# Patient Record
Sex: Female | Born: 1980 | Race: Black or African American | Hispanic: No | Marital: Married | Smoking: Former smoker
Health system: Southern US, Community
[De-identification: ages and names within clinical notes are randomized; demographics above are authoritative.]

## PROBLEM LIST (undated history)

## (undated) ENCOUNTER — Inpatient Hospital Stay (HOSPITAL_COMMUNITY): Payer: Self-pay

## (undated) ENCOUNTER — Emergency Department (HOSPITAL_COMMUNITY): Discharge status: Absent without leave | Payer: No Typology Code available for payment source

## (undated) DIAGNOSIS — R87619 Unspecified abnormal cytological findings in specimens from cervix uteri: Secondary | ICD-10-CM

## (undated) DIAGNOSIS — R519 Headache, unspecified: Secondary | ICD-10-CM

## (undated) DIAGNOSIS — G8929 Other chronic pain: Secondary | ICD-10-CM

## (undated) DIAGNOSIS — D649 Anemia, unspecified: Secondary | ICD-10-CM

## (undated) DIAGNOSIS — G43909 Migraine, unspecified, not intractable, without status migrainosus: Secondary | ICD-10-CM

## (undated) DIAGNOSIS — E162 Hypoglycemia, unspecified: Secondary | ICD-10-CM

## (undated) DIAGNOSIS — N12 Tubulo-interstitial nephritis, not specified as acute or chronic: Secondary | ICD-10-CM

## (undated) DIAGNOSIS — D573 Sickle-cell trait: Secondary | ICD-10-CM

## (undated) DIAGNOSIS — R51 Headache: Secondary | ICD-10-CM

## (undated) DIAGNOSIS — L0591 Pilonidal cyst without abscess: Secondary | ICD-10-CM

## (undated) HISTORY — PX: OTHER SURGICAL HISTORY: SHX169

## (undated) HISTORY — DX: Pilonidal cyst without abscess: L05.91

## (undated) HISTORY — PX: CERVICAL CONE BIOPSY: SUR198

---
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
## 2000-03-15 ENCOUNTER — Inpatient Hospital Stay (HOSPITAL_COMMUNITY): Admission: AD | Admit: 2000-03-15 | Discharge: 2000-03-15 | Payer: Self-pay | Admitting: Obstetrics

## 2000-03-15 ENCOUNTER — Encounter: Payer: Self-pay | Admitting: Obstetrics

## 2000-04-27 ENCOUNTER — Inpatient Hospital Stay (HOSPITAL_COMMUNITY): Admission: AD | Admit: 2000-04-27 | Discharge: 2000-04-27 | Payer: Self-pay | Admitting: Obstetrics & Gynecology

## 2000-06-26 ENCOUNTER — Inpatient Hospital Stay (HOSPITAL_COMMUNITY): Admission: AD | Admit: 2000-06-26 | Discharge: 2000-06-26 | Payer: Self-pay | Admitting: Obstetrics & Gynecology

## 2000-07-05 ENCOUNTER — Inpatient Hospital Stay (HOSPITAL_COMMUNITY): Admission: AD | Admit: 2000-07-05 | Discharge: 2000-07-05 | Payer: Self-pay | Admitting: Obstetrics

## 2000-11-11 ENCOUNTER — Inpatient Hospital Stay (HOSPITAL_COMMUNITY): Admission: AD | Admit: 2000-11-11 | Discharge: 2000-11-11 | Payer: Self-pay | Admitting: *Deleted

## 2001-12-19 ENCOUNTER — Emergency Department (HOSPITAL_COMMUNITY): Admission: EM | Admit: 2001-12-19 | Discharge: 2001-12-19 | Payer: Self-pay | Admitting: Emergency Medicine

## 2001-12-19 ENCOUNTER — Encounter: Payer: Self-pay | Admitting: Emergency Medicine

## 2002-02-13 ENCOUNTER — Emergency Department (HOSPITAL_COMMUNITY): Admission: EM | Admit: 2002-02-13 | Discharge: 2002-02-13 | Payer: Self-pay

## 2002-02-13 ENCOUNTER — Encounter: Payer: Self-pay | Admitting: Emergency Medicine

## 2003-10-29 ENCOUNTER — Emergency Department (HOSPITAL_COMMUNITY): Admission: EM | Admit: 2003-10-29 | Discharge: 2003-10-29 | Payer: Self-pay | Admitting: Family Medicine

## 2004-03-25 ENCOUNTER — Emergency Department (HOSPITAL_COMMUNITY): Admission: EM | Admit: 2004-03-25 | Discharge: 2004-03-25 | Payer: Self-pay | Admitting: Emergency Medicine

## 2005-03-20 ENCOUNTER — Emergency Department (HOSPITAL_COMMUNITY): Admission: EM | Admit: 2005-03-20 | Discharge: 2005-03-20 | Payer: Self-pay | Admitting: Emergency Medicine

## 2005-04-19 ENCOUNTER — Emergency Department (HOSPITAL_COMMUNITY): Admission: EM | Admit: 2005-04-19 | Discharge: 2005-04-19 | Payer: Self-pay | Admitting: Emergency Medicine

## 2006-05-23 HISTORY — PX: DILATION AND CURETTAGE OF UTERUS: SHX78

## 2006-08-11 ENCOUNTER — Emergency Department (HOSPITAL_COMMUNITY): Admission: EM | Admit: 2006-08-11 | Discharge: 2006-08-11 | Payer: Self-pay | Admitting: Emergency Medicine

## 2006-08-14 ENCOUNTER — Emergency Department (HOSPITAL_COMMUNITY): Admission: EM | Admit: 2006-08-14 | Discharge: 2006-08-14 | Payer: Self-pay | Admitting: *Deleted

## 2006-08-24 ENCOUNTER — Emergency Department (HOSPITAL_COMMUNITY): Admission: EM | Admit: 2006-08-24 | Discharge: 2006-08-24 | Payer: Self-pay | Admitting: Emergency Medicine

## 2006-08-27 ENCOUNTER — Emergency Department (HOSPITAL_COMMUNITY): Admission: EM | Admit: 2006-08-27 | Discharge: 2006-08-27 | Payer: Self-pay | Admitting: Emergency Medicine

## 2006-08-31 ENCOUNTER — Emergency Department (HOSPITAL_COMMUNITY): Admission: EM | Admit: 2006-08-31 | Discharge: 2006-08-31 | Payer: Self-pay | Admitting: Emergency Medicine

## 2006-09-03 ENCOUNTER — Emergency Department (HOSPITAL_COMMUNITY): Admission: EM | Admit: 2006-09-03 | Discharge: 2006-09-03 | Payer: Self-pay | Admitting: Emergency Medicine

## 2008-04-10 ENCOUNTER — Ambulatory Visit: Payer: Self-pay | Admitting: Obstetrics and Gynecology

## 2008-04-10 ENCOUNTER — Inpatient Hospital Stay (HOSPITAL_COMMUNITY): Admission: AD | Admit: 2008-04-10 | Discharge: 2008-04-12 | Payer: Self-pay | Admitting: Obstetrics and Gynecology

## 2009-05-23 HISTORY — PX: LEEP: SHX91

## 2010-10-22 HISTORY — PX: OTHER SURGICAL HISTORY: SHX169

## 2010-10-22 HISTORY — PX: PILONIDAL CYST EXCISION: SHX744

## 2011-02-22 LAB — CBC
HCT: 30 — ABNORMAL LOW
HCT: 34.2 — ABNORMAL LOW
Hemoglobin: 10.1 — ABNORMAL LOW
Hemoglobin: 11.1 — ABNORMAL LOW
MCHC: 32.4
MCHC: 33.7
MCV: 94.1
MCV: 94.5
Platelets: 194
Platelets: 207
RBC: 3.18 — ABNORMAL LOW
RBC: 3.63 — ABNORMAL LOW
RDW: 14.3
RDW: 14.5
WBC: 11 — ABNORMAL HIGH
WBC: 9.7

## 2011-02-22 LAB — RPR: RPR Ser Ql: NONREACTIVE

## 2011-04-13 ENCOUNTER — Emergency Department (HOSPITAL_COMMUNITY)

## 2011-04-13 ENCOUNTER — Inpatient Hospital Stay (HOSPITAL_COMMUNITY)
Admission: EM | Admit: 2011-04-13 | Discharge: 2011-04-14 | DRG: 779 | Disposition: A | Discharge status: Home / Self Care | Attending: Obstetrics & Gynecology | Admitting: Obstetrics & Gynecology

## 2011-04-13 ENCOUNTER — Other Ambulatory Visit: Payer: Self-pay | Admitting: Obstetrics and Gynecology

## 2011-04-13 ENCOUNTER — Encounter (HOSPITAL_COMMUNITY): Payer: Self-pay

## 2011-04-13 ENCOUNTER — Inpatient Hospital Stay (HOSPITAL_COMMUNITY)

## 2011-04-13 DIAGNOSIS — O42912 Preterm premature rupture of membranes, unspecified as to length of time between rupture and onset of labor, second trimester: Secondary | ICD-10-CM

## 2011-04-13 DIAGNOSIS — O2690 Pregnancy related conditions, unspecified, unspecified trimester: Secondary | ICD-10-CM

## 2011-04-13 DIAGNOSIS — O039 Complete or unspecified spontaneous abortion without complication: Principal | ICD-10-CM | POA: Diagnosis present

## 2011-04-13 DIAGNOSIS — O4290 Premature rupture of membranes, unspecified as to length of time between rupture and onset of labor, unspecified weeks of gestation: Secondary | ICD-10-CM

## 2011-04-13 HISTORY — DX: Anemia, unspecified: D64.9

## 2011-04-13 HISTORY — DX: Tubulo-interstitial nephritis, not specified as acute or chronic: N12

## 2011-04-13 HISTORY — DX: Hypoglycemia, unspecified: E16.2

## 2011-04-13 HISTORY — DX: Unspecified abnormal cytological findings in specimens from cervix uteri: R87.619

## 2011-04-13 HISTORY — DX: Other chronic pain: G89.29

## 2011-04-13 HISTORY — DX: Headache: R51

## 2011-04-13 HISTORY — DX: Headache, unspecified: R51.9

## 2011-04-13 LAB — BASIC METABOLIC PANEL
BUN: 5 mg/dL — ABNORMAL LOW (ref 6–23)
CO2: 24 mEq/L (ref 19–32)
Chloride: 98 mEq/L (ref 96–112)
Creatinine, Ser: 0.51 mg/dL (ref 0.50–1.10)
GFR calc Af Amer: >90 mL/min (ref >90)
Glucose, Bld: 67 mg/dL — ABNORMAL LOW (ref 70–99)
Potassium: 3.8 mEq/L (ref 3.5–5.1)

## 2011-04-13 LAB — TYPE AND SCREEN
ABO/RH(D): O POS
Antibody Screen: NEGATIVE

## 2011-04-13 LAB — WET PREP, GENITAL
Clue Cells Wet Prep HPF POC: NONE SEEN
Yeast Wet Prep HPF POC: NONE SEEN

## 2011-04-13 LAB — CBC
HCT: 35.4 % — ABNORMAL LOW (ref 36.0–46.0)
Hemoglobin: 12.3 g/dL (ref 12.0–15.0)
MCH: 31 pg (ref 26.0–34.0)
MCHC: 34.7 g/dL (ref 30.0–36.0)
Platelets: 260 10*3/uL (ref 150–400)
RBC: 3.97 MIL/uL (ref 3.87–5.11)
RDW: 13.3 % (ref 11.5–15.5)
WBC: 16.2 10*3/uL — ABNORMAL HIGH (ref 4.0–10.5)

## 2011-04-13 LAB — URINALYSIS, ROUTINE W REFLEX MICROSCOPIC
Glucose, UA: NEGATIVE mg/dL
Hgb urine dipstick: NEGATIVE
Ketones, ur: NEGATIVE mg/dL
Nitrite: NEGATIVE
Protein, ur: NEGATIVE mg/dL
Specific Gravity, Urine: 1.013 (ref 1.005–1.030)
Urobilinogen, UA: 0.2 mg/dL (ref 0.0–1.0)
pH: 7 (ref 5.0–8.0)

## 2011-04-13 LAB — URINE MICROSCOPIC-ADD ON

## 2011-04-13 LAB — DIFFERENTIAL
Basophils Absolute: 0 10*3/uL (ref 0.0–0.1)
Basophils Relative: 0 % (ref 0–1)
Eosinophils Absolute: 0 10*3/uL (ref 0.0–0.7)
Eosinophils Relative: 0 % (ref 0–5)
Lymphocytes Relative: 8 % — ABNORMAL LOW (ref 12–46)
Lymphs Abs: 1.4 10*3/uL (ref 0.7–4.0)
Monocytes Absolute: 0.6 10*3/uL (ref 0.1–1.0)
Neutro Abs: 14.2 10*3/uL — ABNORMAL HIGH (ref 1.7–7.7)
Neutrophils Relative %: 88 % — ABNORMAL HIGH (ref 43–77)

## 2011-04-13 LAB — RAPID URINE DRUG SCREEN, HOSP PERFORMED
Amphetamines: NOT DETECTED
Barbiturates: NOT DETECTED
Benzodiazepines: NOT DETECTED
Cocaine: NOT DETECTED
Opiates: NOT DETECTED
Tetrahydrocannabinol: NOT DETECTED

## 2011-04-13 MED ORDER — KETOROLAC TROMETHAMINE 30 MG/ML IJ SOLN
30.0000 mg | Freq: Once | INTRAMUSCULAR | Status: AC
  Administered 2011-04-13: 30 mg via INTRAVENOUS
  Filled 2011-04-13: qty 1

## 2011-04-13 MED ORDER — LACTATED RINGERS IV SOLN
500.0000 mL | Freq: Once | INTRAVENOUS | Status: AC
  Administered 2011-04-13: 500 mL via INTRAVENOUS

## 2011-04-13 MED ORDER — SODIUM CHLORIDE 0.9 % IV SOLN
1000.0000 mL | INTRAVENOUS | Status: AC
  Administered 2011-04-13: 1000 mL via INTRAVENOUS

## 2011-04-13 MED ORDER — AMPICILLIN SODIUM 2 G IJ SOLR
2.0000 g | Freq: Four times a day (QID) | INTRAMUSCULAR | Status: DC
  Administered 2011-04-13 (×2): 2 g via INTRAVENOUS
  Filled 2011-04-13 (×9): qty 2000

## 2011-04-13 MED ORDER — LIDOCAINE HCL (PF) 1 % IJ SOLN: 30.0000 mL | INTRAMUSCULAR | Status: DC | PRN

## 2011-04-13 MED ORDER — ONDANSETRON HCL 4 MG/2ML IJ SOLN: 4.0000 mg | Freq: Four times a day (QID) | INTRAMUSCULAR | Status: DC | PRN

## 2011-04-13 MED ORDER — LACTATED RINGERS IV SOLN: 500.0000 mL | INTRAVENOUS | Status: DC | PRN

## 2011-04-13 MED ORDER — LACTATED RINGERS IV SOLN
INTRAVENOUS | Status: DC
  Administered 2011-04-13: 20:00:00 via INTRAVENOUS

## 2011-04-13 MED ORDER — PHENYLEPHRINE 40 MCG/ML (10ML) SYRINGE FOR IV PUSH (FOR BLOOD PRESSURE SUPPORT): 80.0000 ug | PREFILLED_SYRINGE | INTRAVENOUS | Status: DC | PRN

## 2011-04-13 MED ORDER — MISOPROSTOL 200 MCG PO TABS
800.0000 ug | ORAL_TABLET | Freq: Once | ORAL | Status: AC
  Administered 2011-04-13: 800 ug via VAGINAL
  Filled 2011-04-13: qty 4

## 2011-04-13 MED ORDER — DIPHENHYDRAMINE HCL 50 MG/ML IJ SOLN: 12.5000 mg | INTRAMUSCULAR | Status: DC | PRN

## 2011-04-13 MED ORDER — FENTANYL 2.5 MCG/ML BUPIVACAINE 1/10 % EPIDURAL INFUSION (WH - ANES): 14.0000 mL/h | INTRAMUSCULAR | Status: DC

## 2011-04-13 MED ORDER — IBUPROFEN 600 MG PO TABS
600.0000 mg | ORAL_TABLET | Freq: Four times a day (QID) | ORAL | Status: DC | PRN
  Administered 2011-04-13: 600 mg via ORAL
  Filled 2011-04-13: qty 1

## 2011-04-13 MED ORDER — CITRIC ACID-SODIUM CITRATE 334-500 MG/5ML PO SOLN: 30.0000 mL | ORAL | Status: DC | PRN

## 2011-04-13 MED ORDER — OXYTOCIN 20 UNITS IN LACTATED RINGERS INFUSION - SIMPLE
125.0000 mL/h | Freq: Once | INTRAVENOUS | Status: AC
  Administered 2011-04-13: 125 mL/h via INTRAVENOUS

## 2011-04-13 MED ORDER — DEXTROSE 5 % IV SOLN
130.0000 mg | Freq: Three times a day (TID) | INTRAVENOUS | Status: DC
  Administered 2011-04-13 (×2): 130 mg via INTRAVENOUS
  Filled 2011-04-13 (×7): qty 3.25

## 2011-04-13 MED ORDER — SODIUM CHLORIDE 0.9 % IV BOLUS (SEPSIS)
1000.0000 mL | Freq: Once | INTRAVENOUS | Status: AC
  Administered 2011-04-13: 1000 mL via INTRAVENOUS

## 2011-04-13 MED ORDER — EPHEDRINE 5 MG/ML INJ: 10.0000 mg | INTRAVENOUS | Status: DC | PRN

## 2011-04-13 MED ORDER — PROMETHAZINE HCL 25 MG/ML IJ SOLN
12.5000 mg | INTRAMUSCULAR | Status: DC | PRN
  Administered 2011-04-13: 12.5 mg via INTRAVENOUS
  Filled 2011-04-13 (×2): qty 1

## 2011-04-13 MED ORDER — BUTORPHANOL TARTRATE 2 MG/ML IJ SOLN
1.0000 mg | Freq: Once | INTRAMUSCULAR | Status: DC
  Filled 2011-04-13: qty 1

## 2011-04-13 MED ORDER — OXYTOCIN BOLUS FROM INFUSION
500.0000 mL | Freq: Once | INTRAVENOUS | Status: DC
  Filled 2011-04-13: qty 1000
  Filled 2011-04-13: qty 500

## 2011-04-13 MED ORDER — MORPHINE SULFATE 4 MG/ML IJ SOLN
2.0000 mg | INTRAMUSCULAR | Status: DC | PRN
  Administered 2011-04-13 (×2): 2 mg via INTRAVENOUS
  Filled 2011-04-13 (×2): qty 1

## 2011-04-13 MED ORDER — ACETAMINOPHEN 325 MG PO TABS
650.0000 mg | ORAL_TABLET | ORAL | Status: DC | PRN
  Administered 2011-04-13: 650 mg via ORAL
  Filled 2011-04-13: qty 2

## 2011-04-13 NOTE — Progress Notes (Signed)
Received call from Diane RN at [facility] at [phone].  Arrived to [facility] at 1000.  Pt in room with Dr Fonnie Jarvis performing BSUS.  FHT 160's on [place].

## 2011-04-13 NOTE — Progress Notes (Signed)
Pt transferred to [facility]U via [facility] in stable condition.

## 2011-04-13 NOTE — ED Notes (Signed)
Patient will be transported to [facility]. [facility] had been called and OB Rapid RN is giving report to the [facility] RN.

## 2011-04-13 NOTE — ED Notes (Signed)
Patient has been transported to [facility] by [facility]

## 2011-04-13 NOTE — H&P (Signed)
Margaret Cameron is a 30 y.o. female presenting for Leaking fluid and severe abdominal pain. Maternal Medical History:  Reason for admission: Reason for admission: rupture of membranes and nausea.   This 30 year old female is gravida 2 para 1 with a prior term vaginal delivery who states her last menstrual period was the end of July with some vaginal spotting and tissue passage sometime in October and she thought she had a miscarriage after having a positive pregnancy test at the end of July, she now presents with several hours of constant clear discharge from her vaginal area with constant suprapubic and left lower quadrant abdominal pain without radiation or associated symptoms such as fever nausea chest pain cough or shortness of breath. She states in the last couple of months she has had a small amount of nonbloody vomiting and diarrhea every couple of days. She does have some dysuria today. She has no vaginal bleeding, syncope, or trauma. Her blood type is O+.   Past medical history of sickle cell trait   Patient was seen by Dr Fonnie Jarvis at [place] and subsequently transferred her per my recommendation, to confirm PPROM, which I suspected. [place] here reveals No fluid around baby and gestational age of 16.5 weeks.  Had an episode of bleeding in October and thought she had miscarried.    OB History    Grav Para Term Preterm Abortions TAB SAB Ect Mult Living   3 1 1  0 1 1 0 0 0 1     Past Medical History  Diagnosis Date  . Pyelonephritis     x 3 episodes  . Abnormal Pap smear of cervix     LEEP 2011  . Hypoglycemia   . Anemia     with pregnancy  . Chronic headaches    Past Surgical History  Procedure Date  . Dilation and curettage of uterus 2008  . Leep 2011  . Other surgical history     Pynlodial cyst removed from tail bone 2012   Family History: family history is not on file. Social History:  reports that she has been smoking Cigarettes.  She has been smoking about .3 packs per day. She  does not have any smokeless tobacco history on file. She reports that she does not drink alcohol or use illicit drugs. Just transferred from [employer] into [employer].  Review of Systems  Constitutional: Positive for fever, chills and malaise/fatigue.  Gastrointestinal: Positive for nausea and abdominal pain.  Neurological: Positive for weakness.      Blood pressure 108/55, pulse 102, temperature 98.4 F (36.9 C), temperature source Oral, resp. rate 20, height 5\' 6"  (1.676 m), last menstrual period 11/10/2010, SpO2 96.00%. Maternal Exam:  Uterine Assessment: Contraction strength is mild.  Abdomen: Patient reports generalized tenderness.  Fundal height is 17-18.   Fetal presentation: no presenting part  Introitus: Normal vulva. Vagina is positive for vaginal discharge.  Ferning test: not done.  Nitrazine test: not done. Amniotic fluid character: foul-smelling.  Pelvis: adequate for delivery.   Cervix: Cervix evaluated by digital exam.     Fetal Exam Fetal Monitor Review: Mode: ultrasound.       Physical Exam  Constitutional: She is oriented to person, place, and time. She appears well-developed and well-nourished. No distress.  HENT:  Head: Normocephalic.  Cardiovascular: Normal rate.   Respiratory: Effort normal.  GI: Soft. She exhibits no distension and no mass. There is generalized tenderness. There is guarding. There is no rebound.  Genitourinary: Uterus normal.  Vaginal discharge found.       No fluid in vagina, foul odor noted. Cervix closed/50%, cultures obtained.   Musculoskeletal: Normal range of motion.  Neurological: She is alert and oriented to person, place, and time.  Skin: Skin is warm and dry.  Psychiatric: She has a normal mood and affect.       Grieving appropriately   cultures obtained  Prenatal labs: ABO, Rh: --/--/O POS (11/21 1610) Antibody: NEG (11/21 0936) Rubella:   RPR:    HBsAg:    HIV:    GBS:     US shows Anhydramnios  with FHR 172.  Anatomy grossly normal  Assessment/Plan: A:  IUP at 16.5 weeks     PPROM P:  Consulted Dr Debroah Loop      Admit to [facility] for Cytotec Induction of labor      Amp and Gent IV antibiotics      Risks and benefits discussed with patient who agrees to proceed.  Wynelle Bourgeois 04/13/2011, 2:11 PM

## 2011-04-13 NOTE — Progress Notes (Signed)
Pt arrived by [facility] from [facility], pt C/O LLQ pain that she rates an 8, states it began last night & has become more intense today.  Pt also reports ? Gushes of water x 2, clear.

## 2011-04-13 NOTE — Progress Notes (Signed)
CNM Williams notified of lab results to include CBC and UA.  Orders received.

## 2011-04-13 NOTE — Progress Notes (Signed)
Educating pt on miscarriage and plan at delivery. Pt understands and has no questions.

## 2011-04-13 NOTE — ED Provider Notes (Signed)
History     CSN: [id number] Arrival date & time: 04/13/2011  8:55 AM   First MD Initiated Contact with Patient 04/13/11 0935      Chief Complaint  Patient presents with  . Abdominal Pain  . Possible Pregnancy  . Urinary Incontinence  . Rupture of Membranes    (Consider location/radiation/quality/duration/timing/severity/associated sxs/prior treatment) HPI This 30 year old female is gravida 2 para 1 with a prior term vaginal delivery who states her last menstrual period was the end of July with some vaginal spotting and tissue passage sometime in October and she thought she had a miscarriage after having a positive pregnancy test at the end of July, she now presents with several hours of constant clear discharge from her vaginal area with constant suprapubic and left lower quadrant abdominal pain without radiation or associated symptoms such as fever nausea chest pain cough or shortness of breath. She states in the last couple of months she has had a small amount of nonbloody vomiting and diarrhea every couple of days. She does have some dysuria today. She has no vaginal bleeding, syncope, or trauma. Her blood type is O+. Past Medical History  Diagnosis Date  . Pyelonephritis     x 3 episodes  . Abnormal Pap smear of cervix     LEEP 2011  . Hypoglycemia   . Anemia     with pregnancy  . Chronic headaches    Past medical history of sickle cell trait Past Surgical History  Procedure Date  . Dilation and curettage of uterus 2008  . Leep 2011  . Other surgical history     Pynlodial cyst removed from tail bone 2012    History reviewed. No pertinent family history.  History  Substance Use Topics  . Smoking status: Current Everyday Smoker -- 0.3 packs/day    Types: Cigarettes  . Smokeless tobacco: Not on file  . Alcohol Use: No    OB History    Grav Para Term Preterm Abortions TAB SAB Ect Mult Living   3 2 1  0 1 1 0 0 0 1      Review of Systems  Constitutional:  Negative for fever.       10 Systems reviewed and are negative for acute change except as noted in the HPI.  HENT: Negative for congestion.   Eyes: Negative for discharge and redness.  Respiratory: Negative for cough and shortness of breath.   Cardiovascular: Negative for chest pain.  Gastrointestinal: Positive for vomiting, abdominal pain and diarrhea.  Genitourinary: Positive for dysuria. Negative for vaginal bleeding.  Musculoskeletal: Negative for back pain.  Skin: Negative for rash.  Neurological: Negative for syncope, numbness and headaches.  Psychiatric/Behavioral:       No behavior change.    Allergies  Codeine and Septra  Home Medications   Current Outpatient Rx  Name Route Sig Dispense Refill  . IBUPROFEN 600 MG PO TABS Oral Take 1 tablet (600 mg total) by mouth every 6 (six) hours as needed for pain. Takes for pain 30 tablet 1  . PRENATAL VITAMINS 0.8 MG PO TABS Oral Take 1 tablet by mouth daily. 30 tablet 12    BP 91/52  Pulse 80  Temp(Src) 98.2 F (36.8 C) (Oral)  Resp 18  Ht 5\' 6"  (1.676 m)  Wt 158 lb 9.6 oz (71.94 kg)  BMI 25.60 kg/m2  SpO2 100%  LMP 11/10/2010  Physical Exam  Nursing note and vitals reviewed. Constitutional:       Awake, alert, nontoxic  appearance.  HENT:  Head: Atraumatic.  Eyes: Right eye exhibits no discharge. Left eye exhibits no discharge.  Neck: Neck supple.  Cardiovascular: Regular rhythm.   No murmur heard.      Tachycardic  Pulmonary/Chest: Effort normal and breath sounds normal. She exhibits no tenderness.  Abdominal: Soft. Bowel sounds are normal. There is tenderness. There is no rebound.       Mild to moderate suprapubic and left lower quadrant tenderness without rebound with limited bedside emergency department ultrasound revealing an intrauterine pregnancy with fetal heart rate 160, with transverse lie, with head position to the right side, with biparietal diameter estimated gestational age at approximately 18 weeks with  minimal amniotic fluid  Genitourinary:       Chaperone present for bimanual sterile glove examination with clear discharge with soft closed cervix with minimal tenderness without bleeding noted  Musculoskeletal: Normal range of motion. She exhibits no edema and no tenderness.       Baseline ROM, no obvious new focal weakness.  Neurological: She is alert.       Mental status and motor strength appears baseline for patient and situation.  Skin: No rash noted.  Psychiatric: She has a normal mood and affect.    ED Course  Procedures (including critical care time) The patient was evaluated by the OB rapid response nurse in the emergency department who communicated with Dr. Debroah Loop at [facility] who accepts the patient in transfer to the [facility] maternal admissions unit for further evaluation for suspected premature rupture of membranes with pregnancy complication with abdominal pain. The ultrasound was canceled for [facility] and will likely be performed at [facility]. Labs Reviewed  CBC - Abnormal; Notable for the following:    WBC 16.2 (*)    HCT 35.4 (*)    All other components within normal limits  DIFFERENTIAL - Abnormal; Notable for the following:    Neutrophils Relative 88 (*)    Neutro Abs 14.2 (*)    Lymphocytes Relative 8 (*)    All other components within normal limits  BASIC METABOLIC PANEL - Abnormal; Notable for the following:    Sodium 133 (*)    Glucose, Bld 67 (*)    BUN 5 (*)    All other components within normal limits  URINALYSIS, ROUTINE W REFLEX MICROSCOPIC - Abnormal; Notable for the following:    Leukocytes, UA SMALL (*)    All other components within normal limits  URINE MICROSCOPIC-ADD ON - Abnormal; Notable for the following:    Squamous Epithelial / LPF FEW (*)    Bacteria, UA FEW (*)    All other components within normal limits  WET PREP, GENITAL - Abnormal; Notable for the following:    Trich, Wet Prep FEW (*)    WBC, Wet  Prep HPF POC MODERATE (*) MODERATE BACTERIA SEEN   All other components within normal limits  TYPE AND SCREEN  ABO/RH  GC/CHLAMYDIA PROBE AMP, GENITAL  URINE RAPID DRUG SCREEN (HOSP PERFORMED)  LAB REPORT - SCANNED   US Ob Comp + 14 Wk  04/13/2011  OBSTETRICAL ULTRASOUND: This exam was performed within a [place] [facility] Department. The OB US report was generated in the [facility] system, and faxed to the ordering physician.   This report is also available in [employer] and in the [employer]. See [facility] Obstetric US report.     1. Preterm premature rupture of membranes in second trimester   2. Abdominal pain  3. Pregnancy complication       MDM  The patient appears reasonably stabilized for transfer considering the current resources, flow, and capabilities available in the ED at this time, and I doubt any other [facility] requiring further screening and/or treatment in the ED prior to transfer.        Hurman Horn, MD 04/14/11 [phone]

## 2011-04-13 NOTE — Progress Notes (Addendum)
G3P1.  No PNC.  Dating per pt's reported LMP of 11/10/2010, would be at 22.0 GA.  Fundal height is approx 17.5 cm upon measurement.  Dop tones are 160's.  Pt reports a prior  SVD at [redacted] wks GA with no complications and an TAB via D&C in 2008.  Pt reports no other OB med problems.  Pt has NPC this pregnancy.  Pt states she had a positive UPT in 11/2010.  In Pt states she had a large episode of painless bleeding where she passed multiple large golf ball size clots but no tissue in early October.  Pt reports she did not seek medical care for this episode of bleeding because she believed that she had had a complete miscarriage.  Pt states she had an episode where she had a large gush of clear vaginal fluid last night at around 2000.  She stated she did not leak thoughout the night, but  she did have several gushes this morning.  Pt states she has had constant, sharp LLQ pain that is stabbing in nature at 10/10 on PS.  She also states she has suprapubic "pressure like " pain that is constant in nature.  Upon palpation, pt's abdomen is soft, but there is diffuse tenderness throughout her abdomen.  Pt has positive CVA tenderness bilaterally upon percussion.  Pt feels warm to touch, but [facility] RN reports she was afebrile upon admission.  Tachycardia present in the 105-111 range.  Temp per RROB 100.8 oral, at this time.  Pt reports that she has had irregular bouts of dysuria for the past several days coupled with both frequency and hesitancy.  Pt currently demonstrating chills and has had an episode of emesis since OBRR arrived.  Pt states she has had nausea since October, but that this is the first time she has vomited today.  CCUA, CBC, and comp chem obtained and sent.  Upon visualization of perineum, no active bleeding or LOF noted.  Fluid present on pt's panties is Nitrazine positive.  Per dr Fonnie Jarvis, SVE closed and high.  SVE deferred by RN at this time.  Pt's med hx:  Frequent episodes of pyelo, h/o abnormal pap with LEEP,  h/o symptomatic hypoglycemia, h/o chronic HAs, h/o anemia with pregnancy.  See History scection for full details.

## 2011-04-13 NOTE — ED Notes (Signed)
Patient reports that she had positive pregnancy back in July and developed bleeding in august and thought she had miscarriage. Has now developed increased abdominal pain with bleeding and urinary incontinence. Took additional preg test that was positive

## 2011-04-13 NOTE — ED Notes (Signed)
Secondary Assessment: pt was got pregnant in August. Had 1 day of profusely bleeding with many clots in October but didn't seek medical attention due to lack of insurance coverage at that time. Pt suspected miscarriage. Recently pt took a pregnancy test and shown positive result. Yesterday pt started to have sharp left abd pain 10/10. No bleeding at this moment, also experiencing urinary incontinence since yesterday, also have nausea and vomiting.

## 2011-04-13 NOTE — Progress Notes (Addendum)
Spoke with CNM Mayford Knife regarding patient, as Dr Debroah Loop is unavailable at this time.  CNM Williams updated re pt presence, status, presenting complaint, OBRR assessment, VS, Dop tones and FHT on [place], SVE per Dr Fonnie Jarvis, previous episode of painless bldg in OCT, OB and med hx.  Order to transfer pt to [facility] for further evaluation received.  Dr Fonnie Jarvis, [facility], and [facility]U notified.

## 2011-04-13 NOTE — Brief Op Note (Signed)
*   No surgery found * Delivery Note At 8:44 PM a non-viable female was delivered via Vaginal, Spontaneous Delivery (Presentation: vertex;  ).  APGAR: 0, 0; weight . Baby was stillborn. Placenta was initially retained and after approximately 20 minutes expelled spontaneously and response to IV oxytocin  Placenta status: Intact, Spontaneous.  Cord:  with the following complications: .   Anesthesia iv analgesia :   Episiotomy: None Lacerations: None  Est. Blood Loss (mL): 250 cc  Mom to gyn floor. She will receive 2 more doses of antibiotic as already scheduled, gentamicin and 1 hour and sewing in 2 hours prior to discontinuation of this medication probable discharge to home  Baby to morgue, Family DEsires Autopsy. Baby viewed by patient and father of baby Tilda Burrow 04/13/2011, 10:31 PM

## 2011-04-13 NOTE — Progress Notes (Signed)
ANTIBIOTIC CONSULT NOTE - INITIAL  Pharmacy Consult for Gentamicin Indication: Chorioamnionitis, Maternal Fever, Endometritis  Allergies  Allergen Reactions  . Codeine Rash    Mild rash, took once with benadryl and NO rash, Took Morphine with NO side effect  . Septra (Bactrim) Rash    Patient Measurements: Height: 5\' 6"  (167.6 cm) IBW/kg (Calculated) : 59.3  Adjusted Body Weight: 62.6kg  Vital Signs: Temp: 98.4 F (36.9 C) (11/21 1153) Temp src: Oral (11/21 1153) BP: 108/55 mmHg (11/21 1208) Pulse Rate: 102  (11/21 1208)  Labs:  [facility] 04/13/11 0936  WBC 16.2*  HGB 12.3  PLT 260  LABCREA --  CREATININE 0.51  CRCLEARANCE --   No results found for this basename: GENTTROUGH:2,GENTPEAK:2,GENTRANDOM:2, in the last 72 hours   Microbiology: No results found for this or any previous visit (from the past 720 hour(s)).  Medications:  Ampicillin 2grams IV every 6 hours.  Assessment: Pt about [redacted] weeks pregnant with chorioamnionitis. Estimated Ke = 0.445 , Vd = 0.3L/Kg  Goal of Therapy:  Gentamicin peak 6-8 mg/L and Trough < 1 mg/L  Plan:  Gentamicin 130 mg IV every 8 hrs  Will check gentamicin levels if continued > 72hr or clinically indicated.  Berlin Hun D 04/13/2011,2:01 PM

## 2011-04-14 ENCOUNTER — Encounter (HOSPITAL_COMMUNITY): Payer: Self-pay | Admitting: *Deleted

## 2011-04-14 MED ORDER — PRENATAL VITAMINS 0.8 MG PO TABS: 1.0000 | ORAL_TABLET | Freq: Every day | ORAL | Status: AC

## 2011-04-14 MED ORDER — IBUPROFEN 600 MG PO TABS: 600.0000 mg | ORAL_TABLET | Freq: Four times a day (QID) | ORAL | Status: DC | PRN

## 2011-04-14 MED ORDER — SODIUM CHLORIDE 0.9 % IV SOLN
Freq: Every day | INTRAVENOUS | Status: DC | PRN
  Administered 2011-04-14: 01:00:00 via INTRAVENOUS

## 2011-04-14 MED ORDER — SODIUM CHLORIDE 0.9 % IV SOLN
2.0000 g | Freq: Four times a day (QID) | INTRAVENOUS | Status: AC
  Administered 2011-04-14: 2 g via INTRAVENOUS
  Filled 2011-04-14: qty 2000

## 2011-04-14 NOTE — Progress Notes (Signed)
Subjective: Patient reports tolerating PO, + flatus and no problems voiding.  Mild cramping pain  Objective: I have reviewed patient's vital signs and intake and output.  General: alert, cooperative and no distress Resp: clear to auscultation bilaterally Cardio: regular rate and rhythm GI: soft, non-tender; bowel sounds normal; no masses,  no organomegaly Extremities: no edema, redness or tenderness in the calves or thighs Lochia- min-moderate  Assessment/Plan: 30yo G3P1011 with PPROM at 16 wks s/p induced abortion - Patient hemodynamically stable for discharge - Discharge instructions provided  LOS: 1 day    Margaret Cameron 04/14/2011, 9:27 AM

## 2011-04-14 NOTE — Progress Notes (Signed)
Nursing: Pt denies chaplin services.

## 2011-04-14 NOTE — Discharge Summary (Signed)
Physician Discharge Summary  Patient ID: Celia Friedland MRN: [id number] DOB/AGE: 02-01-1981 30 y.o.  Admit date: 04/13/2011 Discharge date: 04/14/2011  Admission Diagnoses: 16wk PPROM  Discharge Diagnoses: same Active Problems:  Complete legally induced abortion   Discharged Condition: stable  Hospital Course: 30 yo G3P1011 at [redacted]w[redacted]d presented with PPROM and chorioamnionitis. Patient was counseled for need to induce delivery and received Cytotec per vagina. Patient delivered both fetus and placenta with complication. She received one dose of ampicillin and gentamycin. On PPD#1, patient was found to be stable for discharge. Discharge instructions were provided and patient was asked to f/u in gyn clinic. Patient was asked to wait 3 normal cycles before trying to conceive. Patient was given a prescription for prenatal vitamins.  Consults: none  Significant Diagnostic Studies: labs: wbc on admission 16  Treatments: antibiotics: gentamycin and ampicillin; induced abortion with cytotec  Discharge Exam: Blood pressure 91/52, pulse 80, temperature 98.2 F (36.8 C), temperature source Oral, resp. rate 18, height 5\' 6"  (1.676 m), weight 71.94 kg (158 lb 9.6 oz), last menstrual period 11/10/2010, SpO2 100.00%. General appearance: alert, cooperative and no distress Resp: clear to auscultation bilaterally Cardio: regular rate and rhythm GI: soft, non-tender; bowel sounds normal; no masses,  no organomegaly Extremities: no edema, redness or tenderness in the calves or thighs  Disposition:    Current Discharge Medication List    START taking these medications   Details  Prenatal Multivit-Min-Fe-FA (PRENATAL VITAMINS) 0.8 MG tablet Take 1 tablet by mouth daily. Qty: 30 tablet, Refills: 12      CONTINUE these medications which have CHANGED   Details  ibuprofen (ADVIL,MOTRIN) 600 MG tablet Take 1 tablet (600 mg total) by mouth every 6 (six) hours as needed for pain. Takes for pain Qty: 30  tablet, Refills: 1       Follow-up Information    Make an appointment with [facility]. [phone] call to schedule appointment in 2-3 weeks)    Contact information:   [address] [place] [address]          Signed: Catalina Antigua 04/14/2011, 9:35 AM

## 2011-04-14 NOTE — Progress Notes (Signed)
Chaplain's note:  Chaplain called nurse to check if pt would like visit from chaplain.  Pt declined need for visit.  Will continue to follow if requested. [employer]   [phone]

## 2011-04-14 NOTE — Progress Notes (Signed)
Patient instructed to bear down with next cramp. Baby delivered at 2044.

## 2011-04-14 NOTE — Progress Notes (Signed)
Dr. Emelda Fear gowned/gloved attending to recent delivery.  Patient very uncomfortable, needing pain meds. Verbal order taken for Stadol by Dr. Emelda Fear

## 2011-04-19 ENCOUNTER — Telehealth: Payer: Self-pay | Admitting: *Deleted

## 2011-04-19 MED ORDER — METRONIDAZOLE 500 MG PO TABS: ORAL_TABLET | ORAL | Status: DC

## 2011-04-19 NOTE — Telephone Encounter (Signed)
Called pt and heard message stating that the person called is unavailable right now.  Letter will be sent to pt re: test results, Rx needed for trich, partner needs to be treated and appt. for follow up in clinic 12/6-12/14.   ( 16 wk termination of preg due to chorio )

## 2011-04-19 NOTE — Telephone Encounter (Signed)
Message copied by Jill Side on Tue Apr 19, 2011  8:51 AM ------      Message from: [employer] Signs      Created: Sat Apr 16, 2011 11:31 AM      Regarding: Needs treatment letter, phone not working       Pt here 11/21 and had 16 week miscarriage.            + Trich on wet prep, not treated during hospital stay            Needs Rx for Flagyl 2gm PO x 1            Phone not connected.             She should be following up in our clinc postpartum

## 2011-04-20 ENCOUNTER — Encounter: Payer: Self-pay | Admitting: *Deleted

## 2011-04-20 NOTE — Telephone Encounter (Signed)
Certified letter with results and prescription for treatment mailed.  Also noted appointment scheduled for 05/18/11 at 3:30 pm.  Asked patient to call if she will be unable to keep appointment.  Certified mail tracking number [phone].

## 2011-04-23 DEATH — deceased

## 2011-04-27 ENCOUNTER — Encounter: Payer: Self-pay | Admitting: Obstetrics & Gynecology

## 2011-04-27 ENCOUNTER — Encounter: Payer: Self-pay | Admitting: *Deleted

## 2011-04-28 ENCOUNTER — Ambulatory Visit: Admitting: Family Medicine

## 2011-05-18 ENCOUNTER — Encounter: Payer: Self-pay | Admitting: Obstetrics and Gynecology

## 2011-05-18 ENCOUNTER — Ambulatory Visit (INDEPENDENT_AMBULATORY_CARE_PROVIDER_SITE_OTHER): Admitting: Obstetrics and Gynecology

## 2011-05-18 ENCOUNTER — Ambulatory Visit: Admitting: Obstetrics and Gynecology

## 2011-05-18 DIAGNOSIS — N898 Other specified noninflammatory disorders of vagina: Secondary | ICD-10-CM

## 2011-05-18 MED ORDER — METRONIDAZOLE 500 MG PO TABS: ORAL_TABLET | ORAL | Status: DC

## 2011-05-18 MED ORDER — NORETHINDRONE ACET-ETHINYL EST 1-20 MG-MCG PO TABS: 1.0000 | ORAL_TABLET | Freq: Every day | ORAL | Status: DC

## 2011-05-18 MED ORDER — INFLUENZA VIRUS VACC SPLIT PF IM SUSP: 0.5000 mL | INTRAMUSCULAR | Status: AC

## 2011-05-18 NOTE — Progress Notes (Signed)
Pt states her mother-in-law obtained the certified letter but has not given it to her yet- pt has not received the RX for Flagyl for trich as previously prescribed.

## 2011-05-18 NOTE — Progress Notes (Signed)
Margaret Shaffer30 y.o.[id number] @Unknown  Chief Complaint  Patient presents with  . Follow-up    miscarriage due to PPROM and chorio  @ 16w 5d    SUBJECTIVE  HPI: The patient underwent legally induced abortion with Cytotec on 04/13/2011 when she was 16 weeks 5 days premature preterm rupture of membranes with chorioamnionitis. She did receive a course of IV ampicillin and gentamicin. He was discharged on the following day. Her surgical pathology shows chorioamnionitis and fetal membranes and normal anatomy of the female fetus. Smokes 2 cigarettes per day and trying to quit. Was sent rx for Flagyl dx last month but did not receive it and unaware of dx.  Past Medical History  Diagnosis Date  . Pyelonephritis     x 3 episodes  . Abnormal Pap smear of cervix     LEEP 2011  . Hypoglycemia   . Anemia     with pregnancy  . Chronic headaches    Past Surgical History  Procedure Date  . Dilation and curettage of uterus 2008  . Leep 2011  . 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 Marital Status: Married    Spouse Name: N/A    Number of Children: N/A  . Years of Education: N/A   Occupational History  . Not on file.   Social History Main Topics  . Smoking status: Current Everyday Smoker -- 0.3 packs/day    Types: Cigarettes  . Smokeless tobacco: Not on file  . Alcohol Use: No  . Drug Use: No  . Sexually Active: Yes   Other Topics Concern  . Not on file   Social History Narrative  . No narrative on file   Current Outpatient Prescriptions on File Prior to Visit  Medication Sig Dispense Refill  . Prenatal Multivit-Min-Fe-FA (PRENATAL VITAMINS) 0.8 MG tablet Take 1 tablet by mouth daily.  30 tablet  12  . ibuprofen (ADVIL,MOTRIN) 600 MG tablet Take 1 tablet (600 mg total) by mouth every 6 (six) hours as needed for pain. Takes for pain  30 tablet  1  . metroNIDAZOLE (FLAGYL) 500 MG tablet Take 4 tablets - single dose  4  tablet  0   Allergies  Allergen Reactions  . Codeine Rash    Mild rash, took once with benadryl and NO rash, Took Morphine with NO side effect  . Septra (Bactrim) Rash    ROS: Pertinent items in HPI  OBJECTIVE  BP 120/72  Pulse 79  Temp(Src) 97.6 F (36.4 C) (Oral)  Resp 20  Ht 5\' 6"  (1.676 m)  Wt 164 lb 1.6 oz (74.435 kg)  BMI 26.49 kg/m2  LMP 05/13/2011  Cx scant blood with exam, parous os, clean Uterus retroverted, slightly enlarged, NT, mobile ASSESSMENT 5 wks postpartum legally induced Ab at [redacted]w[redacted]d due to chorio /PPROM    PLAN  GC/CT sent Note for [employer] for recovery 90 days from 04/13/11 Rx Loestrin  And Flagyl Stop smoking

## 2011-05-18 NOTE — Patient Instructions (Addendum)
Smoking Cessation This document explains the best ways for you to quit smoking and new treatments to help. It lists new medicines that can double or triple your chances of quitting and quitting for good. It also considers ways to avoid relapses and concerns you may have about quitting, including weight gain. NICOTINE: A POWERFUL ADDICTION If you have tried to quit smoking, you know how hard it can be. It is hard because nicotine is a very addictive drug. For some people, it can be as addictive as heroin or cocaine. Usually, people make 2 or 3 tries, or more, before finally being able to quit. Each time you try to quit, you can learn about what helps and what hurts. Quitting takes hard work and a lot of effort, but you can quit smoking. QUITTING SMOKING IS ONE OF THE MOST IMPORTANT THINGS YOU WILL EVER DO.  You will live longer, feel better, and live better.   The impact on your body of quitting smoking is felt almost immediately:   Within 20 minutes, blood pressure decreases. Pulse returns to its normal level.   After 8 hours, carbon monoxide levels in the blood return to normal. Oxygen level increases.   After 24 hours, chance of heart attack starts to decrease. Breath, hair, and body stop smelling like smoke.   After 48 hours, damaged nerve endings begin to recover. Sense of taste and smell improve.   After 72 hours, the body is virtually free of nicotine. Bronchial tubes relax and breathing becomes easier.   After 2 to 12 weeks, lungs can hold more air. Exercise becomes easier and circulation improves.   Quitting will reduce your risk of having a heart attack, stroke, cancer, or lung disease:   After 1 year, the risk of coronary heart disease is cut in half.   After 5 years, the risk of stroke falls to the same as a nonsmoker.   After 10 years, the risk of lung cancer is cut in half and the risk of other cancers decreases significantly.   After 15 years, the risk of coronary heart  disease drops, usually to the level of a nonsmoker.   If you are pregnant, quitting smoking will improve your chances of having a healthy baby.   The people you live with, especially your children, will be healthier.   You will have extra money to spend on things other than cigarettes.  FIVE KEYS TO QUITTING Studies have shown that these 5 steps will help you quit smoking and quit for good. You have the best chances of quitting if you use them together: 1. Get ready.  2. Get support and encouragement.  3. Learn new skills and behaviors.  4. Get medicine to reduce your nicotine addiction and use it correctly.  5. Be prepared for relapse or difficult situations. Be determined to continue trying to quit, even if you do not succeed at first.  1. GET READY  Set a quit date.   Change your environment.   Get rid of ALL cigarettes, ashtrays, matches, and lighters in your home, car, and place of work.   Do not let people smoke in your home.   Review your past attempts to quit. Think about what worked and what did not.   Once you quit, do not smoke. NOT EVEN A PUFF!  2. GET SUPPORT AND ENCOURAGEMENT Studies have shown that you have a better chance of being successful if you have help. You can get support in many ways.  Tell   your family, friends, and coworkers that you are going to quit and need their support. Ask them not to smoke around you.   Talk to your caregivers (doctor, dentist, nurse, pharmacist, psychologist, and/or smoking counselor).   Get individual, group, or telephone counseling and support. The more counseling you have, the better your chances are of quitting. Programs are available at local hospitals and health centers. Call your local health department for information about programs in your area.   Spiritual beliefs and practices may help some smokers quit.   Quit meters are small computer programs online or downloadable that keep track of quit statistics, such as amount  of "quit-time," cigarettes not smoked, and money saved.   Many smokers find one or more of the many self-help books available useful in helping them quit and stay off tobacco.  3. LEARN NEW SKILLS AND BEHAVIORS  Try to distract yourself from urges to smoke. Talk to someone, go for a walk, or occupy your time with a task.   When you first try to quit, change your routine. Take a different route to work. Drink tea instead of coffee. Eat breakfast in a different place.   Do something to reduce your stress. Take a hot bath, exercise, or read a book.   Plan something enjoyable to do every day. Reward yourself for not smoking.   Explore interactive web-based programs that specialize in helping you quit.  4. GET MEDICINE AND USE IT CORRECTLY Medicines can help you stop smoking and decrease the urge to smoke. Combining medicine with the above behavioral methods and support can quadruple your chances of successfully quitting smoking. The [place] Food and Drug Administration (FDA) has approved 7 medicines to help you quit smoking. These medicines fall into 3 categories.  Nicotine replacement therapy (delivers nicotine to your body without the negative effects and risks of smoking):   Nicotine gum: Available over-the-counter.   Nicotine lozenges: Available over-the-counter.   Nicotine inhaler: Available by prescription.   Nicotine nasal spray: Available by prescription.   Nicotine skin patches (transdermal): Available by prescription and over-the-counter.   Antidepressant medicine (helps people abstain from smoking, but how this works is unknown):   Bupropion sustained-release (SR) tablets: Available by prescription.   Nicotinic receptor partial agonist (simulates the effect of nicotine in your brain):   Varenicline tartrate tablets: Available by prescription.   Ask your caregiver for advice about which medicines to use and how to use them. Carefully read the information on the package.    Everyone who is trying to quit may benefit from using a medicine. If you are pregnant or trying to become pregnant, nursing an infant, you are under age 18, or you smoke fewer than 10 cigarettes per day, talk to your caregiver before taking any nicotine replacement medicines.   You should stop using a nicotine replacement product and call your caregiver if you experience nausea, dizziness, weakness, vomiting, fast or irregular heartbeat, mouth problems with the lozenge or gum, or redness or swelling of the skin around the patch that does not go away.   Do not use any other product containing nicotine while using a nicotine replacement product.   Talk to your caregiver before using these products if you have diabetes, heart disease, asthma, stomach ulcers, you had a recent heart attack, you have high blood pressure that is not controlled with medicine, a history of irregular heartbeat, or you have been prescribed medicine to help you quit smoking.  5. BE PREPARED FOR RELAPSE OR   DIFFICULT SITUATIONS  Most relapses occur within the first 3 months after quitting. Do not be discouraged if you start smoking again. Remember, most people try several times before they finally quit.   You may have symptoms of withdrawal because your body is used to nicotine. You may crave cigarettes, be irritable, feel very hungry, cough often, get headaches, or have difficulty concentrating.   The withdrawal symptoms are only temporary. They are strongest when you first quit, but they will go away within 10 to 14 days.  Here are some difficult situations to watch for:  Alcohol. Avoid drinking alcohol. Drinking lowers your chances of successfully quitting.   Caffeine. Try to reduce the amount of caffeine you consume. It also lowers your chances of successfully quitting.   Other smokers. Being around smoking can make you want to smoke. Avoid smokers.   Weight gain. Many smokers will gain weight when they quit, usually  less than 10 pounds. Eat a healthy diet and stay active. Do not let weight gain distract you from your main goal, quitting smoking. Some medicines that help you quit smoking may also help delay weight gain. You can always lose the weight gained after you quit.   Bad mood or depression. There are a lot of ways to improve your mood other than smoking.  If you are having problems with any of these situations, talk to your caregiver. SPECIAL SITUATIONS AND CONDITIONS Studies suggest that everyone can quit smoking. Your situation or condition can give you a special reason to quit.  Pregnant women/new mothers: By quitting, you protect your baby's health and your own.   Hospitalized patients: By quitting, you reduce health problems and help healing.   Heart attack patients: By quitting, you reduce your risk of a second heart attack.   Lung, head, and neck cancer patients: By quitting, you reduce your chance of a second cancer.   Parents of children and adolescents: By quitting, you protect your children from illnesses caused by secondhand smoke.  QUESTIONS TO THINK ABOUT Think about the following questions before you try to stop smoking. You may want to talk about your answers with your caregiver.  Why do you want to quit?   If you tried to quit in the past, what helped and what did not?   What will be the most difficult situations for you after you quit? How will you plan to handle them?   Who can help you through the tough times? Your family? Friends? Caregiver?   What pleasures do you get from smoking? What ways can you still get pleasure if you quit?  Here are some questions to ask your caregiver:  How can you help me to be successful at quitting?   What medicine do you think would be best for me and how should I take it?   What should I do if I need more help?   What is smoking withdrawal like? How can I get information on withdrawal?  Quitting takes hard work and a lot of effort,  but you can quit smoking. FOR MORE INFORMATION  Smokefree.gov (http://www.davis-sullivan.com/) provides free, accurate, evidence-based information and professional assistance to help support the immediate and long-term needs of people trying to quit smoking. Document Released: 05/03/2001 Document Revised: 01/19/2011 Document Reviewed: 02/23/2009 [facility] Patient Information 2012 [place], [place].Oral Contraceptives Oral contraceptives (OCs) are medicines taken to prevent pregnancy. They are the most widely used method of birth control. OCs work by preventing the ovaries from releasing eggs. The OC  hormones also cause the mucus on the cervix to thicken, preventing the sperm from entering the uterus. They also cause the lining of the uterus to become thin, not allowing a fertilized egg to attach to the inside of the uterus. OCs have a failure rate of less than 1%, when taken exactly as prescribed. THERE ARE 2 TYPES OF OC  OC that contains a mix of estrogen and progesterone hormones is the most common OC used. It is taken for 21 days, followed by 7 days of not taking the OC hormones. It can be packaged as 28 pills, with the last 7 pills being inactive. You take a pill every day. This way you do not need to remember when to restart taking the active pills. Most women will begin their menstrual period 2 to 3 days after taking the hormone pill. The menstrual period is usually lighter and shorter. This combination OC should not be taken if you are breast-feeding.   The progesterone only (minipill) OC does not contain estrogen. It is taken every day, continuously. You may have only spotting for a period, or no period at all. The progesterone only OC can be taken if you are breast-feeding your baby.  OCs come in:  Packs of 21 pills, with no pills to take for 7 days after the last pill.   Packs of 28 pills, with a pill to take every day. The last 7 pills are without hormones.   Packs of 91 pills (continuous or  extended use), with a pill to take every day. The first 84 pills contain the hormones, and the last 7 pills do not. That is when you will have your menstrual period. You will not have a menstrual period during the time you are taking the first 84 pills.  HOW TO TAKE OC Your caregiver may advise you on how to start taking the first cycle of OCs. Otherwise, you can:  Start on day 1 or day 5 of your menstrual period, taking the first pack of the OC. You will not need any backup contraceptive protection with this start time.   Start on the first Sunday after your menstrual period, day 7 of your menstrual period, or the day you get your prescription. In these cases, you will need backup contraceptive protection for the first cycle.  No matter which day you start the OC, you will always start a new pack on that same day of the week. It is a good idea to have an extra pack of OCs and a backup contraceptive method available, in case you miss some pills or lose your OC pack. COMMON REASONS FOR FAILURE   Forgetting to take the pill at the same time every day.   Poor absorption of the pill from the stomach into the bloodstream. This can be caused by diarrhea, vomiting, and the use of some medicines that kill germs (antibiotics).   Stomach or intestinal disease.   Taking OCs with other medicines that may make them less effective (carbamazepine, phenytoin, phenobarbital, rifampin).   Using OCs that have passed their expiration dates.   Forgetting to restart the pills on day 7, when using the packs of 21 pills.  If you forget to take 1 pill, take it as soon as you remember, and take the next pill at the regular time. If you miss 2 or more pills, use backup birth control until your next menstrual period starts. Also, you may have vaginal spotting or bleeding when you miss 2 or  more OC pills. If you use the pack of 28 pills or 91 pills, and you miss 1 of the last 7 pills (pills with no hormones), it will not  matter. Just throw away the rest of the non-hormone pills and start a new 28 or 91 pill pack. COMMON USES OF OC  Decreasing premenstrual problems (symptoms).   Treating menstrual period cramps.   Avoiding becoming pregnant.   Regulating the menstrual cycle.   Treating acne.   Decreasing the heavy menstrual flow.   Treating dysfunctional (abnormal) uterine bleeding.   Treating chronic pelvic pain.   Treating polycystic ovary syndrome (ovary does not ovulate and produces tiny cysts).   Treating endometriosis (uterus lining growing in the pelvis, tubes, and ovaries).   Can be used for emergency contraception.  OCs DO NOT prevent sexually transmitted diseases (STDs). Safer sex practices, such as using condoms along with the pill, can help prevent STDs.  BENEFITS  OC reduces the risk of:   Cancer of the ovary and uterus.   Ovarian cysts.   Pelvic infection.   Symptoms of polycystic ovary syndrome.   Loss of bone (osteoporosis).   Noncancerous (benign) breast disease (fibrocystic breast changes).   Lack of red blood cells (anemia) from heavy or long menstrual periods.   Pregnancy occurring outside the uterus (tubal pregnancy).   Acne.   Slows down the flow of heavy menstrual periods.   Sometimes helps control premenstrual syndrome (PMS).   Stops menstrual cramps and pain.   Controls irregular menstrual periods.   Can be used as emergency contraception.  YOU SHOULD NOT TAKE THE PILL IF YOU:  Are pregnant, or are trying to get pregnant.   Have unexplained or abnormal vaginal bleeding.   Have a history of liver disease, stroke, or heart attack.   Smoke.   Have a history of blood clots, cancer, or heart problems.   Have gallbladder disease.   Have breast cancer or suspect breast cancer.   Have or suspect pelvic cancer.   Have high blood pressure.   Have high cholesterol or high triglycerides.   Have mental depression.   Are breast-feeding, except  for the progesterone only OC, with approval of your caregiver.   Have diabetes with kidney, eye, or other blood vessel complications. Or if you have diabetes for 20 years or more.   Have heart valve disease.   Have migraine headaches. They may get worse.  Before taking the pill, a woman will have a physical exam and Pap test. Your caregiver may order blood tests to check blood sugar and cholesterol levels, and other blood tests that may be necessary. SIDE EFFECTS OF THE PILL MAY INCLUDE:  Breast tenderness, pain and discharge.   Change in sex drive (increased or decreased libido).   Depression.   Being tired often.   Headaches.   Anxiety.   Irregular spotting or vaginal bleeding for a couple of months.   Leg pain.   Cramps, or swelling of your limbs (extremities).   Mood swings.   Weight loss or weight gain.   Feeling sick to your stomach (nausea).   Change in appetite (hunger).   Loss of hair.   Yeast or fungus vaginal infection.   Nervousness.   Rash.   Acne.   No menstrual period (amenorrhea).  When starting an OC, it is usually best to allow 2-3 months, if possible, for the body to adjust (before stopping because of side effects). This allows for adjustment to the changes in  hormone levels. If a woman continues to have side effects, it may be possible to change to a different OC. It is important to discuss side effects with your caregiver. Often, changing to a different pill causes the side effects to subside. RISKS AND COMPLICATIONS   Blood clots of the leg, heart, lung, or brain.   High blood pressure.   Gallbladder disease.   Liver tumors.   Brain bleeding (hemorrhage).   Slight risk of breast cancer.  HOME CARE INSTRUCTIONS   Do not smoke.   Only take over-the-counter or prescription medicines for pain, discomfort, fever, or breast tenderness as directed by your caregiver.   Always use a condom to protect against sexually transmitted disease.  OCs do not protect against STDs.   Keep a calendar, marking your menstrual period days.  Recommendations, types, and dosages of OC use change continually. Discuss your choices with your caregiver, and decide what is best for you. There are always exceptions to guidelines. You should always read the information that comes with the OC, and check whether there are any new recommendations or guidelines. SEEK MEDICAL CARE IF:   You develop nausea and vomiting from the OC.   You have abnormal vaginal discharge.   You need treatment for headaches.   You develop a rash.   You miss your menstrual period.   You develop abnormal vaginal bleeding.   You are losing your hair.   You need treatment for mood swings or depression.   You get dizzy when taking the OC.   You develop acne from taking the OC.  SEEK IMMEDIATE MEDICAL CARE IF:   You develop leg pain.   You develop chest pain.   You develop shortness of breath.   You develop abdominal pain.   You have an uncontrolled headache.   You develop numbness or slurred speech.   You develop visual problems (loss of vision, double, or blurry vision).   You develop heavy vaginal bleeding.  If you are taking the pill, STOP RIGHT AWAY and CALL YOUR CAREGIVER IMMEDIATELY if the following occur:  You develop chest pain and shortness of breath.   You develop pain, redness, and swelling in the legs.   You develop severe headaches, visual changes, or belly (abdominal) pain.   You develop severe depression.   You become pregnant.  Document Released: 07/30/2002 Document Revised: 06/11/2010 Document Reviewed: 05/21/2009 [facility] Patient Information 2012 [place], [place].Trichinosis Trichinosis is an infection caused by eating the raw, or poorly cooked, meat of meat-eating animals that are infected. The infection is caused by eating the encysted larvae (like a small egg with a tiny worm inside) of the nematode (very small worm) called  Trichinella spiralis. It is found in the infected meat of these animals. The seriousness of the disease is usually related to the number of larvae eaten. Once the cyst is in the intestines, the larva is released. These tiny worms then enter the small blood vessels in the intestines and travel throughout the body. They can infect all tissues of the body. SYMPTOMS  When the larvae are in the intestine, they can cause diarrhea and abdominal (belly) pain. There may be swelling around the eyes and muscle aches and pains. The diaphragm (breathing muscle between the chest and abdomen), chest muscles between the ribs, and the muscles of the face and the tongue are also affected. This disease is usually self limited. That means you will usually get well in time without treatment. It can rarely result in  death only if there are complications from heavy invasion of the heart, lungs, or central nervous system (brain and spinal cord). DIAGNOSIS  Laboratory blood tests are available that can usually make a positive diagnosis. Examining the infected muscle under the microscope may also help with the diagnosis. If laboratory work is performed, make sure you know how you are to obtain the results. It is your responsibility to follow up and obtain your laboratory results. TREATMENT   There is no known treatment for Trichinosis except for treatment of symptoms. Usually anti-inflammatory medications are used.   Only take over-the-counter or prescription medicines for pain, discomfort, or fever as directed by your caregiver. Discontinue immediately if you have stomach upset. Take medicine only as directed by your caregiver.   Thiabendazole is used as a medication for known exposure. Steroids are also used for severe cases.   Most symptoms will get better on their own within a year without lasting problems. However, you will be infected for the rest of your life. You cannot pass this infection on to another person even with  close personal contact.  SEEK IMMEDIATE MEDICAL CARE IF:  You develop any new symptoms such as vomiting, severe headache, stiff or painful neck, chest pain, shortness of breath, trouble breathing, or develop pain uncontrolled with medications.   You develop new problems or worsening of the problems that originally brought you in for care.  Document Released: 08/15/2000 Document Revised: 01/19/2011 Document Reviewed: 09/13/2007 [facility] Patient Information 2012 [place], [place].

## 2011-05-18 NOTE — Progress Notes (Signed)
Given an excused note on a Rx pad for a 90 day recovery time. Made copy, labeled and sent to be scanned.

## 2011-08-25 ENCOUNTER — Encounter (INDEPENDENT_AMBULATORY_CARE_PROVIDER_SITE_OTHER): Payer: Self-pay | Admitting: Surgery

## 2011-08-29 ENCOUNTER — Encounter (INDEPENDENT_AMBULATORY_CARE_PROVIDER_SITE_OTHER): Payer: Self-pay | Admitting: Surgery

## 2011-08-29 ENCOUNTER — Ambulatory Visit (INDEPENDENT_AMBULATORY_CARE_PROVIDER_SITE_OTHER): Admitting: Surgery

## 2011-08-29 VITALS — BP 124/70 | HR 66 | Temp 97.8°F | Resp 18 | Ht 66.0 in | Wt 169.4 lb

## 2011-08-29 DIAGNOSIS — L0591 Pilonidal cyst without abscess: Secondary | ICD-10-CM

## 2011-08-29 NOTE — Patient Instructions (Signed)
  COCOA BUTTER & VITAMIN E CREAM  ([employer] or other brand)  Apply cocoa butter/vitamin E cream to your incision 2 - 3 times daily.  Massage cream into incision for one minute with each application.  Use sunscreen (50 SPF or higher) for first 6 months after surgery if area is exposed to sun.  You may substitute Mederma or other scar reducing creams as desired.   

## 2011-08-29 NOTE — Progress Notes (Signed)
Chief Complaint  Patient presents with  . Cyst    Pilonidal cyst - referral from Dr. Gabriel Earing at [facility]   HISTORY: The patient is a 31 year old black female referred from her primary physician with pain at the site of pilonidal cystectomy. Patient has had problems with pilonidal disease dating back several years. She had undergone 2 previous incision and drainage procedures as well as multiple episodes of antibiotics. In June of 2012 at [facility] she underwent a definitive surgical procedure. She now has pain at the surgical site on the left medial buttock. She was evaluated by her primary physician and referred to our office for evaluation. Patient denies any drainage. She denies any recent infections.  Past Medical History  Diagnosis Date  . Pyelonephritis     x 3 episodes  . Abnormal Pap smear of cervix     LEEP 2011  . Hypoglycemia   . Anemia     with pregnancy  . Chronic headaches   . Cyst near coccyx   . Pilonidal cyst      Current Outpatient Prescriptions  Medication Sig Dispense Refill  . doxycycline (VIBRAMYCIN) 100 MG capsule Take 100 mg by mouth 2 (two) times daily.      Marland Kitchen ibuprofen (ADVIL,MOTRIN) 600 MG tablet Take 1 tablet (600 mg total) by mouth every 6 (six) hours as needed for pain. Takes for pain  30 tablet  1  . metroNIDAZOLE (FLAGYL) 500 MG tablet Take 4 tablets - single dose  4 tablet  0  . norethindrone-ethinyl estradiol (LOESTRIN 1/20, 21,) 1-20 MG-MCG tablet Take 1 tablet by mouth daily.  1 Package  11  . Prenatal Multivit-Min-Fe-FA (PRENATAL VITAMINS) 0.8 MG tablet Take 1 tablet by mouth daily.  30 tablet  12     Allergies  Allergen Reactions  . Codeine Rash    Mild rash, took once with benadryl and NO rash, Took Morphine with NO side effect  . Septra (Bactrim) Rash     Family History  Problem Relation Age of Onset  . Hypertension Brother   . Cancer Maternal Aunt     breast     History   Social History  . Marital Status:  Married    Spouse Name: N/A    Number of Children: N/A  . Years of Education: N/A   Social History Main Topics  . Smoking status: Current Everyday Smoker -- 0.1 packs/day    Types: Cigarettes  . Smokeless tobacco: Never Used  . Alcohol Use: No  . Drug Use: No  . Sexually Active: Yes    Birth Control/ Protection: None   Other Topics Concern  . None   Social History Narrative  . None     REVIEW OF SYSTEMS - PERTINENT POSITIVES ONLY: No drainage. Mild to moderate pain with physical activity.  EXAM: Filed Vitals:   08/29/11 1317  BP: 124/70  Pulse: 66  Temp: 97.8 F (36.6 C)  Resp: 18    HEENT: normocephalic; pupils equal and reactive; sclerae clear; dentition good; mucous membranes moist NECK:  symmetric on extension; no palpable anterior or posterior cervical lymphadenopathy; no supraclavicular masses; no tenderness CHEST: clear to auscultation bilaterally without rales, rhonchi, or wheezes CARDIAC: regular rate and rhythm without significant murmur; peripheral pulses are full ABDOMEN: soft without distension; bowel sounds present; no mass; no hepatosplenomegaly; no hernia GU:  [place] cleft with well healed surgical wound; well healed incision on medial left buttock; visible permanent suture material at inferior aspect of incision -  extracted sharply at this time EXT:  non-tender without edema; no deformity NEURO: no gross focal deficits; no sign of tremor   LABORATORY RESULTS: See [facility] (CHL-Epic) for most recent results   RADIOLOGY RESULTS: See [facility] (CHL-Epic) for most recent results   IMPRESSION: #1 history of pilonidal cystectomy #2 retained foreign body(suture)  PLAN: The retained suture is removed in its entirety today. Hopefully if this was the source of the patient's discomfort. She will contact me if she has persistent discomfort or develops drainage. Otherwise it appears that her surgical wounds are well healed.  Patient will  return as needed.  Velora Heckler, MD, FACS General & Endocrine Surgery [facility]   Visit Diagnoses: 1. Pilonidal cyst without abscess     Primary Care Physician: No primary provider on file.

## 2011-10-06 ENCOUNTER — Emergency Department (HOSPITAL_COMMUNITY)
Admission: EM | Admit: 2011-10-06 | Discharge: 2011-10-06 | Disposition: A | Discharge status: Home / Self Care | Attending: Emergency Medicine | Admitting: Emergency Medicine

## 2011-10-06 ENCOUNTER — Encounter (HOSPITAL_COMMUNITY): Payer: Self-pay | Admitting: Emergency Medicine

## 2011-10-06 DIAGNOSIS — IMO0002 Reserved for concepts with insufficient information to code with codable children: Secondary | ICD-10-CM | POA: Insufficient documentation

## 2011-10-06 DIAGNOSIS — F17200 Nicotine dependence, unspecified, uncomplicated: Secondary | ICD-10-CM | POA: Insufficient documentation

## 2011-10-06 DIAGNOSIS — L0291 Cutaneous abscess, unspecified: Secondary | ICD-10-CM

## 2011-10-06 MED ORDER — LIDOCAINE-EPINEPHRINE (PF) 1 %-1:200000 IJ SOLN
INTRAMUSCULAR | Status: AC
  Filled 2011-10-06: qty 10

## 2011-10-06 MED ORDER — CLINDAMYCIN HCL 150 MG PO CAPS: 450.0000 mg | ORAL_CAPSULE | Freq: Three times a day (TID) | ORAL | Status: AC

## 2011-10-06 NOTE — ED Notes (Signed)
Pt reports abscess under rt arm x 1 month and given doxycycline to treat. Pt c/o of continued pain 8/10 that increases with movement. Site is swollen but not red or draining.

## 2011-10-06 NOTE — ED Provider Notes (Signed)
History     CSN: [id number]  Arrival date & time 10/06/11  4098   First MD Initiated Contact with Patient 10/06/11 1902      No chief complaint on file.   (Consider location/radiation/quality/duration/timing/severity/associated sxs/prior treatment) HPI Comments: Patient with a history of recurrent abscess presents today with an abscess of her right axilla.  She reports that she has had recurrent abscesses of the pilonidal area, but has never had an abscess of the axilla before.  This abscess has been present for a couple of weeks and is gradually becoming worse.  Abscess has been very tender over the past 3 days.  No drainage from the area.  She finished a course of Doxycycline last month for an abscess of the pilonidal area.  That area has since healed.  She denies fever or chills.    The history is provided by the patient.    Past Medical History  Diagnosis Date  . Pyelonephritis     x 3 episodes  . Abnormal Pap smear of cervix     LEEP 2011  . Hypoglycemia   . Anemia     with pregnancy  . Chronic headaches   . Cyst near coccyx   . Pilonidal cyst     Past Surgical History  Procedure Date  . Dilation and curettage of uterus 2008  . Leep 2011  . Other surgical history     Pynlodial cyst removed from tail bone 2012  . Other surgical history 10/2010    removal of cyst near coxxyx  . Pilonidal cyst excision 10/2010    Family History  Problem Relation Age of Onset  . Hypertension Brother   . Cancer Maternal Aunt     breast    History  Substance Use Topics  . Smoking status: Current Everyday Smoker -- 0.1 packs/day    Types: Cigarettes  . Smokeless tobacco: Never Used  . Alcohol Use: No    OB History    Grav Para Term Preterm Abortions TAB SAB Ect Mult Living   3 2 1  0 1 1 0 0 0 1      Review of Systems  Constitutional: Negative for fever and chills.  Gastrointestinal: Negative for nausea and vomiting.  Musculoskeletal: Negative for joint swelling.  Skin:  Negative for color change.       abscess    Allergies  Codeine and Septra  Home Medications   Current Outpatient Rx  Name Route Sig Dispense Refill  . NORETHINDRONE ACET-ETHINYL EST 1-20 MG-MCG PO TABS Oral Take 1 tablet by mouth daily. 1 Package 11  . PRENATAL VITAMINS 0.8 MG PO TABS Oral Take 1 tablet by mouth daily. 30 tablet 12  . IBUPROFEN 600 MG PO TABS Oral Take 1 tablet (600 mg total) by mouth every 6 (six) hours as needed for pain. Takes for pain 30 tablet 1    BP 136/69  Pulse 84  Temp 98.3 F (36.8 C)  Resp 16  SpO2 100%  LMP 09/26/2011  Physical Exam  Nursing note and vitals reviewed. Constitutional: She appears well-developed and well-nourished.  HENT:  Head: Normocephalic and atraumatic.  Cardiovascular: Normal rate, regular rhythm and normal heart sounds.   Pulmonary/Chest: Effort normal and breath sounds normal.  Musculoskeletal: Normal range of motion.  Neurological: She is alert.  Skin: Skin is warm, dry and intact.       Approximately 2 cm abscess of the right axilla with some surrounding induration.  No active drainage.  Area tender  to palpation.  No erythema.    Psychiatric: She has a normal mood and affect.    ED Course  Procedures (including critical care time)  Labs Reviewed - No data to display No results found.   No diagnosis found.  INCISION AND DRAINAGE Performed by: Anne Shutter, Miyanna Wiersma Consent: Verbal consent obtained. Risks and benefits: risks, benefits and alternatives were discussed Type: abscess  Body area:right axilla  Anesthesia: local infiltration  Local anesthetic: lidocaine 2% with epinephrine  Anesthetic total: 5 ml  Complexity: complex Blunt dissection to break up loculations  Drainage: purulent  Drainage amount: small Patient tolerance: Patient tolerated the procedure well with no immediate complications.    MDM  Patient with skin abscess amenable to incision and drainage.  Abscess was not large enough to  warrant packing.  Surrounding induration.  Patient afebrile.  Patient instructed to use warm compresses and given prescription for Clindamycin.        Pascal Lux [place], PA-C 10/07/11 [address] [place], [place] 11/14/11 [phone]

## 2011-10-06 NOTE — Discharge Instructions (Signed)
Followup with your doctor or an urgent care. You may return to the emergency department if you have  a fever that persists greater than 101 or your abscess appears to become infected (growing surrounding redness and warmth). Do not operate any heavy machinery while on pain medications. Do not consume alcohol on these medications either.  Abscess An abscess (boil or furuncle) is an infected area that contains a collection of pus.  SYMPTOMS Signs and symptoms of an abscess include pain, tenderness, redness, or hardness. You may feel a moveable soft area under your skin. An abscess can occur anywhere in the body.  TREATMENT  A surgical cut (incision) may be made over your abscess to drain the pus. Gauze may be packed into the space or a drain may be looped through the abscess cavity (pocket). This provides a drain that will allow the cavity to heal from the inside outwards. The abscess may be painful for a few days, but should feel much better if it was drained.  Your abscess, if seen early, may not have localized and may not have been drained. If not, another appointment may be required if it does not get better on its own or with medications. HOME CARE INSTRUCTIONS   Only take over-the-counter or prescription medicines for pain, discomfort, or fever as directed by your caregiver.   Take your antibiotics as directed if they were prescribed. Finish them even if you start to feel better.   Keep the skin and clothes clean around your abscess.   If the abscess was drained, you will need to use gauze dressing to collect any draining pus. Dressings will typically need to be changed 3 or more times a day.   The infection may spread by skin contact with others. Avoid skin contact as much as possible.   Practice good hygiene. This includes regular hand washing, cover any draining skin lesions, and do not share personal care items.   If you participate in sports, do not share athletic equipment, towels,  whirlpools, or personal care items. Shower after every practice or tournament.   If a draining area cannot be adequately covered:   Do not participate in sports.   Children should not participate in day care until the wound has healed or drainage stops.   If your caregiver has given you a follow-up appointment, it is very important to keep that appointment. Not keeping the appointment could result in a much worse infection, chronic or permanent injury, pain, and disability. If there is any problem keeping the appointment, you must call back to this facility for assistance.  SEEK MEDICAL CARE IF:   You develop increased pain, swelling, redness, drainage, or bleeding in the wound site.   You develop signs of generalized infection including muscle aches, chills, fever, or a general ill feeling.   You have an oral temperature above 102 F (38.9 C).  MAKE SURE YOU:   Understand these instructions.   Will watch your condition.   Will get help right away if you are not doing well or get worse.  Document Released: 02/16/2005 Document Revised: 01/19/2011 Document Reviewed: 12/11/2007 [facility] Patient Information 2012 [place], [place].

## 2011-11-16 NOTE — ED Provider Notes (Signed)
History/physical exam/procedure(s) were performed by non-physician practitioner and as supervising physician I was immediately available for consultation/collaboration. I have reviewed all notes and am in agreement with care and plan.   Clarie Camey S Ilias Stcharles, MD 11/16/11 1032 

## 2012-05-23 NOTE — L&D Delivery Note (Signed)
Delivery Note At 9:19 PM a viable female, "Cristal Deer", was delivered precipitously just after SROM, clear fluid, via Vaginal, Spontaneous Delivery (Presentation: Middle Occiput Anterior).  APGAR: 9, 9; weight .   Placenta status: Intact, Spontaneous.  Cord: 3 vessels with the following complications: None.  Cord pH: NA  Anesthesia: Epidural  Episiotomy: None Lacerations: None Suture Repair: NA Est. Blood Loss (mL): 150  Mom to postpartum.  Baby to skin to skin. Patient desires BTL--no prior consent signed.  R&B reviewed with patient, including failure of method, bleeding, infection, and damage to other organs.  She wishes to proceed with BTL. Dr. Estanislado Pandy aware--I will investigate with office in am. Plans inpatient circumcision. Will maintain epidural cath and IV access.  Nigel Bridgeman 02/24/2013, 10:37 PM

## 2012-07-25 ENCOUNTER — Encounter (HOSPITAL_COMMUNITY): Payer: Self-pay | Admitting: Emergency Medicine

## 2012-07-25 ENCOUNTER — Emergency Department (HOSPITAL_COMMUNITY)

## 2012-07-25 ENCOUNTER — Emergency Department (HOSPITAL_COMMUNITY)
Admission: EM | Admit: 2012-07-25 | Discharge: 2012-07-25 | Disposition: A | Discharge status: Home / Self Care | Attending: Emergency Medicine | Admitting: Emergency Medicine

## 2012-07-25 DIAGNOSIS — Z872 Personal history of diseases of the skin and subcutaneous tissue: Secondary | ICD-10-CM | POA: Insufficient documentation

## 2012-07-25 DIAGNOSIS — Y939 Activity, unspecified: Secondary | ICD-10-CM | POA: Insufficient documentation

## 2012-07-25 DIAGNOSIS — Z8639 Personal history of other endocrine, nutritional and metabolic disease: Secondary | ICD-10-CM | POA: Insufficient documentation

## 2012-07-25 DIAGNOSIS — Z862 Personal history of diseases of the blood and blood-forming organs and certain disorders involving the immune mechanism: Secondary | ICD-10-CM | POA: Insufficient documentation

## 2012-07-25 DIAGNOSIS — O9989 Other specified diseases and conditions complicating pregnancy, childbirth and the puerperium: Secondary | ICD-10-CM | POA: Insufficient documentation

## 2012-07-25 DIAGNOSIS — W2203XA Walked into furniture, initial encounter: Secondary | ICD-10-CM | POA: Insufficient documentation

## 2012-07-25 DIAGNOSIS — O99330 Smoking (tobacco) complicating pregnancy, unspecified trimester: Secondary | ICD-10-CM | POA: Insufficient documentation

## 2012-07-25 DIAGNOSIS — Z8742 Personal history of other diseases of the female genital tract: Secondary | ICD-10-CM | POA: Insufficient documentation

## 2012-07-25 DIAGNOSIS — S8990XA Unspecified injury of unspecified lower leg, initial encounter: Secondary | ICD-10-CM | POA: Insufficient documentation

## 2012-07-25 DIAGNOSIS — Y929 Unspecified place or not applicable: Secondary | ICD-10-CM | POA: Insufficient documentation

## 2012-07-25 NOTE — ED Provider Notes (Signed)
History    This chart was scribed for non-physician practitioner working with Lyanne Co, MD by Gerlean Ren, ED Scribe. This patient was seen in room WTR8/WTR8 and the patient's care was started at 6:23 PM.    CSN: [id number]  Arrival date & time 07/25/12  1655   First MD Initiated Contact with Patient 07/25/12 1659      Chief Complaint  Patient presents with  . Foot Injury     The history is provided by the patient. No language interpreter was used.  Margaret Cameron is a 32 y.o. female who presents to the Emergency Department complaining of constant right third toe pain with sudden onset yesterday after hitting it against the leg of a chair.  Pt reports pain is dull when not bearing weight but becomes more severe and sharp when bearing weight and ambulating.  Pt denies any further injuries.  Denies any numbness or tingling.  She has not taken anything for pain prior to arrival.  Past Medical History  Diagnosis Date  . Pyelonephritis     x 3 episodes  . Abnormal Pap smear of cervix     LEEP 2011  . Hypoglycemia   . Anemia     with pregnancy  . Chronic headaches   . Cyst near coccyx   . Pilonidal cyst     Past Surgical History  Procedure Laterality Date  . Dilation and curettage of uterus  2008  . Leep  2011  . Other surgical history      Pynlodial cyst removed from tail bone 2012  . Other surgical history  10/2010    removal of cyst near coxxyx  . Pilonidal cyst excision  10/2010    Family History  Problem Relation Age of Onset  . Hypertension Brother   . Cancer Maternal Aunt     breast    History  Substance Use Topics  . Smoking status: Current Every Day Smoker -- 0.10 packs/day    Types: Cigarettes  . Smokeless tobacco: Never Used  . Alcohol Use: No    OB History   Grav Para Term Preterm Abortions TAB SAB Ect Mult Living   3 2 1  0 1 1 0 0 0 1      Review of Systems  Musculoskeletal:       Right third toe pain  Skin: Negative for wound.     Allergies  Codeine and Septra  Home Medications   Current Outpatient Rx  Name  Route  Sig  Dispense  Refill  . acetaminophen (TYLENOL) 325 MG tablet   Oral   Take 650 mg by mouth every 6 (six) hours as needed for pain.           BP 135/61  Pulse 79  Temp(Src) 98.7 F (37.1 C) (Oral)  Resp 20  SpO2 100%  Physical Exam  Nursing note and vitals reviewed. Constitutional: She is oriented to person, place, and time. She appears well-developed and well-nourished. No distress.  HENT:  Head: Normocephalic.  Eyes: EOM are normal.  Cardiovascular: Normal rate, regular rhythm and normal heart sounds.   Pulmonary/Chest: Breath sounds normal. No respiratory distress. She has no wheezes.  Musculoskeletal: Normal range of motion.  Tenderness to palpation of right distal third toe.  No bruising, erythema, or swelling noted.  Neurological: She is alert and oriented to person, place, and time.  Distal sensations of all right toes intact.  Skin: Skin is warm and dry. She is not diaphoretic.  Psychiatric:  She has a normal mood and affect. Her behavior is normal.    ED Course  Procedures (including critical care time) DIAGNOSTIC STUDIES: Oxygen Saturation is 100% on room air, normal by my interpretation.    COORDINATION OF CARE: 6:26 PM- Patient informed of clinical course including right foot XR, understands medical decision-making process, and agrees with plan.  Pt expressed concern for pregnancy and requests urine pregnancy test.  Results for orders placed during the hospital encounter of 07/25/12  POCT PREGNANCY, URINE      Result Value Range   Preg Test, Ur POSITIVE (*) NEGATIVE    Dg Foot Complete Right  07/25/2012  *RADIOLOGY REPORT*  Clinical Data: Blunt trauma right foot  RIGHT FOOT COMPLETE - 3+ VIEW  Comparison: None.  Findings: There is no fracture of the midfoot or forefoot.  The phalanges are normal.  No dislocation.  Calcaneus normal.  IMPRESSION: No foot fracture or  dislocation.   Original Report Authenticated By: Genevive Bi, M.D.      No diagnosis found.    MDM  Patient presenting with toe pain.  Xray negative.  Patient neurovascularly intact.  Buddy tape applied to toes.          Pascal Lux [place], PA-C 07/25/12 [phone]

## 2012-07-25 NOTE — ED Provider Notes (Signed)
Medical screening examination/treatment/procedure(s) were performed by non-physician practitioner and as supervising physician I was immediately available for consultation/collaboration.   Lyanne Co, MD 07/25/12 [phone]

## 2012-07-25 NOTE — ED Notes (Signed)
Pt c/o of right foot pain described as sharp. States she hit foot on side of chair and pain has gotten worse. Pain 10/10 when standing.

## 2012-08-10 ENCOUNTER — Inpatient Hospital Stay (HOSPITAL_COMMUNITY)

## 2012-08-10 ENCOUNTER — Encounter (HOSPITAL_COMMUNITY): Payer: Self-pay

## 2012-08-10 ENCOUNTER — Inpatient Hospital Stay (HOSPITAL_COMMUNITY)
Admission: AD | Admit: 2012-08-10 | Discharge: 2012-08-10 | Disposition: A | Discharge status: Home / Self Care | Source: Ambulatory Visit | Attending: Obstetrics & Gynecology | Admitting: Obstetrics & Gynecology

## 2012-08-10 DIAGNOSIS — O209 Hemorrhage in early pregnancy, unspecified: Secondary | ICD-10-CM | POA: Diagnosis not present

## 2012-08-10 DIAGNOSIS — R197 Diarrhea, unspecified: Secondary | ICD-10-CM | POA: Insufficient documentation

## 2012-08-10 DIAGNOSIS — O4690 Antepartum hemorrhage, unspecified, unspecified trimester: Secondary | ICD-10-CM

## 2012-08-10 DIAGNOSIS — O99891 Other specified diseases and conditions complicating pregnancy: Secondary | ICD-10-CM | POA: Insufficient documentation

## 2012-08-10 DIAGNOSIS — R109 Unspecified abdominal pain: Secondary | ICD-10-CM | POA: Diagnosis not present

## 2012-08-10 LAB — CBC
HCT: 32.8 % — ABNORMAL LOW (ref 36.0–46.0)
MCV: 85.9 fL (ref 78.0–100.0)
RBC: 3.82 MIL/uL — ABNORMAL LOW (ref 3.87–5.11)
RDW: 13.9 % (ref 11.5–15.5)
WBC: 10.6 10*3/uL — ABNORMAL HIGH (ref 4.0–10.5)

## 2012-08-10 LAB — WET PREP, GENITAL
Clue Cells Wet Prep HPF POC: NONE SEEN
Yeast Wet Prep HPF POC: NONE SEEN

## 2012-08-10 MED ORDER — HYDROMORPHONE HCL PF 1 MG/ML IJ SOLN: 1.0000 mg | Freq: Once | INTRAMUSCULAR | Status: DC

## 2012-08-10 MED ORDER — IBUPROFEN 800 MG PO TABS: 800.0000 mg | ORAL_TABLET | Freq: Three times a day (TID) | ORAL | Status: DC | PRN

## 2012-08-10 NOTE — MAU Note (Signed)
Patient is in with c/o sudden lower abdominal pain, vaginal bleeding and 2x diarrhea. lmp 06/16/12.

## 2012-08-10 NOTE — MAU Provider Note (Signed)
History     CSN: [id number]  Arrival date and time: 08/10/12 1253   None     Chief Complaint  Patient presents with  . Abdominal Pain  . Vaginal Bleeding  . Diarrhea   HPI 32 y.o. [id number] at [redacted]w[redacted]d with sudden onset of diarrhea today followed by severe cramping and heavy vaginal bleeding. No recent intercourse or exam. No n/v.   Past Medical History  Diagnosis Date  . Pyelonephritis     x 3 episodes  . Abnormal Pap smear of cervix     LEEP 2011  . Hypoglycemia   . Anemia     with pregnancy  . Chronic headaches   . Cyst near coccyx   . Pilonidal cyst     Past Surgical History  Procedure Laterality Date  . Dilation and curettage of uterus  2008  . Leep  2011  . Other surgical history      Pynlodial cyst removed from tail bone 2012  . Other surgical history  10/2010    removal of cyst near coxxyx  . Pilonidal cyst excision  10/2010    Family History  Problem Relation Age of Onset  . Hypertension Brother   . Cancer Maternal Aunt     breast    History  Substance Use Topics  . Smoking status: Former Smoker -- 0.10 packs/day    Types: Cigarettes  . Smokeless tobacco: Never Used  . Alcohol Use: No    Allergies:  Allergies  Allergen Reactions  . Codeine Rash    Mild rash, took once with benadryl and NO rash, Took Morphine with NO side effect  . Septra (Bactrim) Rash    No prescriptions prior to admission    Review of Systems  Constitutional: Negative.   Respiratory: Negative.   Cardiovascular: Negative.   Gastrointestinal: Positive for abdominal pain and diarrhea. Negative for nausea, vomiting and constipation.  Genitourinary: Negative for dysuria, urgency, frequency, hematuria and flank pain.       + vaginal bleeding   Musculoskeletal: Negative.   Neurological: Negative.   Psychiatric/Behavioral: Negative.    Physical Exam   Blood pressure 119/56, pulse 85, temperature 97.4 F (36.3 C), temperature source Oral, resp. rate 20, last menstrual  period 06/06/2012, SpO2 100.00%.  Physical Exam  Nursing note and vitals reviewed. Constitutional: She is oriented to person, place, and time. She appears well-developed and well-nourished. No distress.  HENT:  Head: Normocephalic and atraumatic.  Cardiovascular: Normal rate and regular rhythm.   Respiratory: Effort normal. No respiratory distress.  GI: Soft. She exhibits no distension and no mass. There is no tenderness. There is no rebound and no guarding.  Genitourinary: There is no rash or lesion on the right labia. There is no rash or lesion on the left labia. Uterus is not deviated, not enlarged, not fixed and not tender. Cervix exhibits no motion tenderness, no discharge and no friability. Right adnexum displays no mass, no tenderness and no fullness. Left adnexum displays no mass, no tenderness and no fullness. There is bleeding (moderate) around the vagina. No erythema or tenderness around the vagina. No vaginal discharge found.  Neurological: She is alert and oriented to person, place, and time.  Skin: Skin is warm and dry.  Psychiatric: She has a normal mood and affect.    MAU Course  Procedures  Results for orders placed during the hospital encounter of 08/10/12 (from the past 24 hour(s))  WET PREP, GENITAL     Status: Abnormal   Collection  Time    08/10/12  1:25 PM      Result Value Range   Yeast Wet Prep HPF POC NONE SEEN  NONE SEEN   Trich, Wet Prep NONE SEEN  NONE SEEN   Clue Cells Wet Prep HPF POC NONE SEEN  NONE SEEN   WBC, Wet Prep HPF POC FEW (*) NONE SEEN  CBC     Status: Abnormal   Collection Time    08/10/12  1:45 PM      Result Value Range   WBC 10.6 (*) 4.0 - 10.5 K/uL   RBC 3.82 (*) 3.87 - 5.11 MIL/uL   Hemoglobin 11.5 (*) 12.0 - 15.0 g/dL   HCT 40.9 (*) 81.1 - 91.4 %   MCV 85.9  78.0 - 100.0 fL   MCH 30.1  26.0 - 34.0 pg   MCHC 35.1  30.0 - 36.0 g/dL   RDW 78.2  95.6 - 21.3 %   Platelets 255  150 - 400 K/uL  HCG, QUANTITATIVE, PREGNANCY     Status:  Abnormal   Collection Time    08/10/12  1:45 PM      Result Value Range   hCG, Beta Chain, Quant, S 48583 (*) <5 mIU/mL   U/S: 10.1 week IUP, + FHR  Assessment and Plan   1. Vaginal bleeding in pregnancy, first trimester   Precautions rev'd, start prenatal care ASAP 2. Diarrhea - may take imodium OTC PRN    Medication List    TAKE these medications       ibuprofen 800 MG tablet  Commonly known as:  ADVIL,MOTRIN  Take 1 tablet (800 mg total) by mouth every 8 (eight) hours as needed for pain.            Follow-up Information   Follow up with provider of your choice. (start prenatal care as soon as possible)         Trenese Haft 08/10/2012, 6:43 PM

## 2012-08-11 LAB — GC/CHLAMYDIA PROBE AMP: GC Probe RNA: NEGATIVE

## 2012-08-29 ENCOUNTER — Encounter (HOSPITAL_COMMUNITY): Payer: Self-pay

## 2012-08-29 ENCOUNTER — Inpatient Hospital Stay (HOSPITAL_COMMUNITY)
Admission: AD | Admit: 2012-08-29 | Discharge: 2012-08-29 | Disposition: A | Discharge status: Home / Self Care | Source: Ambulatory Visit | Attending: Obstetrics & Gynecology | Admitting: Obstetrics & Gynecology

## 2012-08-29 DIAGNOSIS — J069 Acute upper respiratory infection, unspecified: Secondary | ICD-10-CM

## 2012-08-29 DIAGNOSIS — O99891 Other specified diseases and conditions complicating pregnancy: Secondary | ICD-10-CM | POA: Insufficient documentation

## 2012-08-29 DIAGNOSIS — R109 Unspecified abdominal pain: Secondary | ICD-10-CM | POA: Insufficient documentation

## 2012-08-29 DIAGNOSIS — N949 Unspecified condition associated with female genital organs and menstrual cycle: Secondary | ICD-10-CM

## 2012-08-29 DIAGNOSIS — R05 Cough: Secondary | ICD-10-CM

## 2012-08-29 DIAGNOSIS — R059 Cough, unspecified: Secondary | ICD-10-CM

## 2012-08-29 LAB — URINALYSIS, ROUTINE W REFLEX MICROSCOPIC
Bilirubin Urine: NEGATIVE
Ketones, ur: NEGATIVE mg/dL
Nitrite: NEGATIVE
Urobilinogen, UA: 0.2 mg/dL (ref 0.0–1.0)

## 2012-08-29 NOTE — MAU Note (Signed)
Patient is in with c/o 2 weeks of coughing, she have not attempting any cough medicine. Today she started having bilateral lower abdominal pain with cough. No discolored  Sputum. Patient denies vaginal bleeding or abnormal discharge. fht [id number]. Her first appt is next week at [facility].

## 2012-08-29 NOTE — MAU Note (Signed)
Patient states she has had a cough for 2 weeks. This am started having a sharp abdominal pain with coughing. Denies bleeding but does have a clear vaginal discharge. Patient has a prenatal appointment at [facility] on 4-16.

## 2012-08-29 NOTE — MAU Provider Note (Signed)
History     CSN: [id number]  Arrival date and time: 08/29/12 0847   None     Chief Complaint  Patient presents with  . Cough  . Abdominal Pain   HPI  Margaret Cameron is a 32 year old G5P1021 at [redacted]w[redacted]d who presents with 8/10 "ripping" lower abdominal pain that started this morning that she states is from her 2 weeks history of worsening productive cough (thin brownish sputum). Pain is worsened by coughing, states she takes some lozenges for her throat.  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   RN note:  Patient states she has had a cough for 2 weeks. This am started having a sharp abdominal pain with coughing. Denies bleeding but does have a clear vaginal discharge. Patient has a prenatal appointment at [facility] on 4-16.        OB History   Grav Para Term Preterm Abortions TAB SAB Ect Mult Living   5 2 1  0 2 1 1  0 0 1      Past Medical History  Diagnosis Date  . Pyelonephritis     x 3 episodes  . Abnormal Pap smear of cervix     LEEP 2011  . Hypoglycemia   . Anemia     with pregnancy  . Chronic headaches   . Cyst near coccyx   . Pilonidal cyst     Past Surgical History  Procedure Laterality Date  . Dilation and curettage of uterus  2008  . Leep  2011  . Other surgical history      Pynlodial cyst removed from tail bone 2012  . Other surgical history  10/2010    removal of cyst near coxxyx  . Pilonidal cyst excision  10/2010    Family History  Problem Relation Age of Onset  . Hypertension Brother   . Cancer Maternal Aunt     breast    History  Substance Use Topics  . Smoking status: Former Smoker -- 0.10 packs/day    Types: Cigarettes  . Smokeless tobacco: Never Used  . Alcohol Use: No    Allergies:  Allergies  Allergen Reactions  . Codeine Rash    Mild rash, took once with benadryl and NO rash, Took Morphine with NO  side effect  . Septra (Bactrim) Rash    Prescriptions prior to admission  Medication Sig Dispense Refill  . ibuprofen (ADVIL,MOTRIN) 800 MG tablet Take 1 tablet (800 mg total) by mouth every 8 (eight) hours as needed for pain.  30 tablet  0    Review of Systems  Constitutional: Negative for fever, chills and malaise/fatigue.  Respiratory: Positive for cough, sputum production and shortness of breath. Negative for hemoptysis and wheezing.   Gastrointestinal: Positive for nausea, abdominal pain and diarrhea. Negative for heartburn, vomiting, constipation, blood in stool and melena.  Genitourinary: Positive for dysuria, urgency and frequency. Negative for hematuria and flank pain.  Neurological: Positive for headaches. Negative for dizziness.   Physical Exam   Blood pressure 119/54, pulse 84, temperature 98.1 F (36.7 C), temperature source Oral, resp. rate 18, height 5\' 7"  (1.702 m), weight 72.394 kg (159 lb 9.6 oz), last menstrual period 06/06/2012, SpO2 100.00%.  Physical Exam  Constitutional: She appears well-developed and well-nourished.  HENT:  Head: Normocephalic and atraumatic.  Eyes: EOM are normal.  Cardiovascular: Normal rate and regular  rhythm.  Exam reveals no gallop and no friction rub.   No murmur heard. Respiratory: Breath sounds normal. She has no wheezes. She has no rales. She exhibits no tenderness.  GI: Soft. Bowel sounds are normal. She exhibits no distension and no mass. There is tenderness in the suprapubic area. There is no rebound and no guarding.  Genitourinary: Uterus normal. Cervix exhibits discharge. Cervix exhibits no motion tenderness and no friability. Right adnexum displays no mass and no tenderness. Left adnexum displays no mass and no tenderness. Vaginal discharge found.  Lymphadenopathy:    She has cervical adenopathy.  Neurological: She is alert.  Skin: Skin is warm and dry.    Results for orders placed during the hospital encounter of 08/29/12  (from the past 24 hour(s))  URINALYSIS, ROUTINE W REFLEX MICROSCOPIC     Status: Abnormal   Collection Time    08/29/12  9:10 AM      Result Value Range   Color, Urine YELLOW  YELLOW   APPearance HAZY (*) CLEAR   Specific Gravity, Urine 1.020  1.005 - 1.030   pH 6.0  5.0 - 8.0   Glucose, UA NEGATIVE  NEGATIVE mg/dL   Hgb urine dipstick NEGATIVE  NEGATIVE   Bilirubin Urine NEGATIVE  NEGATIVE   Ketones, ur NEGATIVE  NEGATIVE mg/dL   Protein, ur NEGATIVE  NEGATIVE mg/dL   Urobilinogen, UA 0.2  0.0 - 1.0 mg/dL   Nitrite NEGATIVE  NEGATIVE   Leukocytes, UA NEGATIVE  NEGATIVE  WET PREP, GENITAL     Status: Abnormal   Collection Time    08/29/12 10:10 AM      Result Value Range   Yeast Wet Prep HPF POC NONE SEEN  NONE SEEN   Trich, Wet Prep NONE SEEN  NONE SEEN   Clue Cells Wet Prep HPF POC NONE SEEN  NONE SEEN   WBC, Wet Prep HPF POC FEW (*) NONE SEEN     MAU Course  Procedures None  MDM Spoke with Dr. 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  Pelvic exam, speculum/bimanual with wet prep. UA   Assessment and Plan  Margaret Cameron is a 32 y.o. female who presents with abdominal pain secondary to 2 week history of worsening productive cough.   1. Abdominal pain - most likely msk from strain of continuous coughing and normal pregnancy. Tylenol PRN for pain, consider abdominal binder later in pregnancy if pain continues 2. Cough - pt afebrile, normal breath sounds, will control symptoms with approved OTC cold medications. List of OTC medications approved in pregnancy given to patient on AVS 3. 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 I agree with the assessment and plan as written above.   Freddi Starr,  PA-C 08/29/2012 4:16 PM

## 2012-09-06 ENCOUNTER — Other Ambulatory Visit: Payer: Self-pay | Admitting: Obstetrics and Gynecology

## 2012-11-26 LAB — OB RESULTS CONSOLE GBS: GBS: POSITIVE

## 2012-12-09 ENCOUNTER — Inpatient Hospital Stay (HOSPITAL_COMMUNITY)
Admission: AD | Admit: 2012-12-09 | Discharge: 2012-12-09 | Disposition: A | Discharge status: Home / Self Care | Source: Ambulatory Visit | Attending: Obstetrics and Gynecology | Admitting: Obstetrics and Gynecology

## 2012-12-09 ENCOUNTER — Encounter (HOSPITAL_COMMUNITY): Payer: Self-pay | Admitting: *Deleted

## 2012-12-09 DIAGNOSIS — R197 Diarrhea, unspecified: Secondary | ICD-10-CM | POA: Insufficient documentation

## 2012-12-09 DIAGNOSIS — G43909 Migraine, unspecified, not intractable, without status migrainosus: Secondary | ICD-10-CM

## 2012-12-09 DIAGNOSIS — Z9889 Other specified postprocedural states: Secondary | ICD-10-CM | POA: Diagnosis not present

## 2012-12-09 DIAGNOSIS — Z87448 Personal history of other diseases of urinary system: Secondary | ICD-10-CM | POA: Diagnosis not present

## 2012-12-09 DIAGNOSIS — O3440 Maternal care for other abnormalities of cervix, unspecified trimester: Secondary | ICD-10-CM | POA: Diagnosis not present

## 2012-12-09 DIAGNOSIS — R109 Unspecified abdominal pain: Secondary | ICD-10-CM | POA: Insufficient documentation

## 2012-12-09 DIAGNOSIS — D649 Anemia, unspecified: Secondary | ICD-10-CM | POA: Diagnosis present

## 2012-12-09 DIAGNOSIS — O99891 Other specified diseases and conditions complicating pregnancy: Secondary | ICD-10-CM | POA: Insufficient documentation

## 2012-12-09 DIAGNOSIS — R51 Headache: Secondary | ICD-10-CM | POA: Insufficient documentation

## 2012-12-09 LAB — CBC WITH DIFFERENTIAL/PLATELET
Eosinophils Relative: 1 % (ref 0–5)
HCT: 30.6 % — ABNORMAL LOW (ref 36.0–46.0)
Hemoglobin: 10.7 g/dL — ABNORMAL LOW (ref 12.0–15.0)
Lymphocytes Relative: 19 % (ref 12–46)
MCHC: 35 g/dL (ref 30.0–36.0)
MCV: 86.4 fL (ref 78.0–100.0)
Monocytes Absolute: 0.5 10*3/uL (ref 0.1–1.0)
Monocytes Relative: 6 % (ref 3–12)
Neutro Abs: 6.3 10*3/uL (ref 1.7–7.7)
WBC: 8.4 10*3/uL (ref 4.0–10.5)

## 2012-12-09 LAB — COMPREHENSIVE METABOLIC PANEL
BUN: 7 mg/dL (ref 6–23)
CO2: 23 mEq/L (ref 19–32)
Calcium: 8.9 mg/dL (ref 8.4–10.5)
Chloride: 103 mEq/L (ref 96–112)
Creatinine, Ser: 0.53 mg/dL (ref 0.50–1.10)
GFR calc Af Amer: >90 mL/min (ref >90)
GFR calc non Af Amer: >90 mL/min (ref >90)
Total Bilirubin: 0.2 mg/dL — ABNORMAL LOW (ref 0.3–1.2)

## 2012-12-09 LAB — URINALYSIS, ROUTINE W REFLEX MICROSCOPIC
Glucose, UA: NEGATIVE mg/dL
Hgb urine dipstick: NEGATIVE
Specific Gravity, Urine: 1.015 (ref 1.005–1.030)
pH: 7 (ref 5.0–8.0)

## 2012-12-09 MED ORDER — BUTORPHANOL TARTRATE 1 MG/ML IJ SOLN
1.0000 mg | Freq: Once | INTRAMUSCULAR | Status: AC
  Administered 2012-12-09: 1 mg via INTRAVENOUS
  Filled 2012-12-09: qty 1

## 2012-12-09 MED ORDER — METOCLOPRAMIDE HCL 5 MG/ML IJ SOLN
10.0000 mg | Freq: Once | INTRAMUSCULAR | Status: AC
  Administered 2012-12-09: 10 mg via INTRAVENOUS
  Filled 2012-12-09: qty 2

## 2012-12-09 MED ORDER — LACTATED RINGERS IV BOLUS (SEPSIS)
500.0000 mL | Freq: Once | INTRAVENOUS | Status: AC
  Administered 2012-12-09: 500 mL via INTRAVENOUS

## 2012-12-09 MED ORDER — LACTATED RINGERS IV SOLN: INTRAVENOUS | Status: DC

## 2012-12-09 NOTE — MAU Note (Signed)
Oxygen discontinued after 20 minutes. Patient states her headache is still the same.

## 2012-12-09 NOTE — MAU Provider Note (Signed)
Margaret Cameron is a 32 y.o. female, G5P1031 at [redacted]w[redacted]d, presenting for HA and diarrhea.  Seen by Nigel Bridgeman, see her MAU note.    Blood pressure 125/63, pulse 76, temperature 97.9 F (36.6 C), temperature source Oral, resp. rate 20, height 5\' 6"  (1.676 m), weight 209 lb (94.802 kg), last menstrual period 06/06/2012.   Assessment/Plan: Pt reports HA is improved. No BM since admission  Discussed BRAT diet Encouraged her to call if sx come back or worsen Encouraged to rest today and hydrate  D/C home F/u with already scheduled ROB tomorrow 7/21  Rowan Blase, MSN 12/09/2012, 9:27 AM

## 2012-12-09 NOTE — MAU Note (Signed)
Patient complains of a headache for the last three days followed by diarrhea and sharp abdominal pain.

## 2012-12-09 NOTE — MAU Provider Note (Signed)
History   32 yo G5P1031 at 33 3/7 weeks presented unannounced c/o HA x 3 days, unrelieved by Tylenol, photophobia, sporadic sharp bilateral upper abdominal pain, and diarrhea since last night (approx 5 times).  Had fever on Thursday to 101, denies nausea, dysuria, URI sx, viral exposures, d/c, contractions, or vaginal bleeding.  Reports +FM.  Denies visual sx other than photophobia, no epigastric pain.  No neurological sx.  Has had HAs during pregnancy, but relieved with Ibuprophen or Tylenol until this event.   Patient Active Problem List   Diagnosis Date Noted  . GBS (group B Streptococcus carrier), +RV culture, currently pregnant 12/09/2012  . Hx LEEP (loop electrosurgical excision procedure), cervix, pregnancy 12/09/2012  . Hx of pyelonephritis 12/09/2012  . Pilonidal cyst without abscess 08/29/2011  . Complete legally induced abortion 04/13/2011  Had PROM at 16 weeks, with chorioamnionitis--induced with cytotech 2012   Chief Complaint  Patient presents with  . Headache  . Diarrhea  . Abdominal Pain    OB History   Grav Para Term Preterm Abortions TAB SAB Ect Mult Living   5 1 1  0 3 1 2  0 0 1      Past Medical History  Diagnosis Date  . Pyelonephritis     x 3 episodes  . Abnormal Pap smear of cervix     LEEP 2011  . Hypoglycemia   . Anemia     with pregnancy  . Chronic headaches   . Cyst near coccyx   . Pilonidal cyst     Past Surgical History  Procedure Laterality Date  . Dilation and curettage of uterus  2008  . Leep  2011  . Other surgical history      Pynlodial cyst removed from tail bone 2012  . Other surgical history  10/2010    removal of cyst near coxxyx  . Pilonidal cyst excision  10/2010    Family History  Problem Relation Age of Onset  . Hypertension Brother   . Cancer Maternal Aunt     breast    History  Substance Use Topics  . Smoking status: Former Smoker -- 0.10 packs/day    Types: Cigarettes  . Smokeless tobacco: Never Used  .  Alcohol Use: No    Allergies:  Allergies  Allergen Reactions  . Codeine Rash    Mild rash, took once with benadryl and NO rash, Took Morphine with NO side effect  . Septra (Bactrim) Rash    Prescriptions prior to admission  Medication Sig Dispense Refill  . acetaminophen (TYLENOL) 500 MG tablet Take 1,000 mg by mouth every 6 (six) hours as needed for pain.      . Multiple Vitamin (MULTIVITAMIN WITH MINERALS) TABS Take 1 tablet by mouth daily.         Physical Exam   Blood pressure 125/63, pulse 76, temperature 97.9 F (36.6 C), temperature source Oral, resp. rate 20, height 5\' 6"  (1.676 m), weight 209 lb (94.802 kg), last menstrual period 06/06/2012.  Patient in dark room, due to photophobia and HA. Full ROM all extremities and normal sensorium. Ear drums clear--small amount cerumen in canal. PERL, but very light sensitive. Chest clear Heart RRR without murmur Abd gravid, NT.  No rebound or guarding, +bowel sounds in all quadrants. Negative CVAT Pelvic--deferred Ext WNL  FHR Reassuring for EGA No contractions  ED Course  IUP at 27 3/7 weeks Migraneous HA Diarrhea  Plan: Implemented migraine protocol--IV fluids, O2 per face mask, Stadol, Reglan. CBC, diff, CMP Stool  culture if obtainable UA Deferred Immodium at present.  Nigel Bridgeman CNM, [place] 12/09/2012 7:31 AM

## 2013-01-11 ENCOUNTER — Inpatient Hospital Stay (HOSPITAL_COMMUNITY)
Admission: AD | Admit: 2013-01-11 | Discharge: 2013-01-11 | Disposition: A | Discharge status: Home / Self Care | Source: Ambulatory Visit | Attending: Obstetrics and Gynecology | Admitting: Obstetrics and Gynecology

## 2013-01-11 ENCOUNTER — Encounter (HOSPITAL_COMMUNITY): Payer: Self-pay | Admitting: *Deleted

## 2013-01-11 DIAGNOSIS — O99019 Anemia complicating pregnancy, unspecified trimester: Secondary | ICD-10-CM | POA: Insufficient documentation

## 2013-01-11 DIAGNOSIS — D573 Sickle-cell trait: Secondary | ICD-10-CM | POA: Insufficient documentation

## 2013-01-11 DIAGNOSIS — O4700 False labor before 37 completed weeks of gestation, unspecified trimester: Secondary | ICD-10-CM | POA: Insufficient documentation

## 2013-01-11 HISTORY — DX: Sickle-cell trait: D57.3

## 2013-01-11 MED ORDER — LACTATED RINGERS IV BOLUS (SEPSIS)
1000.0000 mL | Freq: Once | INTRAVENOUS | Status: AC
  Administered 2013-01-11: 1000 mL via INTRAVENOUS

## 2013-01-11 MED ORDER — NIFEDIPINE 10 MG PO CAPS: 10.0000 mg | ORAL_CAPSULE | ORAL | Status: DC | PRN

## 2013-01-11 NOTE — Consult Note (Addendum)
DATE: 01/11/2013  Maternity Admissions Unit History and Physical Exam for an Obstetrics Patient  Ms. Evoleth Nordmeyer is a 32 y.o. female, Z6X0960, at [redacted]w[redacted]d gestation, who presents for evaluation of contractions. She has been followed at the [facility] and Gynecology division of [facility].  Her pregnancy has been complicated by sickle cell trait. See history below.  The patient reports having painful contractions yesterday. She was evaluated in the office. She was sent to maternity admissions for management.  OB History   Grav Para Term Preterm Abortions TAB SAB Ect Mult Living   5 1 1  0 3 1 2  0 0 1      Past Medical History  Diagnosis Date  . Pyelonephritis     x 3 episodes  . Abnormal Pap smear of cervix     LEEP 2011  . Hypoglycemia   . Anemia     with pregnancy  . Chronic headaches   . Cyst near coccyx   . Pilonidal cyst   . Sickle cell trait     Prescriptions prior to admission  Medication Sig Dispense Refill  . acetaminophen (TYLENOL) 500 MG tablet Take 1,000 mg by mouth every 6 (six) hours as needed for pain.      . Multiple Vitamin (MULTIVITAMIN WITH MINERALS) TABS Take 1 tablet by mouth daily.        Past Surgical History  Procedure Laterality Date  . Dilation and curettage of uterus  2008  . Leep  2011  . Other surgical history      Pynlodial cyst removed from tail bone 2012  . Other surgical history  10/2010    removal of cyst near coxxyx  . Pilonidal cyst excision  10/2010    Allergies  Allergen Reactions  . Codeine Rash    Mild rash, took once with benadryl and NO rash, Took Morphine with NO side effect  . Septra [Bactrim] Rash    Family History: family history includes Cancer in her maternal aunt; Hypertension in her brother.  Social History:  reports that she quit smoking about 5 months ago. Her smoking use included Cigarettes. She smoked 0.10 packs per day. She has never used smokeless tobacco. She reports that  she does not drink alcohol or use illicit drugs.  Review of systems: Normal pregnancy complaints.  Admission Physical Exam:  BP 117/56  Pulse 63  Resp 18  LMP 06/06/2012   Dilation: Fingertip Effacement (%): Thick Station: Ballotable Exam by:: Dr. Stefano Gaul There is no weight on file to calculate BMI.  Blood pressure 117/56, pulse 63, resp. rate 18, last menstrual period 06/06/2012.  HEENT:                 Within normal limits Chest:                   Clear Heart:                    Regular rate and rhythm Abdomen:             Gravid and nontender Extremities:          Grossly normal Neurologic exam: Grossly normal Pelvic exam:         Cervix: Closed, 20% effaced, ballotable  NST: Category 1; Contractions: Irregular  Maternity admissions the patient was given IV fluids. Her contraction pain improved. Her cervix was examined and there was no cervical change. We felt it was appropriate to discharge the patient  to home.   Assessment:  [redacted]w[redacted]d gestation  Preterm uterine contractions  Plan:  The patient will be discharged to home to rest.  She will use Procardia 10 mg every 4 hours as needed for contractions. She is to call for any questions or concerns.  The patient will return to the office in one week. We will follow up on her office testing.   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   [facility] [address] [place], [place] [address]  01/11/2013   To Whom It May Concern:  Ms. Farryn Linares (date of birth 12-Nov-1980) is currently [redacted] weeks pregnant. She was seen in the emergency department today because of contractions. I have treated the patient with medication. I have recommended that she remain on bed rest. 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 Thank you very much for your cooperation during this  medical illness. Sincerely,  Mylinda Latina.D.

## 2013-02-08 ENCOUNTER — Encounter: Payer: Self-pay | Admitting: Obstetrics and Gynecology

## 2013-02-24 ENCOUNTER — Encounter (HOSPITAL_COMMUNITY): Payer: Self-pay | Admitting: *Deleted

## 2013-02-24 ENCOUNTER — Encounter (HOSPITAL_COMMUNITY): Payer: Self-pay | Admitting: Anesthesiology

## 2013-02-24 ENCOUNTER — Inpatient Hospital Stay (HOSPITAL_COMMUNITY)
Admission: AD | Admit: 2013-02-24 | Discharge: 2013-02-26 | DRG: 775 | Disposition: A | Discharge status: Home / Self Care | Source: Ambulatory Visit | Attending: Obstetrics and Gynecology | Admitting: Obstetrics and Gynecology

## 2013-02-24 ENCOUNTER — Inpatient Hospital Stay (HOSPITAL_COMMUNITY): Admitting: Anesthesiology

## 2013-02-24 DIAGNOSIS — O99892 Other specified diseases and conditions complicating childbirth: Principal | ICD-10-CM | POA: Diagnosis present

## 2013-02-24 DIAGNOSIS — O039 Complete or unspecified spontaneous abortion without complication: Secondary | ICD-10-CM | POA: Diagnosis not present

## 2013-02-24 DIAGNOSIS — D649 Anemia, unspecified: Secondary | ICD-10-CM

## 2013-02-24 DIAGNOSIS — O34599 Maternal care for other abnormalities of gravid uterus, unspecified trimester: Secondary | ICD-10-CM | POA: Diagnosis present

## 2013-02-24 DIAGNOSIS — O2620 Pregnancy care for patient with recurrent pregnancy loss, unspecified trimester: Secondary | ICD-10-CM | POA: Diagnosis not present

## 2013-02-24 DIAGNOSIS — O9902 Anemia complicating childbirth: Secondary | ICD-10-CM | POA: Diagnosis present

## 2013-02-24 DIAGNOSIS — O2390 Unspecified genitourinary tract infection in pregnancy, unspecified trimester: Secondary | ICD-10-CM | POA: Diagnosis present

## 2013-02-24 DIAGNOSIS — D4959 Neoplasm of unspecified behavior of other genitourinary organ: Secondary | ICD-10-CM | POA: Diagnosis present

## 2013-02-24 DIAGNOSIS — N390 Urinary tract infection, site not specified: Secondary | ICD-10-CM | POA: Diagnosis not present

## 2013-02-24 DIAGNOSIS — B951 Streptococcus, group B, as the cause of diseases classified elsewhere: Secondary | ICD-10-CM | POA: Diagnosis present

## 2013-02-24 DIAGNOSIS — Z22330 Carrier of Group B streptococcus: Secondary | ICD-10-CM

## 2013-02-24 DIAGNOSIS — B9620 Unspecified Escherichia coli [E. coli] as the cause of diseases classified elsewhere: Secondary | ICD-10-CM | POA: Diagnosis not present

## 2013-02-24 DIAGNOSIS — Z87891 Personal history of nicotine dependence: Secondary | ICD-10-CM

## 2013-02-24 DIAGNOSIS — A498 Other bacterial infections of unspecified site: Secondary | ICD-10-CM | POA: Diagnosis present

## 2013-02-24 DIAGNOSIS — O2340 Unspecified infection of urinary tract in pregnancy, unspecified trimester: Secondary | ICD-10-CM | POA: Diagnosis present

## 2013-02-24 DIAGNOSIS — D251 Intramural leiomyoma of uterus: Secondary | ICD-10-CM | POA: Diagnosis present

## 2013-02-24 LAB — CBC
Hemoglobin: 11.8 g/dL — ABNORMAL LOW (ref 12.0–15.0)
MCH: 29.4 pg (ref 26.0–34.0)
MCHC: 34.4 g/dL (ref 30.0–36.0)
MCV: 85.5 fL (ref 78.0–100.0)
Platelets: 232 10*3/uL (ref 150–400)
RDW: 13.8 % (ref 11.5–15.5)
WBC: 12.4 10*3/uL — ABNORMAL HIGH (ref 4.0–10.5)

## 2013-02-24 LAB — ABO/RH: ABO/RH(D): O POS

## 2013-02-24 LAB — TYPE AND SCREEN: Antibody Screen: NEGATIVE

## 2013-02-24 MED ORDER — BENZOCAINE-MENTHOL 20-0.5 % EX AERO: 1.0000 "application " | INHALATION_SPRAY | CUTANEOUS | Status: DC | PRN

## 2013-02-24 MED ORDER — FENTANYL 2.5 MCG/ML BUPIVACAINE 1/10 % EPIDURAL INFUSION (WH - ANES)
INTRAMUSCULAR | Status: DC | PRN
  Administered 2013-02-24: 14 mL/h via EPIDURAL

## 2013-02-24 MED ORDER — PRENATAL MULTIVITAMIN CH
1.0000 | ORAL_TABLET | Freq: Every day | ORAL | Status: DC
  Administered 2013-02-25 – 2013-02-26 (×2): 1 via ORAL
  Filled 2013-02-24 (×2): qty 1

## 2013-02-24 MED ORDER — DIBUCAINE 1 % RE OINT: 1.0000 "application " | TOPICAL_OINTMENT | RECTAL | Status: DC | PRN

## 2013-02-24 MED ORDER — OXYTOCIN BOLUS FROM INFUSION
500.0000 mL | INTRAVENOUS | Status: DC
  Administered 2013-02-24: 500 mL via INTRAVENOUS

## 2013-02-24 MED ORDER — ONDANSETRON HCL 4 MG/2ML IJ SOLN
4.0000 mg | Freq: Four times a day (QID) | INTRAMUSCULAR | Status: DC | PRN
  Administered 2013-02-24: 4 mg via INTRAVENOUS
  Filled 2013-02-24: qty 2

## 2013-02-24 MED ORDER — LIDOCAINE HCL (PF) 1 % IJ SOLN
30.0000 mL | INTRAMUSCULAR | Status: DC | PRN
  Filled 2013-02-24 (×2): qty 30

## 2013-02-24 MED ORDER — PHENYLEPHRINE 40 MCG/ML (10ML) SYRINGE FOR IV PUSH (FOR BLOOD PRESSURE SUPPORT)
80.0000 ug | PREFILLED_SYRINGE | INTRAVENOUS | Status: DC | PRN
  Filled 2013-02-24: qty 2
  Filled 2013-02-24 (×2): qty 5

## 2013-02-24 MED ORDER — DEXTROSE 5 % IV SOLN
5.0000 10*6.[IU] | Freq: Once | INTRAVENOUS | Status: AC
  Administered 2013-02-24: 5 10*6.[IU] via INTRAVENOUS
  Filled 2013-02-24: qty 5

## 2013-02-24 MED ORDER — LACTATED RINGERS IV SOLN
500.0000 mL | Freq: Once | INTRAVENOUS | Status: AC
  Administered 2013-02-24: 500 mL via INTRAVENOUS

## 2013-02-24 MED ORDER — IBUPROFEN 600 MG PO TABS
600.0000 mg | ORAL_TABLET | Freq: Four times a day (QID) | ORAL | Status: DC
  Administered 2013-02-25 – 2013-02-26 (×6): 600 mg via ORAL
  Filled 2013-02-24 (×6): qty 1

## 2013-02-24 MED ORDER — SENNOSIDES-DOCUSATE SODIUM 8.6-50 MG PO TABS
2.0000 | ORAL_TABLET | ORAL | Status: DC
Start: 2013-02-25 — End: 2013-02-26
  Administered 2013-02-25: 2 via ORAL

## 2013-02-24 MED ORDER — EPHEDRINE 5 MG/ML INJ
10.0000 mg | INTRAVENOUS | Status: DC | PRN
  Filled 2013-02-24: qty 4
  Filled 2013-02-24: qty 2

## 2013-02-24 MED ORDER — ONDANSETRON HCL 4 MG/2ML IJ SOLN: 4.0000 mg | INTRAMUSCULAR | Status: DC | PRN

## 2013-02-24 MED ORDER — TETANUS-DIPHTH-ACELL PERTUSSIS 5-2.5-18.5 LF-MCG/0.5 IM SUSP: 0.5000 mL | Freq: Once | INTRAMUSCULAR | Status: DC

## 2013-02-24 MED ORDER — LACTATED RINGERS IV SOLN
INTRAVENOUS | Status: DC
  Administered 2013-02-24: 19:00:00 via INTRAVENOUS

## 2013-02-24 MED ORDER — PHENYLEPHRINE 40 MCG/ML (10ML) SYRINGE FOR IV PUSH (FOR BLOOD PRESSURE SUPPORT)
80.0000 ug | PREFILLED_SYRINGE | INTRAVENOUS | Status: DC | PRN
  Filled 2013-02-24: qty 2

## 2013-02-24 MED ORDER — LACTATED RINGERS IV BOLUS (SEPSIS): 125.0000 mL | Freq: Once | INTRAVENOUS | Status: DC

## 2013-02-24 MED ORDER — IBUPROFEN 600 MG PO TABS
600.0000 mg | ORAL_TABLET | Freq: Four times a day (QID) | ORAL | Status: DC | PRN
  Administered 2013-02-24: 600 mg via ORAL
  Filled 2013-02-24: qty 1

## 2013-02-24 MED ORDER — WITCH HAZEL-GLYCERIN EX PADS: 1.0000 "application " | MEDICATED_PAD | CUTANEOUS | Status: DC | PRN

## 2013-02-24 MED ORDER — EPHEDRINE 5 MG/ML INJ
10.0000 mg | INTRAVENOUS | Status: DC | PRN
  Filled 2013-02-24: qty 2

## 2013-02-24 MED ORDER — PENICILLIN G POTASSIUM 5000000 UNITS IJ SOLR
2.5000 10*6.[IU] | INTRAVENOUS | Status: DC
  Filled 2013-02-24 (×5): qty 2.5

## 2013-02-24 MED ORDER — LIDOCAINE HCL (PF) 1 % IJ SOLN
INTRAMUSCULAR | Status: DC | PRN
  Administered 2013-02-24 (×3): 5 mL

## 2013-02-24 MED ORDER — LACTATED RINGERS IV SOLN: 500.0000 mL | INTRAVENOUS | Status: DC | PRN

## 2013-02-24 MED ORDER — CITRIC ACID-SODIUM CITRATE 334-500 MG/5ML PO SOLN: 30.0000 mL | ORAL | Status: DC | PRN

## 2013-02-24 MED ORDER — OXYTOCIN 40 UNITS IN LACTATED RINGERS INFUSION - SIMPLE MED
62.5000 mL/h | INTRAVENOUS | Status: DC
  Filled 2013-02-24: qty 1000

## 2013-02-24 MED ORDER — SIMETHICONE 80 MG PO CHEW: 80.0000 mg | CHEWABLE_TABLET | ORAL | Status: DC | PRN

## 2013-02-24 MED ORDER — ACETAMINOPHEN 325 MG PO TABS: 650.0000 mg | ORAL_TABLET | ORAL | Status: DC | PRN

## 2013-02-24 MED ORDER — ZOLPIDEM TARTRATE 5 MG PO TABS: 5.0000 mg | ORAL_TABLET | Freq: Every evening | ORAL | Status: DC | PRN

## 2013-02-24 MED ORDER — OXYCODONE-ACETAMINOPHEN 5-325 MG PO TABS
1.0000 | ORAL_TABLET | ORAL | Status: DC | PRN
  Administered 2013-02-26: 1 via ORAL
  Filled 2013-02-24: qty 1

## 2013-02-24 MED ORDER — ONDANSETRON HCL 4 MG PO TABS: 4.0000 mg | ORAL_TABLET | ORAL | Status: DC | PRN

## 2013-02-24 MED ORDER — LANOLIN HYDROUS EX OINT: TOPICAL_OINTMENT | CUTANEOUS | Status: DC | PRN

## 2013-02-24 MED ORDER — DIPHENHYDRAMINE HCL 25 MG PO CAPS: 25.0000 mg | ORAL_CAPSULE | Freq: Four times a day (QID) | ORAL | Status: DC | PRN

## 2013-02-24 MED ORDER — DIPHENHYDRAMINE HCL 50 MG/ML IJ SOLN: 12.5000 mg | INTRAMUSCULAR | Status: DC | PRN

## 2013-02-24 MED ORDER — FENTANYL 2.5 MCG/ML BUPIVACAINE 1/10 % EPIDURAL INFUSION (WH - ANES)
14.0000 mL/h | INTRAMUSCULAR | Status: DC | PRN
  Filled 2013-02-24: qty 125

## 2013-02-24 NOTE — Anesthesia Procedure Notes (Signed)
Epidural Patient location during procedure: OB  Staffing Anesthesiologist: Juliany Daughety Performed by: anesthesiologist   Preanesthetic Checklist Completed: patient identified, site marked, surgical consent, pre-op evaluation, timeout performed, IV checked, risks and benefits discussed and monitors and equipment checked  Epidural Patient position: sitting Prep: ChloraPrep Patient monitoring: heart rate, continuous pulse ox and blood pressure Approach: right paramedian Injection technique: LOR saline  Needle:  Needle type: Tuohy  Needle gauge: 17 G Needle length: 9 cm and 9 Needle insertion depth: 6 cm Catheter type: closed end flexible Catheter size: 20 Guage Catheter at skin depth: 10 cm Test dose: negative  Assessment Events: blood not aspirated, injection not painful, no injection resistance, negative IV test and no paresthesia  Additional Notes   Patient tolerated the insertion well without complications.   

## 2013-02-24 NOTE — Anesthesia Preprocedure Evaluation (Signed)
Anesthesia Evaluation  Patient identified by MRN, date of birth, ID band Patient awake    Reviewed: Allergy & Precautions, H&P , NPO status , Patient's Chart, lab work & pertinent test results  History of Anesthesia Complications Negative for: history of anesthetic complications  Airway Mallampati: II TM Distance: >3 FB Neck ROM: full    Dental no notable dental hx. (+) Teeth Intact   Pulmonary neg pulmonary ROS,  breath sounds clear to auscultation  Pulmonary exam normal       Cardiovascular negative cardio ROS  Rhythm:regular Rate:Normal     Neuro/Psych negative neurological ROS  negative psych ROS   GI/Hepatic negative GI ROS, Neg liver ROS,   Endo/Other  negative endocrine ROS  Renal/GU negative Renal ROS  negative genitourinary   Musculoskeletal   Abdominal Normal abdominal exam  (+)   Peds  Hematology negative hematology ROS (+) Blood dyscrasia, Sickle cell trait ,   Anesthesia Other Findings   Reproductive/Obstetrics (+) Pregnancy                           Anesthesia Physical Anesthesia Plan  ASA: II  Anesthesia Plan: Epidural   Post-op Pain Management:    Induction:   Airway Management Planned:   Additional Equipment:   Intra-op Plan:   Post-operative Plan:   Informed Consent: I have reviewed the patients History and Physical, chart, labs and discussed the procedure including the risks, benefits and alternatives for the proposed anesthesia with the patient or authorized representative who has indicated his/her understanding and acceptance.     Plan Discussed with:   Anesthesia Plan Comments:         Anesthesia Quick Evaluation

## 2013-02-24 NOTE — MAU Note (Signed)
Specimen obtained and sent to lab for [facility] results.

## 2013-02-24 NOTE — H&P (Signed)
Margaret Cameron is a 32 y.o. female, Z6X0960 at 25 3/7 weeks, presenting with contractions of increasing frequency and intensity since early afternoon.  Denies leaking or bleeding, reports +FM.   Patient Active Problem List   Diagnosis Date Noted  . SAB (spontaneous abortion) in 2nd trimester 2012--16 weeks, PROM, induced 02/25/2013  . Two previous spontaneous abortions (SAB) affecting care of mother, antepartum 02/25/2013  . GBS (group B streptococcus) UTI complicating pregnancy 02/25/2013  . E-coli UTI 02/25/2013  . Vaginal delivery 02/24/2013  . Hx LEEP (loop electrosurgical excision procedure), cervix, pregnancy 12/09/2012  . Hx of pyelonephritis 12/09/2012  . Anemia 12/09/2012  . Pilonidal cyst without abscess 08/29/2011    History of present pregnancy: Patient entered care at 21 weeks at [facility].   EDC of was established by [place] in MAU on 08/10/12 at 10 1/7 weeks   Anatomy scan:  21 weeks, with normal findings and an posterior placenta.   Additional [place] evaluations:   31 1/7 weeks due to abdominal pain--EFW 4+3, 55%ile, AFI 20.7, 80%ile, small intramural fibroid, 2 x 1.3 x 2.9. 34 weeks, due to S>D--EFW 5+5, 57%ile, AFI 19.7, 75%ile, vtx. Significant prenatal events:  GBS and Ecoli noted on initial urine culture at [facility]--treated with Keflex, negative TOC.   Abdominal pain at 32 weeks, with finding of small fibroid, negative FFN.  Given prn Procardia.     Last evaluation:  9/30 at office, no cervical exam.  OB History   Grav Para Term Preterm Abortions TAB SAB Ect Mult Living   5 2 2  0 3 1 2  0 0 2    2009--SVB, female (another hospital) 2012--16 week PROM, induced, non-viable, [facility] with [facility] EPIC record notes another TAB and another SAB, but no dates given.  These were not reported by the patient during her care at [facility].   Past Medical History  Diagnosis Date  . Pyelonephritis     x 3 episodes  . Abnormal Pap smear of cervix     LEEP 2011  . Hypoglycemia   . Anemia      with pregnancy  . Chronic headaches   . Cyst near coccyx   . Pilonidal cyst   . Sickle cell trait    Past Surgical History  Procedure Laterality Date  . Dilation and curettage of uterus  2008  . Leep  2011  . Other surgical history      Pynlodial cyst removed from tail bone 2012  . Other surgical history  10/2010    removal of cyst near coxxyx  . Pilonidal cyst excision  10/2010   Family History: family history includes Cancer in her maternal aunt; Hypertension in her brother.  Social History:  reports that she quit smoking about 7 months ago. Her smoking use included Cigarettes. She smoked 0.10 packs per day. She has never used smokeless tobacco. She reports that she does not drink alcohol or use illicit drugs.  FOB, Casimiro Needle, is involved and supportive.  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  She has 2 years of college.   Prenatal Transfer Tool  Maternal Diabetes: No Genetic Screening: Declined Maternal Ultrasounds/Referrals: Normal Fetal Ultrasounds or other Referrals:  None Maternal Substance Abuse:  No Significant Maternal Medications:  None Significant Maternal Lab Results: Lab values include: Group B Strep positive  ROS:  Contractions, +FM  Allergies  Allergen Reactions  . Codeine Rash    Mild rash, took once with benadryl and NO rash, Took Morphine  with NO side effect  . Septra [Bactrim] Rash     Dilation: Lip/rim Effacement (%): 100 Station: -1 Exam by:: Emilee Hero CNM Blood pressure 110/68, pulse 88, temperature 98.9 F (37.2 C), temperature source Oral, resp. rate 20, height 5\' 6"  (1.676 m), weight 223 lb (101.152 kg), last menstrual period 06/06/2012, SpO2 100.00%, unknown if currently breastfeeding.  Chest clear Heart RRR without murmur Abd gravid, NT, FH 39 cm Pelvic: initially 3 cm, 100% by J. Oxley, CNM at 5:15--now 5 cm, 100, vtx, -1, BBOW on recheck by this CNM Ext: WNL  FHR: Category 1 UCs:  q 3-4 min,  moderate.  Prenatal labs: ABO, Rh: --/--/O POS, O POS (10/05 1525)O+ Antibody: NEG (10/05 1525)Neg Rubella:   Immune RPR: NON REACTIVE (10/05 1525) NR HBsAg:   Neg HIV:   NR GBS: Positive (07/07 0000)--noted on urine culture 10/16/12--treated with Keflex. Sickle cell/Hgb electrophoresis:  Hgb AS Pap:  ASCUS at [facility], 08/2012. GC/Chlamydia:  Negative 5/21 and 01/11/13* Genetic screenings:  Declined Glucola:  WNL Other:  FFN negative 8/22 Urine culture + Ecoli and GBS at [facility] 5/21, negative 7/7 on TOC.   Assessment/Plan: IUP at 38 3/7 weeks Active labor GBS positive Desires epidural  Plan: Admit to Birthing Suite per consult with Dr. Estanislado Pandy Routine [facility] orders GBS prophylaxis with PCN G. Epidural prn.  Dimitriy Carreras, VICKICNM, [place] 02/25/2013, 2:09 AM

## 2013-02-24 NOTE — Progress Notes (Signed)
Notified pt in MAU for labor eval. Pt stated had urge to push however RN checked pt and she is 3cm. CNM will come evaluate pt.

## 2013-02-25 ENCOUNTER — Encounter (HOSPITAL_COMMUNITY)
Admission: AD | Disposition: A | Discharge status: Home / Self Care | Payer: Self-pay | Source: Ambulatory Visit | Attending: Obstetrics and Gynecology

## 2013-02-25 ENCOUNTER — Encounter (HOSPITAL_COMMUNITY): Payer: Self-pay | Admitting: Anesthesiology

## 2013-02-25 DIAGNOSIS — O2620 Pregnancy care for patient with recurrent pregnancy loss, unspecified trimester: Secondary | ICD-10-CM | POA: Diagnosis not present

## 2013-02-25 DIAGNOSIS — B9620 Unspecified Escherichia coli [E. coli] as the cause of diseases classified elsewhere: Secondary | ICD-10-CM | POA: Diagnosis not present

## 2013-02-25 DIAGNOSIS — B951 Streptococcus, group B, as the cause of diseases classified elsewhere: Secondary | ICD-10-CM | POA: Diagnosis present

## 2013-02-25 DIAGNOSIS — O039 Complete or unspecified spontaneous abortion without complication: Secondary | ICD-10-CM | POA: Diagnosis not present

## 2013-02-25 LAB — CBC
HCT: 28.6 % — ABNORMAL LOW (ref 36.0–46.0)
MCHC: 35 g/dL (ref 30.0–36.0)
Platelets: 199 10*3/uL (ref 150–400)
RDW: 13.8 % (ref 11.5–15.5)
WBC: 15 10*3/uL — ABNORMAL HIGH (ref 4.0–10.5)

## 2013-02-25 LAB — SURGICAL PCR SCREEN: Staphylococcus aureus: NEGATIVE

## 2013-02-25 SURGERY — LIGATION, FALLOPIAN TUBE, POSTPARTUM
Anesthesia: Epidural | Site: Abdomen | Laterality: Bilateral | Wound class: Clean Contaminated

## 2013-02-25 MED ORDER — METOCLOPRAMIDE HCL 10 MG PO TABS
10.0000 mg | ORAL_TABLET | Freq: Once | ORAL | Status: AC
  Administered 2013-02-25: 10 mg via ORAL
  Filled 2013-02-25: qty 1

## 2013-02-25 MED ORDER — MIDAZOLAM HCL 2 MG/2ML IJ SOLN
INTRAMUSCULAR | Status: AC
  Filled 2013-02-25: qty 2

## 2013-02-25 MED ORDER — BUPIVACAINE HCL (PF) 0.25 % IJ SOLN
INTRAMUSCULAR | Status: AC
  Filled 2013-02-25: qty 30

## 2013-02-25 MED ORDER — FAMOTIDINE 20 MG PO TABS
40.0000 mg | ORAL_TABLET | Freq: Once | ORAL | Status: AC
  Administered 2013-02-25: 40 mg via ORAL
  Filled 2013-02-25: qty 2

## 2013-02-25 MED ORDER — LIDOCAINE HCL (CARDIAC) 20 MG/ML IV SOLN
INTRAVENOUS | Status: AC
  Filled 2013-02-25: qty 5

## 2013-02-25 MED ORDER — LACTATED RINGERS IV SOLN: INTRAVENOUS | Status: DC

## 2013-02-25 SURGICAL SUPPLY — 25 items
BENZOIN TINCTURE PRP APPL 2/3 (GAUZE/BANDAGES/DRESSINGS) IMPLANT
CHLORAPREP W/TINT 26ML (MISCELLANEOUS) IMPLANT
CLOTH BEACON ORANGE TIMEOUT ST (SAFETY) IMPLANT
CONTAINER PREFILL 10% NBF 15ML (MISCELLANEOUS) IMPLANT
DERMABOND ADVANCED (GAUZE/BANDAGES/DRESSINGS)
DERMABOND ADVANCED .7 DNX12 (GAUZE/BANDAGES/DRESSINGS) IMPLANT
ELECT REM PT RETURN 9FT ADLT (ELECTROSURGICAL)
ELECTRODE REM PT RTRN 9FT ADLT (ELECTROSURGICAL) IMPLANT
GLOVE BIO SURGEON STRL SZ7.5 (GLOVE) IMPLANT
GLOVE BIOGEL PI IND STRL 7.5 (GLOVE) IMPLANT
GLOVE BIOGEL PI INDICATOR 7.5 (GLOVE)
GOWN PREVENTION PLUS LG XLONG (DISPOSABLE) IMPLANT
NEEDLE HYPO 25X1 1.5 SAFETY (NEEDLE) IMPLANT
NS IRRIG 1000ML POUR BTL (IV SOLUTION) IMPLANT
PACK ABDOMINAL MINOR (CUSTOM PROCEDURE TRAY) IMPLANT
PENCIL BUTTON HOLSTER BLD 10FT (ELECTRODE) IMPLANT
SPONGE LAP 4X18 X RAY DECT (DISPOSABLE) IMPLANT
STRIP CLOSURE SKIN 1/4X3 (GAUZE/BANDAGES/DRESSINGS) IMPLANT
SUT MNCRL AB 3-0 PS2 27 (SUTURE) IMPLANT
SUT PLAIN 0 NONE (SUTURE) IMPLANT
SUT VIC AB 0 CT1 36 (SUTURE) IMPLANT
SYR CONTROL 10ML LL (SYRINGE) IMPLANT
TOWEL OR 17X24 6PK STRL BLUE (TOWEL DISPOSABLE) IMPLANT
TRAY FOLEY CATH 14FR (SET/KITS/TRAYS/PACK) IMPLANT
WATER STERILE IRR 1000ML POUR (IV SOLUTION) IMPLANT

## 2013-02-25 NOTE — Anesthesia Postprocedure Evaluation (Signed)
Anesthesia Post Note  Patient: Therapist, sports  Procedure(s) Performed: * No procedures listed *  Anesthesia type: Epidural  Patient location: Mother/Baby  Post pain: Pain level controlled  Post assessment: Post-op Vital signs reviewed  Last Vitals:  Filed Vitals:   02/25/13 0444  BP: 101/68  Pulse: 76  Temp: 36.7 C  Resp: 20    Post vital signs: Reviewed  Level of consciousness:alert  Complications: No apparent anesthesia complications

## 2013-02-25 NOTE — Lactation Note (Signed)
This note was copied from the chart of Margaret Mackenize Mittelman. Lactation Consultation Note Mom holding baby in craddle position c/o nipple pain.  Mom has superficial nipple cracks at the base of the nipple.  While assessing latch baby slipped off, I assisted mom and instructed on cross cradle hold with pillows to support.  Baby latched well with mom compressing breast well.  Baby was rhythmically sucking, but would pull off and frantically tried to self latch.  Instructed mom on waiting for wide open mouth to prevent nipple damage and to allow depth at the breast.  Baby had a jaw thrust that corrected slightly with tucking baby closer.  Discussed basics and initial assessment [facility] education sheets reviewed with pt and FOB and left with pt.  Discussed feeding frequeceny and output expectations. Comfort gels given and use explained.  Mom reports improved pain with appropriate latch and comfort gels.    Patient Name: Margaret Cameron [id number] Date: 02/25/2013 Reason for consult: Initial assessment;Breast/nipple pain;Difficult latch   Maternal Data    Feeding Feeding Type: Breast Milk Length of feed: 20 min  LATCH Score/Interventions Latch: Repeated attempts needed to sustain latch, nipple held in mouth throughout feeding, stimulation needed to elicit sucking reflex. Intervention(s): Adjust position;Assist with latch;Breast massage;Breast compression  Audible Swallowing: A few with stimulation Intervention(s): Skin to skin;Hand expression Intervention(s): Skin to skin;Hand expression  Type of Nipple: Everted at rest and after stimulation (flatish)  Comfort (Breast/Nipple): Filling, red/small blisters or bruises, mild/mod discomfort (mom has cracks at base of nipples, reports mod pain )  Problem noted: Cracked, bleeding, blisters, bruises;Mild/Moderate discomfort Interventions  (Cracked/bleeding/bruising/blister): Expressed breast milk to nipple Interventions (Mild/moderate discomfort): Hand  expression;Comfort gels  Hold (Positioning): Assistance needed to correctly position infant at breast and maintain latch. Intervention(s): Breastfeeding basics reviewed;Support Pillows;Position options;Skin to skin  LATCH Score: 6  Lactation Tools Discussed/Used Tools: Comfort gels   Consult Status      Lynda Rainwater 02/25/2013, 9:05 PM

## 2013-02-25 NOTE — Progress Notes (Signed)
Post Partum Day 1 Subjective: no complaints, up ad lib, voiding, tolerating PO and + flatus  Objective: Blood pressure 106/58, pulse 81, temperature 98.5 F (36.9 C), temperature source Oral, resp. rate 20, height 5\' 6"  (1.676 m), weight 101.152 kg (223 lb), last menstrual period 06/06/2012, SpO2 100.00%, unknown if currently breastfeeding.  Physical Exam:  General: alert and no distress Lochia: appropriate Uterine Fundus: firm and NT DVT Evaluation: No evidence of DVT seen on physical exam.   Recent Labs  02/24/13 1525 02/25/13 0600  HGB 11.8* 10.0*  HCT 34.3* 28.6*    Assessment/Plan: Plan for discharge tomorrow, Breastfeeding and Contraception changed her mind about tubal (permanent sterilization) and would lilke the Mirena and reviewing the r/b/a with the patient.  Questions answered.  Instructions given.   LOS: 1 day   Margaret Cameron Y 02/25/2013, 2:00 PM

## 2013-02-26 MED ORDER — IBUPROFEN 600 MG PO TABS: 600.0000 mg | ORAL_TABLET | Freq: Four times a day (QID) | ORAL | Status: DC

## 2013-02-26 NOTE — Discharge Summary (Signed)
Obstetric Discharge Summary Reason for Admission: onset of labor Prenatal Procedures: NST Intrapartum Procedures: spontaneous vaginal delivery Postpartum Procedures: none Complications-Operative and Postpartum: none Hemoglobin  Date Value Range Status  02/25/2013 10.0* 12.0 - 15.0 g/dL Final     HCT  Date Value Range Status  02/25/2013 28.6* 36.0 - 46.0 % Final    Physical Exam:  General: alert and cooperative Lochia: appropriate Uterine Fundus: firm Incision: na DVT Evaluation: No evidence of DVT seen on physical exam.  Discharge Diagnoses: Term Pregnancy-delivered  Discharge Information: Date: 02/26/2013 Activity: pelvic rest Diet: routine Medications: PNV and Ibuprofen Condition: stable Instructions: refer to practice specific booklet Discharge to: home Follow-up Information   Follow up with [facility]. Schedule an appointment as soon as possible for a visit in 4 weeks.   Specialty:  Obstetrics and Gynecology   Contact information:   [address] [place] [address] [phone]      Newborn Data: Live born female  Birth Weight: 6 lb 15.5 oz (3160 g) APGAR: 9, 9  Home with mother.  Margaret Cameron A 02/26/2013, 11:20 AM

## 2013-03-28 ENCOUNTER — Other Ambulatory Visit: Payer: Self-pay

## 2013-10-14 ENCOUNTER — Emergency Department (INDEPENDENT_AMBULATORY_CARE_PROVIDER_SITE_OTHER)
Admission: EM | Admit: 2013-10-14 | Discharge: 2013-10-14 | Disposition: A | Discharge status: Home / Self Care | Source: Home / Self Care

## 2013-10-14 ENCOUNTER — Encounter (HOSPITAL_COMMUNITY): Payer: Self-pay | Admitting: Emergency Medicine

## 2013-10-14 DIAGNOSIS — M7918 Myalgia, other site: Secondary | ICD-10-CM

## 2013-10-14 DIAGNOSIS — Y9302 Activity, running: Secondary | ICD-10-CM

## 2013-10-14 DIAGNOSIS — M545 Low back pain, unspecified: Secondary | ICD-10-CM

## 2013-10-14 DIAGNOSIS — S86819A Strain of other muscle(s) and tendon(s) at lower leg level, unspecified leg, initial encounter: Secondary | ICD-10-CM

## 2013-10-14 DIAGNOSIS — M549 Dorsalgia, unspecified: Secondary | ICD-10-CM

## 2013-10-14 DIAGNOSIS — S76119A Strain of unspecified quadriceps muscle, fascia and tendon, initial encounter: Secondary | ICD-10-CM

## 2013-10-14 DIAGNOSIS — S838X9A Sprain of other specified parts of unspecified knee, initial encounter: Secondary | ICD-10-CM

## 2013-10-14 DIAGNOSIS — X58XXXA Exposure to other specified factors, initial encounter: Secondary | ICD-10-CM

## 2013-10-14 MED ORDER — DICLOFENAC SODIUM 1 % TD GEL: 1.0000 "application " | Freq: Four times a day (QID) | TRANSDERMAL | Status: DC

## 2013-10-14 NOTE — ED Provider Notes (Signed)
CSN: [id number]     Arrival date & time 10/14/13  1009 History   First MD Initiated Contact with Patient 10/14/13 1106     Chief Complaint  Patient presents with  . Back Pain   (Consider location/radiation/quality/duration/timing/severity/associated sxs/prior Treatment) HPI Comments: 33 year old female who started running approximately one month ago to prepare for a physical test in the [employer]. Proximally 2 weeks later she developed intermittent sharp pains in the left low back that worsened the more she ran. She also developed pain in the anterior right quadricep muscles associated with ambulation. No injury or falls or known trauma. She has a physical challenge coming at the end of this week.   Past Medical History  Diagnosis Date  . Pyelonephritis     x 3 episodes  . Abnormal Pap smear of cervix     LEEP 2011  . Hypoglycemia   . Anemia     with pregnancy  . Chronic headaches   . Cyst near coccyx   . Pilonidal cyst   . Sickle cell trait    Past Surgical History  Procedure Laterality Date  . Dilation and curettage of uterus  2008  . Leep  2011  . Other surgical history      Pynlodial cyst removed from tail bone 2012  . Other surgical history  10/2010    removal of cyst near coxxyx  . Pilonidal cyst excision  10/2010   Family History  Problem Relation Age of Onset  . Hypertension Brother   . Cancer Maternal Aunt     breast   History  Substance Use Topics  . Smoking status: Former Smoker -- 0.10 packs/day    Types: Cigarettes    Quit date: 07/14/2012  . Smokeless tobacco: Never Used  . Alcohol Use: No   OB History   Grav Para Term Preterm Abortions TAB SAB Ect Mult Living   5 2 2  0 3 1 2  0 0 2     Review of Systems  Constitutional: Positive for activity change. Negative for fever and chills.  HENT: Negative.   Respiratory: Negative.   Cardiovascular: Negative.   Musculoskeletal: Positive for back pain and myalgias.       As per HPI  Skin: Negative for color  change and rash.  Neurological: Negative.     Allergies  Codeine and Septra  Home Medications   Prior to Admission medications   Medication Sig Start Date End Date Taking? Authorizing Provider  acetaminophen (TYLENOL) 500 MG tablet Take 1,000 mg by mouth every 6 (six) hours as needed for pain.    Historical Provider, MD  ibuprofen (ADVIL,MOTRIN) 600 MG tablet Take 1 tablet (600 mg total) by mouth every 6 (six) hours. 02/26/13   Naima A Dillard, MD  Prenatal Vit-Fe Fumarate-FA (PRENATAL MULTIVITAMIN) TABS tablet Take 1 tablet by mouth every morning.    Historical Provider, MD   BP 119/68  Pulse 75  Temp(Src) 97.7 F (36.5 C) (Oral)  Resp 12  SpO2 99%  LMP 09/29/2013  Breastfeeding? No Physical Exam  Nursing note and vitals reviewed. Constitutional: She is oriented to person, place, and time. She appears well-developed and well-nourished. No distress.  HENT:  Head: Normocephalic and atraumatic.  Eyes: EOM are normal.  Neck: Normal range of motion. Neck supple.  Cardiovascular: Normal rate.   Pulmonary/Chest: Effort normal. No respiratory distress.  Musculoskeletal:  Tenderness to the left lower back musculature. Pain is reproduced with patient flexing at the waist. Tenderness to the right quadricep  muscle. No focal weakness or paresthesias. No spinal tenderness, deformity.  Neurological: She is alert and oriented to person, place, and time. No cranial nerve deficit.  Skin: Skin is warm and dry.  Psychiatric: She has a normal mood and affect.    ED Course  Procedures (including critical care time) Labs Review Labs Reviewed - No data to display  Imaging Review No results found.   MDM   1. Back pain   2. Lumbar muscle pain   3. Quadriceps muscle strain     Heat, stretches, diclofenac gel. Stop the running for now Motrin prn Read instructions for rehab.   Start back slowly.   Janne Napoleon, NP 10/14/13 1136

## 2013-10-14 NOTE — ED Provider Notes (Signed)
Medical screening examination/treatment/procedure(s) were performed by resident physician or non-physician practitioner and as supervising physician I was immediately available for consultation/collaboration.   Pauline Good MD.   Billy Fischer, MD 10/14/13 [phone]

## 2013-10-14 NOTE — ED Notes (Signed)
C/o  Lower back pain with sharp shooting pain down right leg.  Denies injury and urinary symptoms.  No relief with warm compresses and otc pain meds.   Symptoms present since 5/21.

## 2013-10-14 NOTE — Discharge Instructions (Signed)
Back Pain, Adult Low back pain is very common. About 1 in 5 people have back pain.The cause of low back pain is rarely dangerous. The pain often gets better over time.About half of people with a sudden onset of back pain feel better in just 2 weeks. About 8 in 10 people feel better by 6 weeks.  CAUSES Some common causes of back pain include:  Strain of the muscles or ligaments supporting the spine.  Wear and tear (degeneration) of the spinal discs.  Arthritis.  Direct injury to the back. DIAGNOSIS Most of the time, the direct cause of low back pain is not known.However, back pain can be treated effectively even when the exact cause of the pain is unknown.Answering your caregiver's questions about your overall health and symptoms is one of the most accurate ways to make sure the cause of your pain is not dangerous. If your caregiver needs more information, he or she may order lab work or imaging tests (X-rays or MRIs).However, even if imaging tests show changes in your back, this usually does not require surgery. HOME CARE INSTRUCTIONS For many people, back pain returns.Since low back pain is rarely dangerous, it is often a condition that people can learn to [facility] their own.   Remain active. It is stressful on the back to sit or stand in one place. Do not sit, drive, or stand in one place for more than 30 minutes at a time. Take short walks on level surfaces as soon as pain allows.Try to increase the length of time you walk each day.  Do not stay in bed.Resting more than 1 or 2 days can delay your recovery.  Do not avoid exercise or work.Your body is made to move.It is not dangerous to be active, even though your back may hurt.Your back will likely heal faster if you return to being active before your pain is gone.  Pay attention to your body when you bend and lift. Many people have less discomfortwhen lifting if they bend their knees, keep the load close to their bodies,and  avoid twisting. Often, the most comfortable positions are those that put less stress on your recovering back.  Find a comfortable position to sleep. Use a firm mattress and lie on your side with your knees slightly bent. If you lie on your back, put a pillow under your knees.  Only take over-the-counter or prescription medicines as directed by your caregiver. Over-the-counter medicines to reduce pain and inflammation are often the most helpful.Your caregiver may prescribe muscle relaxant drugs.These medicines help dull your pain so you can more quickly return to your normal activities and healthy exercise.  Put ice on the injured area.  Put ice in a plastic bag.  Place a towel between your skin and the bag.  Leave the ice on for 15-20 minutes, 03-04 times a day for the first 2 to 3 days. After that, ice and heat may be alternated to reduce pain and spasms.  Ask your caregiver about trying back exercises and gentle massage. This may be of some benefit.  Avoid feeling anxious or stressed.Stress increases muscle tension and can worsen back pain.It is important to recognize when you are anxious or stressed and learn ways to manage it.Exercise is a great option. SEEK MEDICAL CARE IF:  You have pain that is not relieved with rest or medicine.  You have pain that does not improve in 1 week.  You have new symptoms.  You are generally not feeling well. SEEK  IMMEDIATE MEDICAL CARE IF:   You have pain that radiates from your back into your legs.  You develop new bowel or bladder control problems.  You have unusual weakness or numbness in your arms or legs.  You develop nausea or vomiting.  You develop abdominal pain.  You feel faint. Document Released: 05/09/2005 Document Revised: 11/08/2011 Document Reviewed: 09/27/2010 [facility] Patient Information 2014 [place], [place].  Lumbosacral Strain Lumbosacral strain is a strain of any of the parts that make up your lumbosacral  vertebrae. Your lumbosacral vertebrae are the bones that make up the lower third of your backbone. Your lumbosacral vertebrae are held together by muscles and tough, fibrous tissue (ligaments).  CAUSES  A sudden blow to your back can cause lumbosacral strain. Also, anything that causes an excessive stretch of the muscles in the low back can cause this strain. This is typically seen when people exert themselves strenuously, fall, lift heavy objects, bend, or crouch repeatedly. RISK FACTORS  Physically demanding work.  Participation in pushing or pulling sports or sports that require sudden twist of the back (tennis, golf, baseball).  Weight lifting.  Excessive lower back curvature.  Forward-tilted pelvis.  Weak back or abdominal muscles or both.  Tight hamstrings. SIGNS AND SYMPTOMS  Lumbosacral strain may cause pain in the area of your injury or pain that moves (radiates) down your leg.  DIAGNOSIS Your health care provider can often diagnose lumbosacral strain through a physical exam. In some cases, you may need tests such as X-ray exams.  TREATMENT  Treatment for your lower back injury depends on many factors that your clinician will have to evaluate. However, most treatment will include the use of anti-inflammatory medicines. HOME CARE INSTRUCTIONS   Avoid hard physical activities (tennis, racquetball, waterskiing) if you are not in proper physical condition for it. This may aggravate or create problems.  If you have a back problem, avoid sports requiring sudden body movements. Swimming and walking are generally safer activities.  Maintain good posture.  Maintain a healthy weight.  For acute conditions, you may put ice on the injured area.  Put ice in a plastic bag.  Place a towel between your skin and the bag.  Leave the ice on for 20 minutes, 2 3 times a day.  When the low back starts healing, stretching and strengthening exercises may be recommended. SEEK MEDICAL CARE  IF:  Your back pain is getting worse.  You experience severe back pain not relieved with medicines. SEEK IMMEDIATE MEDICAL CARE IF:   You have numbness, tingling, weakness, or problems with the use of your arms or legs.  There is a change in bowel or bladder control.  You have increasing pain in any area of the body, including your belly (abdomen).  You notice shortness of breath, dizziness, or feel faint.  You feel sick to your stomach (nauseous), are throwing up (vomiting), or become sweaty.  You notice discoloration of your toes or legs, or your feet get very cold. MAKE SURE YOU:   Understand these instructions.  Will watch your condition.  Will get help right away if you are not doing well or get worse. Document Released: 02/16/2005 Document Revised: 02/27/2013 Document Reviewed: 12/26/2012 [facility] Patient Information 2014 [place], [place].  Quadriceps Strain with Rehab A strain is a tear in a muscle or the tendon that attaches the muscle to bone. A quadriceps strain is a tear in the muscles on the front of the thigh (quadriceps muscles) or their tendons. The quadriceps muscles  are important for straightening the knee and bending the hip. The condition is characterized by pain, inflammation, and reduced function of these muscles. Strains are classified into three categories. Grade 1 strains cause pain, but the tendon is not lengthened. Grade 2 strains include a lengthened ligament due to the ligament being stretched or partially ruptured. With grade 2 strains there is still function, although the function may be diminished. Grade 3 strains are characterized by a complete tear of the tendon or muscle, and function is usually impaired.  SYMPTOMS   Pain, tenderness, inflammation, and/or bruising (contusion) over the quadriceps muscles  Pain that worsens with use of the quadriceps muscles.  Muscle spasm in the thigh.  Difficulty with common tasks that involve the quadriceps  muscle, such as walking.  A crackling sound (crepitation) when the tendon is moved or touched.  Loss of fullness of the muscle or bulging within the area of muscle with complete rupture. CAUSES  A strain occurs when a force is placed on the muscle or tendon that is greater than it can withstand. Common mechanisms of injury include:  Repetitive strenuous use of the quadriceps muscles. This may be due to an increase in the intensity, frequency, or duration of exercise.  Direct trauma to the quadriceps muscles or tendons. RISK INCREASES WITH:  Activities that involve forceful contractions of the quadriceps muscles (jumping or sprinting).  Contact sports (soccer or football).  Poor strength and flexibility.  Failure to warm-up properly before activity.  Previous injury to the thigh or knee. PREVENTION  Warm up and stretch properly before activity.  Allow for adequate recovery between workouts.  Maintain physical fitness:  Strength, flexibility, and endurance.  Cardiovascular fitness.  Wear properly fitted and padded protective equipment. PROGNOSIS  If treated properly, then quadriceps muscles strains are usually curable within 6 weeks.  RELATED COMPLICATIONS   Prolonged healing time, if improperly treated or re-injured.  Recurrent symptoms that result in a chronic problem.  Recurrence of symptoms if activity is resumed too soon. TREATMENT  Treatment initially involves the use of ice and medication to help reduce pain and inflammation. The use of strengthening and stretching exercises may help reduce pain with activity. These exercises may be performed at home or with referral to a therapist. Crutches may be recommended to allow the muscle to rest until walking can be completed without limping. Surgery is rarely necessary for this injury, but may be considered if the injury involves a grade 3 strain, or if symptoms persist for greater than 3 months despite non-surgical  (conservative) treatment.  MEDICATION  If pain medication is necessary, then nonsteroidal anti-inflammatory medications, such as aspirin and ibuprofen, or other minor pain relievers, such as acetaminophen, are often recommended.  Do not take pain medication for 7 days before surgery.  Prescription pain relievers may be given if deemed necessary by your caregiver. Use only as directed and only as much as you need.  Ointments applied to the skin may be helpful.  Corticosteroid injections may be given by your caregiver. These injections should be reserved for the most serious cases, because they may only be given a certain number of times. HEAT AND COLD  Cold treatment (icing) relieves pain and reduces inflammation. Cold treatment should be applied for 10 to 15 minutes every 2 to 3 hours for inflammation and pain and immediately after any activity that aggravates your symptoms. Use ice packs or massage the area with a piece of ice (ice massage).  Heat treatment may be  used prior to performing the stretching and strengthening activities prescribed by your caregiver, physical therapist, or athletic trainer. Use a heat pack or soak the injury in warm water. SEEK MEDICAL CARE IF:  Treatment seems to offer no benefit, or the condition worsens.  Any medications produce adverse side effects. EXERCISES  RANGE OF MOTION (ROM) AND STRETCHING EXERCISES - Quadriceps Strain These exercises may help you when beginning to rehabilitate your injury. Your symptoms may resolve with or without further involvement from your physician, physical therapist or athletic trainer. While completing these exercises, remember:   Restoring tissue flexibility helps normal motion to return to the joints. This allows healthier, less painful movement and activity.  An effective stretch should be held for at least 30 seconds.  A stretch should never be painful. You should only feel a gentle lengthening or release in the  stretched tissue. RANGE OF MOTION - Knee Flexion, Active  Lie on your back with both knees straight. (If this causes back discomfort, bend your opposite knee, placing your foot flat on the floor.)  Slowly slide your heel back toward your buttocks until you feel a gentle stretch in the front of your knee or thigh.  Hold for __________ seconds. Slowly slide your heel back to the starting position. Repeat __________ times. Complete this exercise __________ times per day.  STRETCH - Quadriceps, Prone  Lie on your stomach on a firm surface, such as a bed or padded floor.  Bend your right / left knee and grasp your ankle. If you are unable to reach, your ankle or pant leg, use a belt around your foot to lengthen your reach.  Gently pull your heel toward your buttocks. Your knee should not slide out to the side. You should feel a stretch in the front of your thigh and/or knee.  Hold this position for __________ seconds. Repeat __________ times. Complete this stretch __________ times per day.  STRETCHING - Hip Flexors, Lunge  Half kneel with your right / left knee on the floor and your opposite knee bent and directly over your ankle.  Keep good posture with your head over your shoulders. Tighten your buttocks to point your tailbone downward; this will prevent your back from arching too much.  You should feel a gentle stretch in the front of your thigh and/or hip. If you do not feel any resistance, slightly slide your opposite foot forward and then slowly lunge forward so your knee once again lines up over your ankle. Be sure your tailbone remains pointed downward.  Hold this stretch for __________ seconds. Repeat __________ times. Complete this stretch __________ times per day. STRENGTHENING EXERCISES - Quadriceps Strain These exercises may help you when beginning to rehabilitate your injury. They may resolve your symptoms with or without further involvement from your physician, physical  therapist or athletic trainer. While completing these exercises, remember:   Muscles can gain both the endurance and the strength needed for everyday activities through controlled exercises.  Complete these exercises as instructed by your physician, physical therapist or athletic trainer. Progress the resistance and repetitions only as guided. STRENGTH - Quadriceps, Isometrics  Lie on your back with your right / left leg extended and your opposite knee bent.  Gradually tense the muscles in the front of your right / left thigh. You should see either your knee cap slide up toward your hip or increased dimpling just above the knee. This motion will push the back of the knee down toward the floor/mat/bed on which  you are lying.  Hold the muscle as tight as you can without increasing your pain for __________ seconds.  Relax the muscles slowly and completely in between each repetition. Repeat __________ times. Complete this exercise __________ times per day.  STRENGTH - Quadriceps, Short Arcs   Lie on your back. Place a __________ inch towel roll under your knee so that the knee slightly bends.  Raise only your lower leg by tightening the muscles in the front of your thigh. Do not allow your thigh to rise.  Hold this position for __________ seconds. Repeat __________ times. Complete this exercise __________ times per day.  OPTIONAL ANKLE WEIGHTS: Begin with ____________________, but DO NOT exceed ____________________. Increase in1 lb/0.5 kg increments. STRENGTH - Quadriceps, Straight Leg Raises  Quality counts! Watch for signs that the quadriceps muscle is working to insure you are strengthening the correct muscles and not "cheating" by substituting with healthier muscles.  Lay on your back with your right / left leg extended and your opposite knee bent.  Tense the muscles in the front of your right / left thigh. You should see either your knee cap slide up or increased dimpling just above the  knee. Your thigh may even quiver.  Tighten these muscles even more and raise your leg 4 to 6 inches off the floor. Hold for __________ seconds.  Keeping these muscles tense, lower your leg.  Relax the muscles slowly and completely in between each repetition. Repeat __________ times. Complete this exercise __________ times per day.  STRENGTH - Quadriceps, Wall Slides  Follow guidelines for form closely. Increased knee pain often results from poorly placed feet or knees.  Lean against a smooth wall or door and walk your feet out 18-24 inches. Place your feet hip-width apart.  Slowly slide down the wall or door until your knees bend __________ degrees.* Keep your knees over your heels, not your toes, and in line with your hips, not falling to either side.  Hold for __________ seconds. Stand up to rest for __________ seconds in between each repetition. Repeat __________ times. Complete this exercise __________ times per day. * Your physician, physical therapist or athletic trainer will alter this angle based on your symptoms and progress. STRENGTH - Quadriceps, Step-Ups   Use a thick book, step or step stool that is __________ inches tall.  Holding a wall or counter for balance only, not support.  Slowly step-up with your right / left foot, keeping your knee in line with your hip and foot. Do not allow your knee to bend so far that you cannot see your toes.  Slowly unlock your knee and lower yourself to the starting position. Your muscles, not gravity, should lower you. Repeat __________ times. Complete this exercise __________ times per day. Document Released: 05/09/2005 Document Revised: 08/01/2011 Document Reviewed: 08/21/2008 [facility] Patient Information 2014 [place], [place].

## 2013-10-18 NOTE — ED Notes (Signed)
Patient came to get note for Guard, as she is continuing to have problems w her back

## 2014-03-24 ENCOUNTER — Encounter (HOSPITAL_COMMUNITY): Payer: Self-pay | Admitting: Emergency Medicine

## 2014-05-03 ENCOUNTER — Emergency Department (HOSPITAL_COMMUNITY): Payer: Self-pay

## 2014-05-03 ENCOUNTER — Encounter (HOSPITAL_COMMUNITY): Payer: Self-pay | Admitting: *Deleted

## 2014-05-03 ENCOUNTER — Emergency Department (HOSPITAL_COMMUNITY)
Admission: EM | Admit: 2014-05-03 | Discharge: 2014-05-03 | Disposition: A | Discharge status: Home / Self Care | Payer: Self-pay | Attending: Emergency Medicine | Admitting: Emergency Medicine

## 2014-05-03 DIAGNOSIS — Z872 Personal history of diseases of the skin and subcutaneous tissue: Secondary | ICD-10-CM | POA: Insufficient documentation

## 2014-05-03 DIAGNOSIS — R112 Nausea with vomiting, unspecified: Secondary | ICD-10-CM

## 2014-05-03 DIAGNOSIS — R220 Localized swelling, mass and lump, head: Secondary | ICD-10-CM | POA: Insufficient documentation

## 2014-05-03 DIAGNOSIS — Z87448 Personal history of other diseases of urinary system: Secondary | ICD-10-CM | POA: Insufficient documentation

## 2014-05-03 DIAGNOSIS — B349 Viral infection, unspecified: Secondary | ICD-10-CM | POA: Insufficient documentation

## 2014-05-03 DIAGNOSIS — Z792 Long term (current) use of antibiotics: Secondary | ICD-10-CM | POA: Insufficient documentation

## 2014-05-03 DIAGNOSIS — Z87891 Personal history of nicotine dependence: Secondary | ICD-10-CM | POA: Insufficient documentation

## 2014-05-03 DIAGNOSIS — R059 Cough, unspecified: Secondary | ICD-10-CM

## 2014-05-03 DIAGNOSIS — Z862 Personal history of diseases of the blood and blood-forming organs and certain disorders involving the immune mechanism: Secondary | ICD-10-CM | POA: Insufficient documentation

## 2014-05-03 DIAGNOSIS — Z8639 Personal history of other endocrine, nutritional and metabolic disease: Secondary | ICD-10-CM | POA: Insufficient documentation

## 2014-05-03 DIAGNOSIS — Z8742 Personal history of other diseases of the female genital tract: Secondary | ICD-10-CM | POA: Insufficient documentation

## 2014-05-03 DIAGNOSIS — R05 Cough: Secondary | ICD-10-CM

## 2014-05-03 DIAGNOSIS — R0789 Other chest pain: Secondary | ICD-10-CM | POA: Insufficient documentation

## 2014-05-03 DIAGNOSIS — Z79899 Other long term (current) drug therapy: Secondary | ICD-10-CM | POA: Insufficient documentation

## 2014-05-03 LAB — CBC WITH DIFFERENTIAL/PLATELET
Basophils Absolute: 0 10*3/uL (ref 0.0–0.1)
Basophils Relative: 0 % (ref 0–1)
EOS ABS: 0.1 10*3/uL (ref 0.0–0.7)
Eosinophils Relative: 1 % (ref 0–5)
HCT: 38 % (ref 36.0–46.0)
HEMOGLOBIN: 13.1 g/dL (ref 12.0–15.0)
LYMPHS ABS: 2.5 10*3/uL (ref 0.7–4.0)
Lymphocytes Relative: 30 % (ref 12–46)
MCH: 29.8 pg (ref 26.0–34.0)
MCHC: 34.5 g/dL (ref 30.0–36.0)
MCV: 86.6 fL (ref 78.0–100.0)
MONOS PCT: 6 % (ref 3–12)
Monocytes Absolute: 0.5 10*3/uL (ref 0.1–1.0)
Neutro Abs: 5.1 10*3/uL (ref 1.7–7.7)
Neutrophils Relative %: 63 % (ref 43–77)
Platelets: 230 10*3/uL (ref 150–400)
RBC: 4.39 MIL/uL (ref 3.87–5.11)
RDW: 13.3 % (ref 11.5–15.5)
WBC: 8.3 10*3/uL (ref 4.0–10.5)

## 2014-05-03 LAB — COMPREHENSIVE METABOLIC PANEL
ALK PHOS: 59 U/L (ref 39–117)
ALT: 11 U/L (ref 0–35)
ANION GAP: 14 (ref 5–15)
AST: 15 U/L (ref 0–37)
Albumin: 4.1 g/dL (ref 3.5–5.2)
BILIRUBIN TOTAL: 0.7 mg/dL (ref 0.3–1.2)
BUN: 8 mg/dL (ref 6–23)
CO2: 20 mEq/L (ref 19–32)
Calcium: 9.7 mg/dL (ref 8.4–10.5)
Chloride: 105 mEq/L (ref 96–112)
Creatinine, Ser: 0.8 mg/dL (ref 0.50–1.10)
GLUCOSE: 102 mg/dL — AB (ref 70–99)
POTASSIUM: 4.1 meq/L (ref 3.7–5.3)
Sodium: 139 mEq/L (ref 137–147)
Total Protein: 7.5 g/dL (ref 6.0–8.3)

## 2014-05-03 LAB — URINE MICROSCOPIC-ADD ON

## 2014-05-03 LAB — URINALYSIS, ROUTINE W REFLEX MICROSCOPIC
BILIRUBIN URINE: NEGATIVE
Glucose, UA: NEGATIVE mg/dL
HGB URINE DIPSTICK: NEGATIVE
KETONES UR: NEGATIVE mg/dL
Nitrite: NEGATIVE
PROTEIN: NEGATIVE mg/dL
Specific Gravity, Urine: 1.017 (ref 1.005–1.030)
UROBILINOGEN UA: 0.2 mg/dL (ref 0.0–1.0)
pH: 7.5 (ref 5.0–8.0)

## 2014-05-03 LAB — LIPASE, BLOOD: Lipase: 18 U/L (ref 11–59)

## 2014-05-03 LAB — I-STAT TROPONIN, ED: Troponin i, poc: 0 ng/mL (ref 0.00–0.08)

## 2014-05-03 MED ORDER — GUAIFENESIN 100 MG/5ML PO LIQD: 100.0000 mg | ORAL | Status: DC | PRN

## 2014-05-03 MED ORDER — AZITHROMYCIN 250 MG PO TABS: 250.0000 mg | ORAL_TABLET | Freq: Every day | ORAL | Status: DC

## 2014-05-03 MED ORDER — ONDANSETRON HCL 8 MG PO TABS: 8.0000 mg | ORAL_TABLET | Freq: Three times a day (TID) | ORAL | Status: DC | PRN

## 2014-05-03 MED ORDER — ALBUTEROL SULFATE HFA 108 (90 BASE) MCG/ACT IN AERS: 1.0000 | INHALATION_SPRAY | Freq: Four times a day (QID) | RESPIRATORY_TRACT | Status: DC | PRN

## 2014-05-03 MED ORDER — SODIUM CHLORIDE 0.9 % IV BOLUS (SEPSIS)
1000.0000 mL | Freq: Once | INTRAVENOUS | Status: AC
  Administered 2014-05-03: 1000 mL via INTRAVENOUS

## 2014-05-03 NOTE — Discharge Instructions (Signed)
Viral Infections A viral infection can be caused by different types of viruses.Most viral infections are not serious and resolve on their own. However, some infections may cause severe symptoms and may lead to further complications. SYMPTOMS Viruses can frequently cause:  Minor sore throat.  Aches and pains.  Headaches.  Runny nose.  Different types of rashes.  Watery eyes.  Tiredness.  Cough.  Loss of appetite.  Gastrointestinal infections, resulting in nausea, vomiting, and diarrhea. These symptoms do not respond to antibiotics because the infection is not caused by bacteria. However, you might catch a bacterial infection following the viral infection. This is sometimes called a "superinfection." Symptoms of such a bacterial infection may include:  Worsening sore throat with pus and difficulty swallowing.  Swollen neck glands.  Chills and a high or persistent fever.  Severe headache.  Tenderness over the sinuses.  Persistent overall ill feeling (malaise), muscle aches, and tiredness (fatigue).  Persistent cough.  Yellow, green, or brown mucus production with coughing. HOME CARE INSTRUCTIONS   Only take over-the-counter or prescription medicines for pain, discomfort, diarrhea, or fever as directed by your caregiver.  Drink enough water and fluids to keep your urine clear or pale yellow. Sports drinks can provide valuable electrolytes, sugars, and hydration.  Get plenty of rest and maintain proper nutrition. Soups and broths with crackers or rice are fine. SEEK IMMEDIATE MEDICAL CARE IF:   You have severe headaches, shortness of breath, chest pain, neck pain, or an unusual rash.  You have uncontrolled vomiting, diarrhea, or you are unable to keep down fluids.  You or your child has an oral temperature above 102 F (38.9 C), not controlled by medicine.  Your baby is older than 3 months with a rectal temperature of 102 F (38.9 C) or higher.  Your baby is 46  months old or younger with a rectal temperature of 100.4 F (38 C) or higher. MAKE SURE YOU:   Understand these instructions.  Will watch your condition.  Will get help right away if you are not doing well or get worse. Document Released: 02/16/2005 Document Revised: 08/01/2011 Document Reviewed: 09/13/2010 [facility] Patient Information 2015 [place], [place]. This information is not intended to replace advice given to you by your health care provider. Make sure you discuss any questions you have with your health care provider.   Emergency Department Resource Guide 1) Find a Doctor and Pay Out of Pocket Although you won't have to find out who is covered by your insurance plan, it is a good idea to ask around and get recommendations. You will then need to call the office and see if the doctor you have chosen will accept you as a new patient and what types of options they offer for patients who are self-pay. Some doctors offer discounts or will set up payment plans for their patients who do not have insurance, but you will need to ask so you aren't surprised when you get to your appointment.  2) Contact Your Local Health Department Not all health departments have doctors that can see patients for sick visits, but many do, so it is worth a call to see if yours does. If you don't know where your local health department is, you can check in your phone book. The CDC also has a tool to help you locate your state's health department, and many state websites also have listings of all of their local health departments.  3) Find a [facility] If your illness is not likely  to be very severe or complicated, you may want to try a walk in clinic. These are popping up all over the country in pharmacies, drugstores, and shopping centers. They're usually staffed by nurse practitioners or physician assistants that have been trained to treat common illnesses and complaints. They're usually fairly quick and  inexpensive. However, if you have serious medical issues or chronic medical problems, these are probably not your best option.  No Primary Care Doctor: - Call Health Connect at  [phone] - they can help you locate a primary care doctor that  accepts your insurance, provides certain services, etc. - Physician Referral Service- [phone]  Chronic Pain Problems: Organization         Address  Phone   Notes  [facility]  [phone] Patients need to be referred by their primary care doctor.   Medication Assistance: Organization         Address  Phone   Notes  [facility] Medication [facility] [address] [place], [place] [address] [phone] --Must be a resident of [facility] -- Must have NO insurance coverage whatsoever (no Medicaid/ Medicare, etc.) -- The pt. MUST have a primary care doctor that directs their care regularly and follows them in the community   MedAssist  [phone]   [employer]  [phone]    Agencies that provide inexpensive medical care: Organization         Address  Phone   Notes  [place]  [phone]   [facility]    [phone]   [facility] [place], [place] [address] [phone]   [place] [address], [place] [phone]   Planned Parenthood    [phone]   [facility]    [phone]   [place] and [address], [place] Phone:  [phone], Fax:  [phone] Hours of Operation:  9 am - 6 pm, M-F.  Also accepts Medicaid/Medicare and self-pay.  [facility] for [place] [address], [address], [place] Phone: [phone], Fax: [phone]. Hours of Operation:  8:30 am - 5:30 pm, M-F.  Also accepts Medicaid and self-pay.  [facility] [place] [address] [place] Phone: [phone]   [place], [place], [place] [phone], Ext. [phone] Mondays & Thursdays: 7-9 AM.  First 15 patients are seen on a first come, first serve basis.    [place] Providers:  Organization         Address  Phone   Notes  [facility] [address], [place] [phone] Also accepts self-pay patients.  [facility] [address], [place]  [phone]   [place], [address], [place] [phone]   [facility] [address] [place] [phone]   Lucianne Lei [address], [place]   [phone] Only accepts [place] Access [place] patients after they have their name applied to their card.   Self-Pay (no insurance) in [facility]:  Organization         Address  Phone   Notes  Sickle Cell Patients, [facility] [place] [phone]   [facility]  Urgent Care [place] [phone]   [facility] [place]  [address] [address], [place], [place] [phone]   [facility]/Dr. Osei-Bonsu  [address] [place] or [address], [place] [phone] Phone number for both [place] and [place] locations is the same.  Urgent Medical and [facility] [address] [place] [phone]   [facility] [address], [place] or [address] [phone]   [facility] [address] [place] [phone], phone; [phone], fax Sees patients 1st and 3rd Saturday of every month.  Must not qualify for public or private insurance (i.e. Medicaid, Medicare, [place] Health Choice, Veterans' Benefits)  Household income should be no more than 200% of the poverty level The clinic cannot treat you if you are pregnant or  think you are pregnant  Sexually transmitted diseases are not treated at the clinic.    Dental Care: Organization         Address  Phone  Notes  [facility] Department of [facility] [place] [phone] Accepts children up to age 25 who are enrolled in [place] or [place]; pregnant women with a Medicaid card; and children who have applied for Medicaid or [place] Health Choice, but were declined, whose parents can pay a reduced fee at time of service.  [facility] Department of [facility]  [address], [place] [phone] Accepts children up to age 31 who are enrolled in [place] or [place]; pregnant women with a Medicaid card; and children who have applied for Medicaid or [place] Health Choice, but were declined, whose parents can pay a reduced fee at time of service.  [place] Adult Dental Access PROGRAM  [place] [phone] Patients are seen by appointment only. Walk-ins are not accepted. [place] will see patients 9 years of age and older. Monday - Tuesday (8am-5pm) Most Wednesdays (8:30-5pm) $30 per visit, cash only  [facility] Adult Dental Access PROGRAM  [address], [facility] [phone] Patients are seen by appointment only. Walk-ins are not accepted. [place] will see patients 33 years of age and older. One Wednesday Evening (Monthly: Volunteer Based).  $30 per visit, cash only  [place]  [phone] for adults; Children under age 3, call Graduate Pediatric Dentistry at [phone]. Children aged 58-14, please call [phone] to request a pediatric application.  Dental services are provided in all areas of dental care including fillings, crowns and bridges, complete and partial dentures, implants, gum treatment, root canals, and extractions. Preventive care is also provided. Treatment is provided to both adults  and children. Patients are selected via a lottery and there is often a waiting list.   [facility] [address] [place]  [phone] [email or web address]   [facility] [address] [place], [place] [phone], Ext. [phone] Second and Fourth Thursday of each month, opens at 6:30 AM; Clinic ends at 9 AM.  Patients are seen on a first-come first-served basis, and a limited number are seen during each clinic.   [facility]  [address] Hillard Danker [place], [place] [phone]   Eligibility Requirements You must have lived in [place], [place], or [place] counties for at least the last three months.  You cannot be eligible for state or federal sponsored [employer], including [employer], [place], or [employer].   You generally cannot be eligible for healthcare insurance through your employer.    How to apply: Eligibility screenings are held every Tuesday and Wednesday afternoon from 1:00 pm until 4:00 pm. You do not need an appointment for the interview!  [facility] [address], [place], [place]   [place]  [place] Department  [place]  [phone]    Behavioral Health Resources in the Community: Intensive Outpatient Programs Organization         Address  Phone  Notes  [place]. [address] [place], [place] [phone]   [facility] Outpatient [address] [place], [place]   ADS: Alcohol & Drug Svcs [address] [place], [place]   [place] [address]  [place], [place] or [phone]   Substance Abuse Resources Organization         Address  Phone  Notes  Alcohol and Drug Services  [phone]   [place]  [phone]   The [place]     Chinita Pester  [phone]   Residential & Outpatient Substance Abuse Program  [phone]   Psychological Services Organization         Address  Phone  Notes  [facility] [place]  [place]  [phone]   [place] [address], [place] or [phone]    Mobile Crisis Teams Organization         Address  Phone  Notes  [facility], Mobile Crisis Care Unit  [phone]   Assertive Psychotherapeutic Services  [address] [place], [place]   [place] Levels [address] [place] [phone]    Self-Help/Support Groups Organization         Address  Phone             Notes  [place]. of [place] - variety of support groups  [place] Call for more information  Narcotics Anonymous (NA), [facility] [address], [employer] [place]  2 meetings at this location   Special educational needs teacher         Address  Phone  Notes  ASAP Residential Treatment [place],    [place]  [phone]   [facility]  [address], [place] [phone], [place], [place]   [place], [place] [phone] Admissions: 8am-3pm M-F  Incentives Substance [place] [address]    [place], [place] [phone]   [facility] [address] [place], [place], [place]   [facility] [address],  [place], [place]   [facility]ight Programs - Intensive Outpatient [address], [place], [place]   [facility] ([place] [place].,  [place], [place] [phone] or [phone]   [facility]idential Treatment Services (RTS) [address], [place], [place] Accepts Medicaid  [facility] [place] [address]  [place] [phone] Substance Abuse/Addiction Treatment   [place] Organization         Address  Phone  Notes  [facility]  [phone]   Domenic Schwab, PhD [phone]  [address], [place], [place]   [phone] or [phone]   [facility]   [address] [place], [place] [phone]   [address], [place], [place] [phone] Insurance/Medicaid/sponsorship through [facility] and Families [address]                                    [place], [place] [phone] [place] [address], [place] [phone]    Dr. Adele Schilder  [phone]   [facility] Dept. 1) [address], [place] 2) [place] 3)  [place], [place] [phone]  [phone]   [place] [phone] or [phone] (After Hours)

## 2014-05-03 NOTE — ED Provider Notes (Signed)
33 year old female presents with approximate 4 days of cough with associated dyspnea on exertion, phlegm, shortness of breath and some pleuritic chest pain. She has had some difficulty tolerating oral fluids and food, she has tried over-the-counter medications with minimal relief. On exam the patient appears to be mildly tachypneic, she has a hoarse voice but overall has clear lung sounds, mild tachypnea, no peripheral edema or asymmetry, no hypoxia and tachycardia or hypotension. Imaging to rule out pneumonia or pneumothorax that most likely this sounds like a viral syndrome with her upper respiratory symptoms as well.  Medical screening examination/treatment/procedure(s) were conducted as a shared visit with non-physician practitioner(s) and myself.  I personally evaluated the patient during the encounter.  Clinical Impression:   Final diagnoses:  Cough  Viral syndrome  Non-intractable vomiting with nausea, vomiting of unspecified type         Johnna Acosta, MD 05/03/14 [phone]

## 2014-05-03 NOTE — ED Notes (Signed)
Pt reports 4 days of cough with small amount of phlegm, shob, pleuritic chest pain. Sts she "can't keep anything down, not even water." OTC Mucinex with mild relief.

## 2014-05-03 NOTE — ED Provider Notes (Signed)
CSN: [id number]     Arrival date & time 05/03/14  4287 History   First MD Initiated Contact with Patient 05/03/14 0751     Chief Complaint  Patient presents with  . Cough  . Shortness of Breath   HPI  Patient is a 33 year old female who presents emergency room for evaluation of cough, chest pain, shortness of breath, and nausea and vomiting. Patient states that this is been going on for 4 days. She has 3 kids at home with the same symptoms. She states that she has been having a barking cough with some minimal sputum production that is slightly yellow. She states that she is having chest pain with coughing. She states that she has pressure in her chest with inspiration. She feels all that short of breath and she is up and walking around or when she is speaking in. Long sentences. She has been trying Mucinex at home with little relief. She also states that she's having nausea and vomiting. She states that she is vomiting 3-4 times per day. She cannot keep down any food that she eats. She is able to keep down minimal amounts of liquid. She has no blood in her vomit. She also complains of some looser stools than normal but are green in color. She states that her urine has been slightly decreased. She has not noticed any fevers at home.    Past Medical History  Diagnosis Date  . Pyelonephritis     x 3 episodes  . Abnormal Pap smear of cervix     LEEP 2011  . Hypoglycemia   . Anemia     with pregnancy  . Chronic headaches   . Cyst near coccyx   . Pilonidal cyst   . Sickle cell trait    Past Surgical History  Procedure Laterality Date  . Dilation and curettage of uterus  2008  . Leep  2011  . Other surgical history      Pynlodial cyst removed from tail bone 2012  . Other surgical history  10/2010    removal of cyst near [facility]  . Pilonidal cyst excision  10/2010   Family History  Problem Relation Age of Onset  . Hypertension Brother   . Cancer Maternal Aunt     breast   History   Substance Use Topics  . Smoking status: Former Smoker -- 0.10 packs/day    Types: Cigarettes    Quit date: 07/14/2012  . Smokeless tobacco: Never Used  . Alcohol Use: No   OB History    Gravida Para Term Preterm AB TAB SAB Ectopic Multiple Living   5 2 2  0 3 1 2  0 0 2     Review of Systems  Constitutional: Positive for chills and fatigue. Negative for fever.  Respiratory: Positive for cough, chest tightness and shortness of breath. Negative for wheezing.   Cardiovascular: Positive for chest pain. Negative for palpitations and leg swelling.  Gastrointestinal: Positive for nausea and vomiting. Negative for abdominal pain, diarrhea, constipation and blood in stool.  Genitourinary: Positive for decreased urine volume. Negative for dysuria, urgency, frequency, hematuria, difficulty urinating and pelvic pain.  Musculoskeletal: Positive for myalgias. Negative for arthralgias.  Skin: Negative for rash.  Neurological: Positive for weakness. Negative for dizziness, numbness and headaches.  All other systems reviewed and are negative.     Allergies  Codeine and Septra  Home Medications   Prior to Admission medications   Medication Sig Start Date End Date Taking? Authorizing Provider  acetaminophen (TYLENOL) 500 MG tablet Take 1,000 mg by mouth every 6 (six) hours as needed for pain.   Yes Historical Provider, MD  albuterol (PROVENTIL HFA;VENTOLIN HFA) 108 (90 BASE) MCG/ACT inhaler Inhale 1-2 puffs into the lungs every 6 (six) hours as needed for wheezing or shortness of breath. 05/03/14   Augustine Brannick A Forcucci, PA-C  azithromycin (ZITHROMAX) 250 MG tablet Take 1 tablet (250 mg total) by mouth daily. Take first 2 tablets together, then 1 every day until finished. 05/03/14   Rheya Minogue A Forcucci, PA-C  diclofenac sodium (VOLTAREN) 1 % GEL Apply 1 application topically 4 (four) times daily. Patient not taking: Reported on 05/03/2014 10/14/13   Janne Napoleon, NP  guaiFENesin (ROBITUSSIN) 100 MG/5ML  liquid Take 5-10 mLs (100-200 mg total) by mouth every 4 (four) hours as needed for cough. 05/03/14   Maddalena Linarez A Forcucci, PA-C  ibuprofen (ADVIL,MOTRIN) 600 MG tablet Take 1 tablet (600 mg total) by mouth every 6 (six) hours. Patient not taking: Reported on 05/03/2014 02/26/13   Crawford Givens, MD  ondansetron (ZOFRAN) 8 MG tablet Take 1 tablet (8 mg total) by mouth every 8 (eight) hours as needed for nausea. 05/03/14   Kolbee Stallman A Forcucci, PA-C   BP 121/66 mmHg  Pulse 89  Temp(Src) 97.8 F (36.6 C) (Oral)  Resp 18  SpO2 100%  LMP 04/15/2014 (Exact Date) Physical Exam  Constitutional: She is oriented to person, place, and time. She appears well-developed and well-nourished. No distress.  HENT:  Head: Normocephalic and atraumatic.  Mouth/Throat: Oropharynx is clear and moist. No oropharyngeal exudate.  Mucosal edema in the nose  Eyes: Conjunctivae and EOM are normal. Pupils are equal, round, and reactive to light. No scleral icterus.  Neck: Normal range of motion. Neck supple. No JVD present. No thyromegaly present.  Cardiovascular: Normal rate, regular rhythm, normal heart sounds and intact distal pulses.  Exam reveals no gallop and no friction rub.   No murmur heard. Pulmonary/Chest: Effort normal and breath sounds normal. No respiratory distress. She has no wheezes. She has no rales. She exhibits no tenderness.  Abdominal: Soft. Bowel sounds are normal. She exhibits no distension and no mass. There is no tenderness. There is no rebound and no guarding.  Musculoskeletal: Normal range of motion.  Lymphadenopathy:    She has no cervical adenopathy.  Neurological: She is alert and oriented to person, place, and time.  Skin: Skin is warm and dry. She is not diaphoretic.  Psychiatric: She has a normal mood and affect. Her behavior is normal. Judgment and thought content normal.  Nursing note and vitals reviewed.   ED Course  Procedures (including critical care time) Labs Review Labs  Reviewed  COMPREHENSIVE METABOLIC PANEL - Abnormal; Notable for the following:    Glucose, Bld 102 (*)    All other components within normal limits  URINALYSIS, ROUTINE W REFLEX MICROSCOPIC - Abnormal; Notable for the following:    APPearance CLOUDY (*)    Leukocytes, UA SMALL (*)    All other components within normal limits  URINE MICROSCOPIC-ADD ON - Abnormal; Notable for the following:    Squamous Epithelial / LPF MANY (*)    Bacteria, UA FEW (*)    All other components within normal limits  CBC WITH DIFFERENTIAL  LIPASE, BLOOD  I-STAT TROPOININ, ED    Imaging Review Dg Chest 2 View  05/03/2014   CLINICAL DATA:  33 year old female with 4 day history of cough and chest pain  EXAM: CHEST  2 VIEW  COMPARISON:  None.  FINDINGS: Perhaps trace focal opacity in the medial right lung base seen only on the frontal view. Otherwise, the lungs are clear. The cardiac and mediastinal contours are within normal limits. Unremarkable osseous structures. No pleural effusion or pneumothorax.  IMPRESSION: Perhaps trace focal opacity in the medial right base seen only on the frontal view. This could represent very early infiltrate/ pneumonia.   Electronically Signed   By: Jacqulynn Cadet M.D.   On: 05/03/2014 10:15     EKG Interpretation   Date/Time:  Saturday May 03 2014 09:17:49 EST Ventricular Rate:  67 PR Interval:  193 QRS Duration: 72 QT Interval:  389 QTC Calculation: 411 R Axis:   70 Text Interpretation:  Sinus rhythm Borderline T abnormalities, anterior  leads No previous tracing Confirmed by KNAPP  MD-J, JON ([id number]) on  05/03/2014 9:20:26 AM      MDM   Final diagnoses:  Cough  Viral syndrome  Non-intractable vomiting with nausea, vomiting of unspecified type   Patient is a 33 year old female who presents to the emergency room for evaluation of cough and shortness of breath. Physical exam reveals an alert nontoxic-appearing female with an active cough. Lungs sound clear to  auscultation. Vital signs are stable. Patient treated here with 1 L of normal saline. CBC, CMP, lipase, and i-STAT troponin are unremarkable. UA looks to be contaminated. Patient denies any urinary symptoms at this time. Doubt UTI. Chest x-ray reveals possible opacity in the right medial lung base which may be early infiltrate versus pneumonia. EKG reveals some borderline T-wave abnormalities. Patient has a heart score of 1. Patient is perk negative. Doubt PE. We'll discharge the patient home with azithromycin, albuterol, Robitussin, zofran and have suggested using nasal saline in the nose. I have recommended the patient continue Mucinex if she feels that it is helping. Patient to return for intractable fever, worsening shortness breath, or productive cough. She states understanding and agreement at this time. Patient was seen by and examined by Dr. Sabra Heck who agrees with the above workup and plan. Patient stable for discharge at this time. I will give the patient a referral to the [facility].    Cherylann Parr, PA-C 05/03/14 1133  Johnna Acosta, MD 05/03/14 [phone]

## 2014-06-05 ENCOUNTER — Encounter (HOSPITAL_COMMUNITY): Payer: Self-pay | Admitting: Obstetrics and Gynecology

## 2014-06-30 IMAGING — CR DG FOOT COMPLETE 3+V*R*
3 series · 3 of 3 positions shown · non-contrast
Comparison: None.

CLINICAL DATA: Blunt trauma right foot

RIGHT FOOT COMPLETE - 3+ VIEW

[x foot ap right]
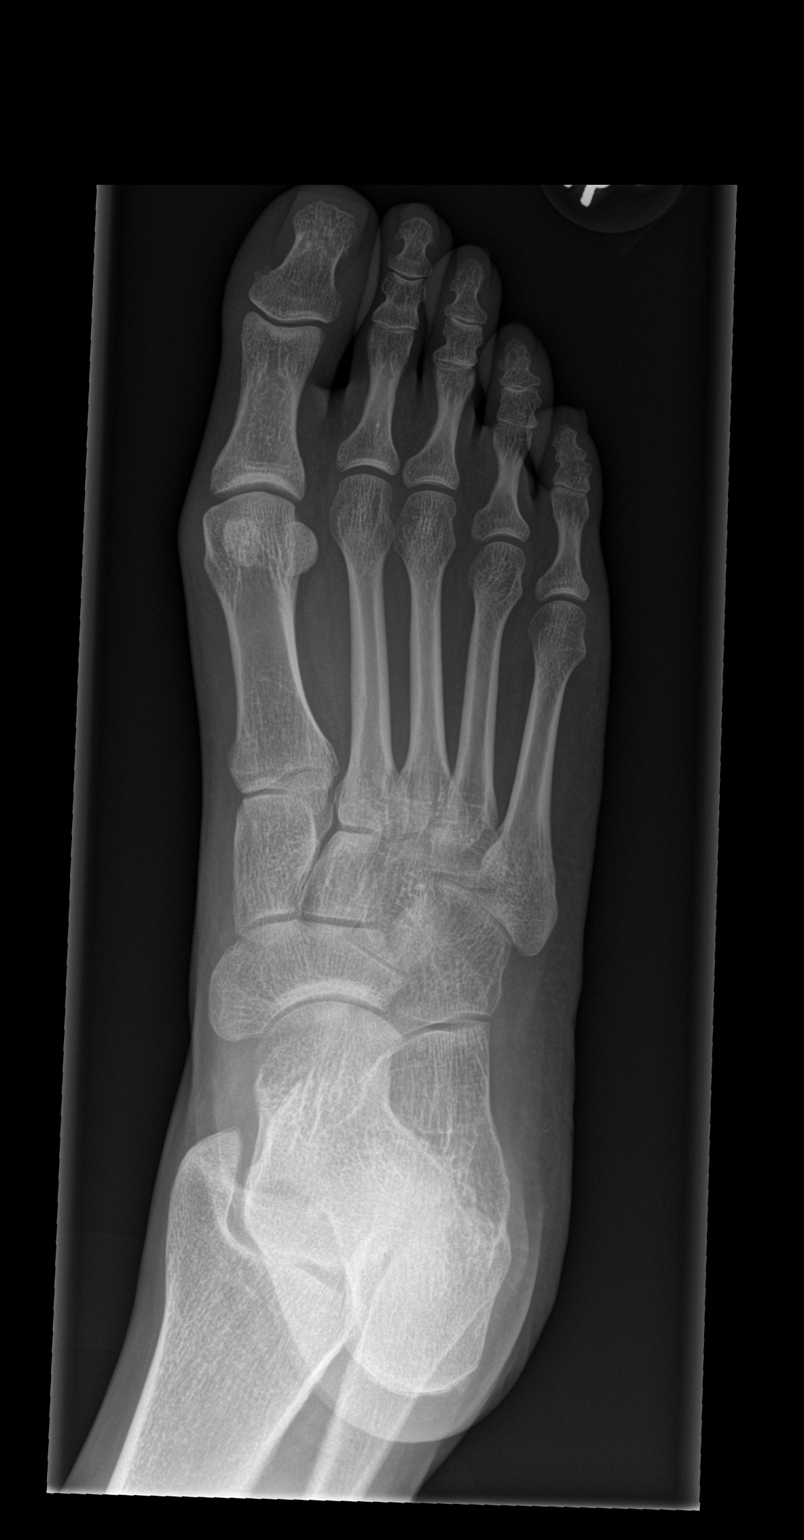

[x foot obl right]
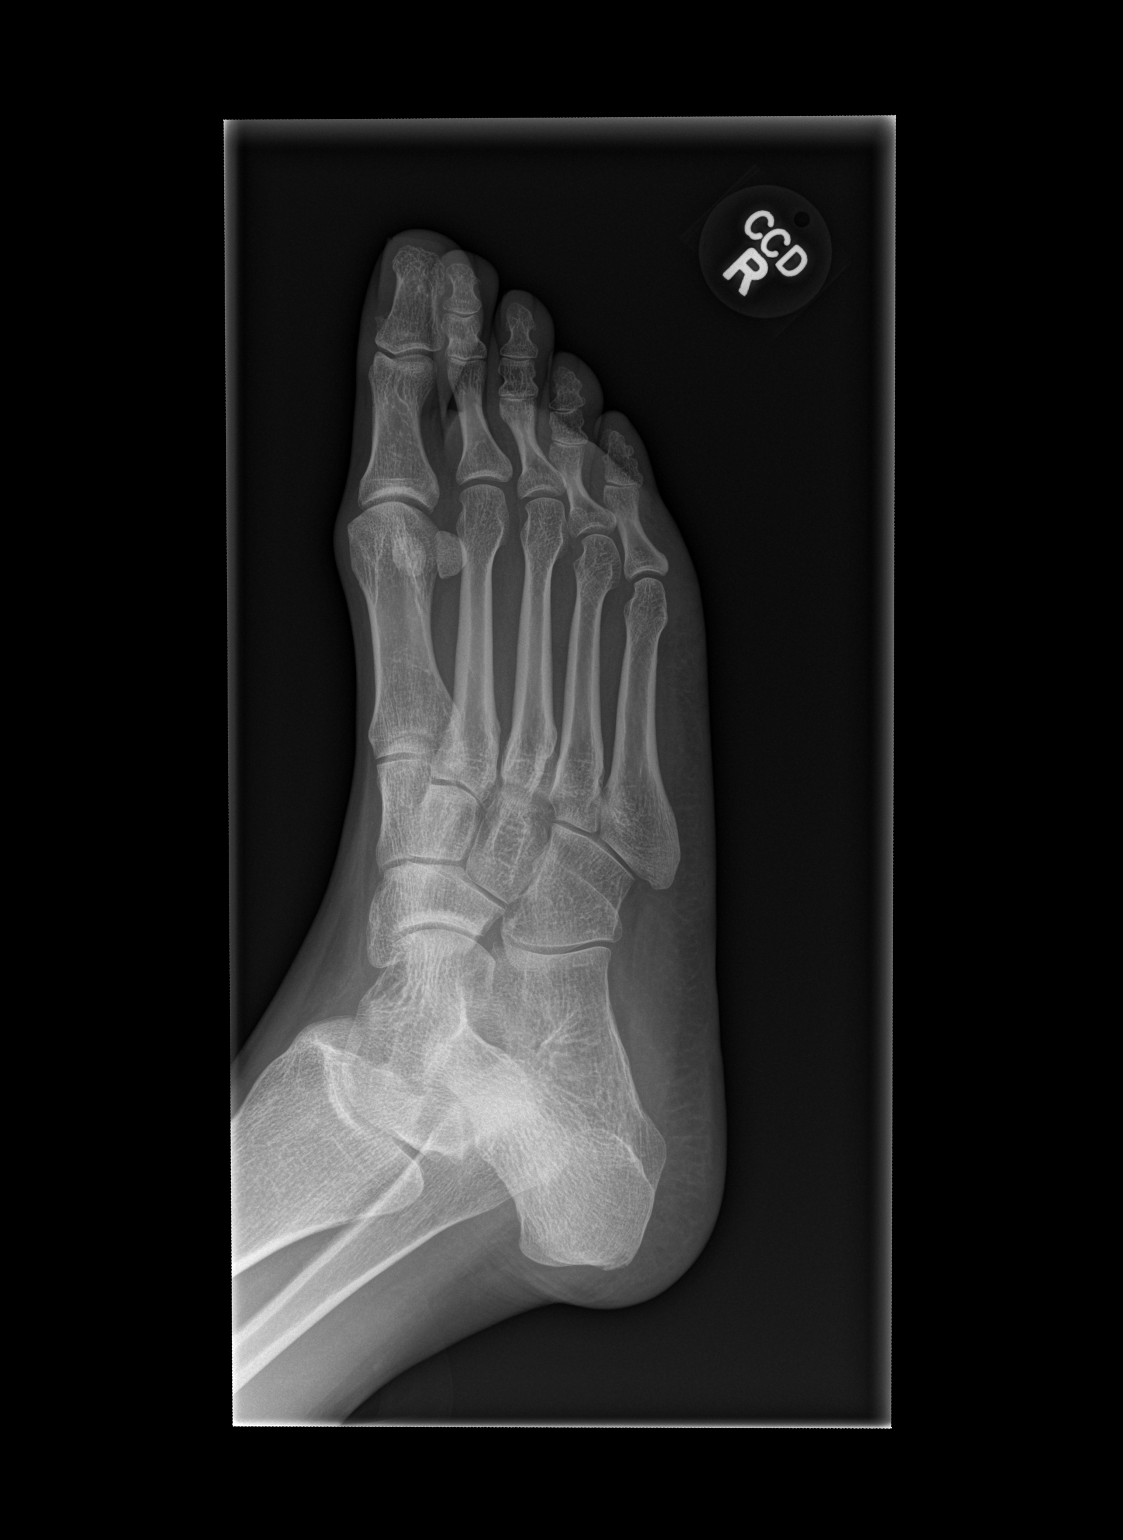

[x foot lat right]
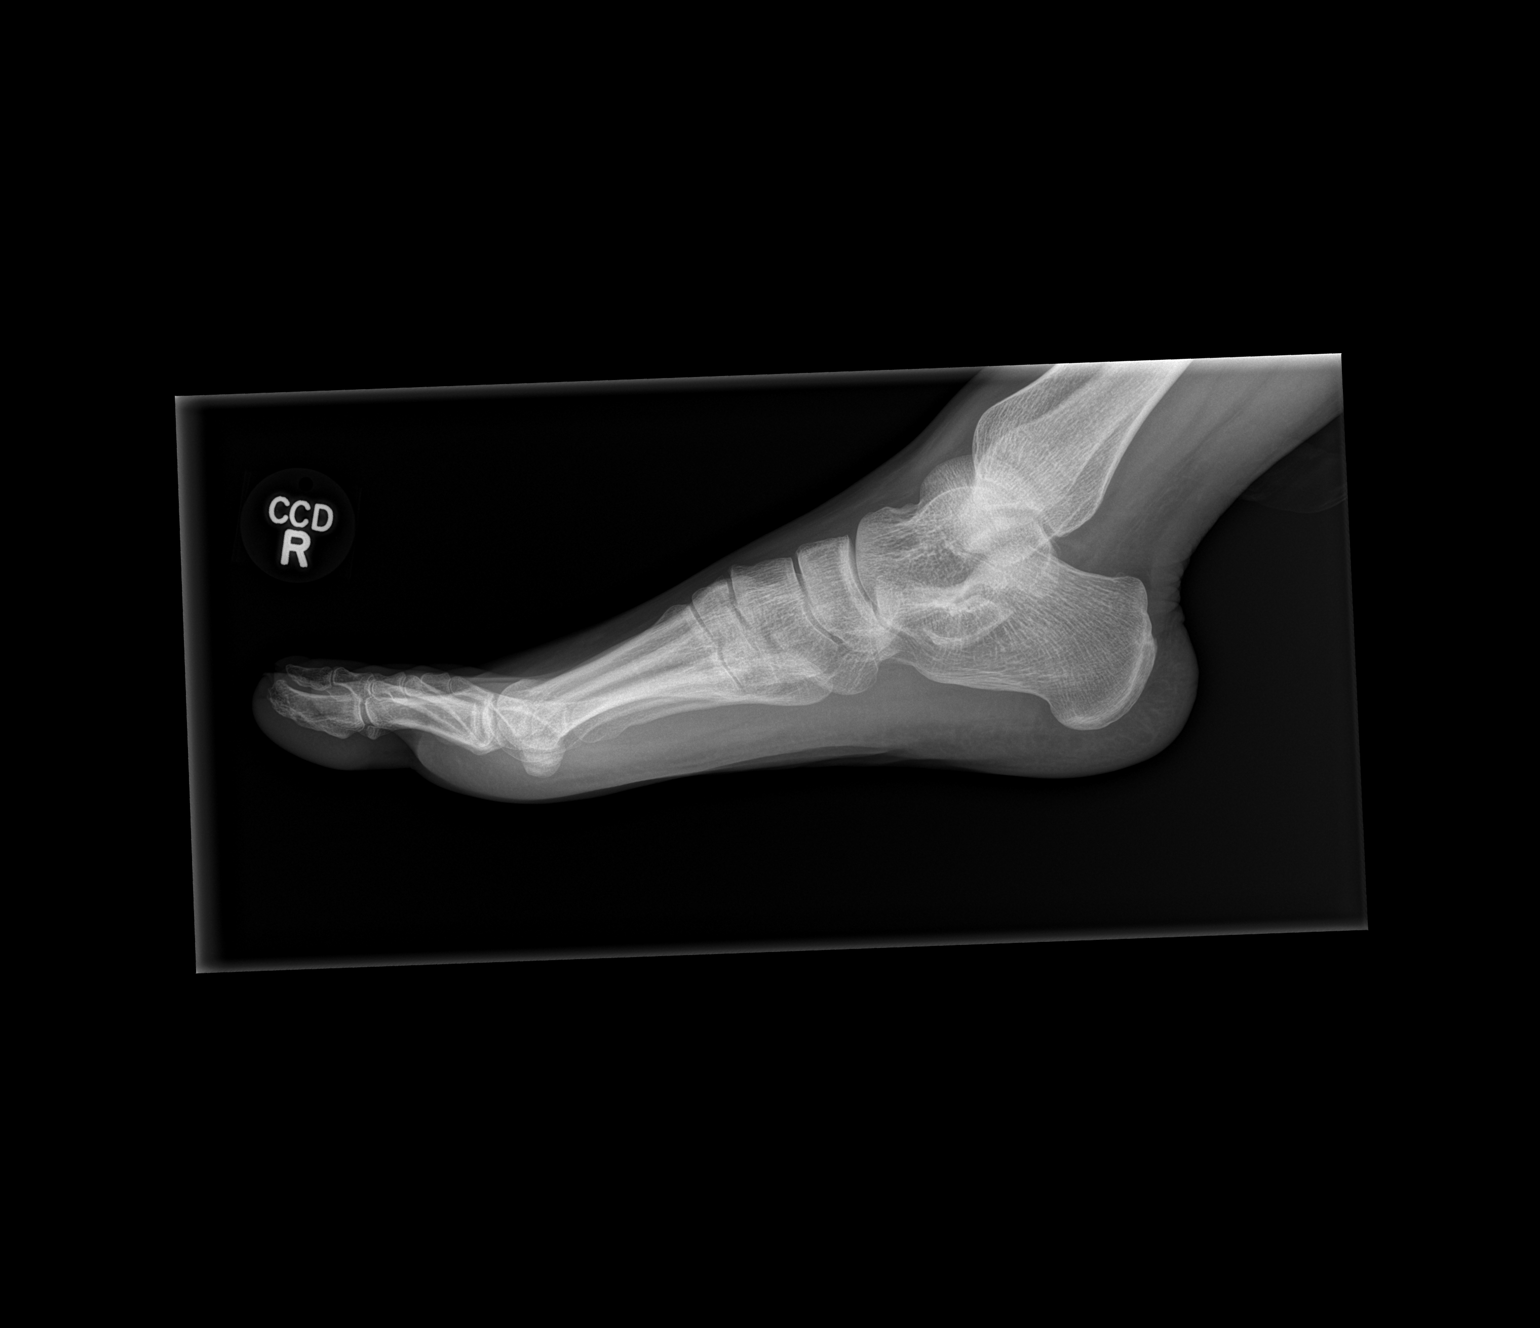

[3 of 3 positions shown; findings below may reference images not displayed]

FINDINGS: There is no fracture of the midfoot or forefoot.  The
phalanges are normal.  No dislocation.  Calcaneus normal.
IMPRESSION: No foot fracture or dislocation.

## 2014-07-12 ENCOUNTER — Emergency Department (HOSPITAL_COMMUNITY)
Admission: EM | Admit: 2014-07-12 | Discharge: 2014-07-12 | Disposition: A | Discharge status: Home / Self Care | Payer: Self-pay | Attending: Emergency Medicine | Admitting: Emergency Medicine

## 2014-07-12 ENCOUNTER — Encounter (HOSPITAL_COMMUNITY): Payer: Self-pay | Admitting: *Deleted

## 2014-07-12 DIAGNOSIS — Z791 Long term (current) use of non-steroidal anti-inflammatories (NSAID): Secondary | ICD-10-CM | POA: Insufficient documentation

## 2014-07-12 DIAGNOSIS — Z87891 Personal history of nicotine dependence: Secondary | ICD-10-CM | POA: Insufficient documentation

## 2014-07-12 DIAGNOSIS — Z862 Personal history of diseases of the blood and blood-forming organs and certain disorders involving the immune mechanism: Secondary | ICD-10-CM | POA: Insufficient documentation

## 2014-07-12 DIAGNOSIS — A084 Viral intestinal infection, unspecified: Secondary | ICD-10-CM | POA: Insufficient documentation

## 2014-07-12 DIAGNOSIS — Z872 Personal history of diseases of the skin and subcutaneous tissue: Secondary | ICD-10-CM | POA: Insufficient documentation

## 2014-07-12 DIAGNOSIS — Z8742 Personal history of other diseases of the female genital tract: Secondary | ICD-10-CM | POA: Insufficient documentation

## 2014-07-12 DIAGNOSIS — Z792 Long term (current) use of antibiotics: Secondary | ICD-10-CM | POA: Insufficient documentation

## 2014-07-12 DIAGNOSIS — Z3202 Encounter for pregnancy test, result negative: Secondary | ICD-10-CM | POA: Insufficient documentation

## 2014-07-12 LAB — URINALYSIS, ROUTINE W REFLEX MICROSCOPIC
Bilirubin Urine: NEGATIVE
Glucose, UA: NEGATIVE mg/dL
Hgb urine dipstick: NEGATIVE
Ketones, ur: NEGATIVE mg/dL
LEUKOCYTES UA: NEGATIVE
Nitrite: NEGATIVE
Protein, ur: NEGATIVE mg/dL
SPECIFIC GRAVITY, URINE: 1.017 (ref 1.005–1.030)
UROBILINOGEN UA: 1 mg/dL (ref 0.0–1.0)
pH: 7.5 (ref 5.0–8.0)

## 2014-07-12 LAB — COMPREHENSIVE METABOLIC PANEL
ALBUMIN: 4.2 g/dL (ref 3.5–5.2)
ALT: 12 U/L (ref 0–35)
ANION GAP: 6 (ref 5–15)
AST: 15 U/L (ref 0–37)
Alkaline Phosphatase: 54 U/L (ref 39–117)
BUN: 10 mg/dL (ref 6–23)
CALCIUM: 9.2 mg/dL (ref 8.4–10.5)
CHLORIDE: 107 mmol/L (ref 96–112)
CO2: 26 mmol/L (ref 19–32)
Creatinine, Ser: 0.71 mg/dL (ref 0.50–1.10)
Glucose, Bld: 85 mg/dL (ref 70–99)
Potassium: 3.6 mmol/L (ref 3.5–5.1)
SODIUM: 139 mmol/L (ref 135–145)
Total Bilirubin: 0.2 mg/dL — ABNORMAL LOW (ref 0.3–1.2)
Total Protein: 7.3 g/dL (ref 6.0–8.3)

## 2014-07-12 LAB — CBC WITH DIFFERENTIAL/PLATELET
BASOS PCT: 0 % (ref 0–1)
Basophils Absolute: 0 10*3/uL (ref 0.0–0.1)
EOS ABS: 0 10*3/uL (ref 0.0–0.7)
Eosinophils Relative: 0 % (ref 0–5)
HEMATOCRIT: 38.1 % (ref 36.0–46.0)
HEMOGLOBIN: 12.9 g/dL (ref 12.0–15.0)
LYMPHS ABS: 1.9 10*3/uL (ref 0.7–4.0)
Lymphocytes Relative: 41 % (ref 12–46)
MCH: 29.6 pg (ref 26.0–34.0)
MCHC: 33.9 g/dL (ref 30.0–36.0)
MCV: 87.4 fL (ref 78.0–100.0)
MONO ABS: 0.3 10*3/uL (ref 0.1–1.0)
MONOS PCT: 7 % (ref 3–12)
Neutro Abs: 2.4 10*3/uL (ref 1.7–7.7)
Neutrophils Relative %: 52 % (ref 43–77)
PLATELETS: 260 10*3/uL (ref 150–400)
RBC: 4.36 MIL/uL (ref 3.87–5.11)
RDW: 13.1 % (ref 11.5–15.5)
WBC: 4.7 10*3/uL (ref 4.0–10.5)

## 2014-07-12 LAB — PREGNANCY, URINE: Preg Test, Ur: NEGATIVE

## 2014-07-12 LAB — LIPASE, BLOOD: LIPASE: 22 U/L (ref 11–59)

## 2014-07-12 MED ORDER — ONDANSETRON HCL 4 MG PO TABS: 4.0000 mg | ORAL_TABLET | Freq: Three times a day (TID) | ORAL | Status: DC | PRN

## 2014-07-12 MED ORDER — ONDANSETRON HCL 4 MG/2ML IJ SOLN
4.0000 mg | Freq: Once | INTRAMUSCULAR | Status: AC
  Administered 2014-07-12: 4 mg via INTRAVENOUS
  Filled 2014-07-12: qty 2

## 2014-07-12 MED ORDER — SODIUM CHLORIDE 0.9 % IV BOLUS (SEPSIS)
1000.0000 mL | Freq: Once | INTRAVENOUS | Status: AC
  Administered 2014-07-12: 1000 mL via INTRAVENOUS

## 2014-07-12 NOTE — ED Notes (Signed)
Patient try to give urine but not able  Give urine at this time. Will try again later

## 2014-07-12 NOTE — ED Notes (Signed)
Pt complains of nausea/vomiting/diarrhea/abdominal pain since last night. [place] was told to go to ED after she vomited on an X-ray machine. Pt took pepto bismal last night, which provided temporary relief.

## 2014-07-12 NOTE — ED Provider Notes (Signed)
CSN: [id number]     Arrival date & time 07/12/14  1344 History   First MD Initiated Contact with Patient 07/12/14 1508     Chief Complaint  Patient presents with  . Nausea  . Diarrhea  . Emesis  . Abdominal Pain     (Consider location/radiation/quality/duration/timing/severity/associated sxs/prior Treatment) HPI Margaret Cameron is a 34 year old female who presents the ER complaining of nausea, vomiting, diarrhea since yesterday. Patient states her symptoms have been intermittent throughout the day yesterday, and today. Patient reports multiple episodes of nonbilious, nonbloody vomiting, along with multiple episodes of loose diarrhea throughout the day. Patient reports an associated mild cramping feeling in her abdomen just before she has to have a bowel movement. Patient states after bowel movement. Relieves her cramping sensation completely. Patient denies fever, chills, dizziness, headache, blurred vision, chest pain, shortness of breath, dysuria.  Past Medical History  Diagnosis Date  . Pyelonephritis     x 3 episodes  . Abnormal Pap smear of cervix     LEEP 2011  . Hypoglycemia   . Anemia     with pregnancy  . Chronic headaches   . Cyst near coccyx   . Pilonidal cyst   . Sickle cell trait    Past Surgical History  Procedure Laterality Date  . Dilation and curettage of uterus  2008  . Leep  2011  . Other surgical history      Pynlodial cyst removed from tail bone 2012  . Other surgical history  10/2010    removal of cyst near [facility]  . Pilonidal cyst excision  10/2010   Family History  Problem Relation Age of Onset  . Hypertension Brother   . Cancer Maternal Aunt     breast   History  Substance Use Topics  . Smoking status: Former Smoker -- 0.10 packs/day    Types: Cigarettes    Quit date: 07/14/2012  . Smokeless tobacco: Never Used  . Alcohol Use: No   OB History    Gravida Para Term Preterm AB TAB SAB Ectopic Multiple Living   5 2 2  0 3 1 2  0 0 2      Review of Systems  Constitutional: Negative for fever.  HENT: Negative for trouble swallowing.   Eyes: Negative for visual disturbance.  Respiratory: Negative for shortness of breath.   Cardiovascular: Negative for chest pain.  Gastrointestinal: Positive for nausea, vomiting and diarrhea. Negative for abdominal pain.       Abdominal cramping  Genitourinary: Negative for dysuria, vaginal bleeding, vaginal discharge and vaginal pain.  Musculoskeletal: Negative for neck pain.  Skin: Negative for rash.  Neurological: Negative for dizziness, weakness and numbness.  Psychiatric/Behavioral: Negative.       Allergies  Codeine and Septra  Home Medications   Prior to Admission medications   Medication Sig Start Date End Date Taking? Authorizing Provider  bismuth subsalicylate (PEPTO BISMOL) 262 MG/15ML suspension Take 30 mLs by mouth every 6 (six) hours as needed for diarrhea or loose stools (nausea and diarrhea).   Yes Historical Provider, MD  albuterol (PROVENTIL HFA;VENTOLIN HFA) 108 (90 BASE) MCG/ACT inhaler Inhale 1-2 puffs into the lungs every 6 (six) hours as needed for wheezing or shortness of breath. Patient not taking: Reported on 07/12/2014 05/03/14   Courtney A Forcucci, PA-C  azithromycin (ZITHROMAX) 250 MG tablet Take 1 tablet (250 mg total) by mouth daily. Take first 2 tablets together, then 1 every day until finished. Patient not taking: Reported on 07/12/2014 05/03/14  Courtney A Forcucci, PA-C  diclofenac sodium (VOLTAREN) 1 % GEL Apply 1 application topically 4 (four) times daily. Patient not taking: Reported on 05/03/2014 10/14/13   Janne Napoleon, NP  guaiFENesin (ROBITUSSIN) 100 MG/5ML liquid Take 5-10 mLs (100-200 mg total) by mouth every 4 (four) hours as needed for cough. Patient not taking: Reported on 07/12/2014 05/03/14   Courtney A Forcucci, PA-C  ibuprofen (ADVIL,MOTRIN) 600 MG tablet Take 1 tablet (600 mg total) by mouth every 6 (six) hours. Patient not taking:  Reported on 05/03/2014 02/26/13   Crawford Givens, MD  ondansetron (ZOFRAN) 4 MG tablet Take 1 tablet (4 mg total) by mouth every 8 (eight) hours as needed for nausea or vomiting. 07/12/14   Carrie Mew, PA-C  ondansetron (ZOFRAN) 8 MG tablet Take 1 tablet (8 mg total) by mouth every 8 (eight) hours as needed for nausea. Patient not taking: Reported on 07/12/2014 05/03/14   Loma Sousa A Forcucci, PA-C   BP 110/58 mmHg  Pulse 65  Temp(Src) 97.8 F (36.6 C) (Oral)  Resp 16  SpO2 100%  LMP 06/16/2014 Physical Exam  Constitutional: She is oriented to person, place, and time. She appears well-developed and well-nourished. No distress.  HENT:  Head: Normocephalic and atraumatic.  Mouth/Throat: Oropharynx is clear and moist. No oropharyngeal exudate.  Eyes: Right eye exhibits no discharge. Left eye exhibits no discharge. No scleral icterus.  Neck: Normal range of motion.  Cardiovascular: Normal rate, regular rhythm, S1 normal, S2 normal and normal heart sounds.   No murmur heard. Pulmonary/Chest: Effort normal and breath sounds normal. No accessory muscle usage. No tachypnea. No respiratory distress.  Abdominal: Soft. Normal appearance and bowel sounds are normal. There is no tenderness. There is no rigidity, no rebound, no guarding, no tenderness at McBurney's point and negative Murphy's sign.  Musculoskeletal: Normal range of motion. She exhibits no edema or tenderness.  Neurological: She is alert and oriented to person, place, and time. No cranial nerve deficit. Coordination normal.  Skin: Skin is warm and dry. No rash noted. She is not diaphoretic.  Psychiatric: She has a normal mood and affect.  Nursing note and vitals reviewed.   ED Course  Procedures (including critical care time) Labs Review Labs Reviewed  COMPREHENSIVE METABOLIC PANEL - Abnormal; Notable for the following:    Total Bilirubin 0.2 (*)    All other components within normal limits  CBC WITH DIFFERENTIAL/PLATELET   LIPASE, BLOOD  URINALYSIS, ROUTINE W REFLEX MICROSCOPIC  PREGNANCY, URINE    Imaging Review No results found.   EKG Interpretation None      MDM   Final diagnoses:  Viral gastroenteritis    Patient with symptoms consistent with viral gastroenteritis.  Vitals are stable, no fever.  No signs of dehydration, tolerating PO fluids > 6 oz.  Lungs are clear.  No focal abdominal pain, no concern for appendicitis, cholecystitis, pancreatitis, ruptured viscus, UTI, kidney stone, or any other abdominal etiology. Urine pregnancy negative.  Patient to be discharged with symptomatic therapy. I discussed return precautions with patient, and patient verbalizes understanding and agreement of this plan. I encouraged patient to call or return to the ER should she have any questions or concerns.  BP 110/58 mmHg  Pulse 65  Temp(Src) 97.8 F (36.6 C) (Oral)  Resp 16  SpO2 100%  LMP 06/16/2014  Signed,  Dahlia Bailiff, PA-C 6:35 PM  Patient discussed with Dr. Debby Freiberg, MD     Carrie Mew, PA-C 07/12/14 1835  Debby Freiberg, MD  07/13/14 1535 

## 2014-07-12 NOTE — Discharge Instructions (Signed)
Viral Gastroenteritis °Viral gastroenteritis is also known as stomach flu. This condition affects the stomach and intestinal tract. It can cause sudden diarrhea and vomiting. The illness typically lasts 3 to 8 days. Most people develop an immune response that eventually gets rid of the virus. While this natural response develops, the virus can make you quite ill. °CAUSES  °Many different viruses can cause gastroenteritis, such as rotavirus or noroviruses. You can catch one of these viruses by consuming contaminated food or water. You may also catch a virus by sharing utensils or other personal items with an infected person or by touching a contaminated surface. °SYMPTOMS  °The most common symptoms are diarrhea and vomiting. These problems can cause a severe loss of body fluids (dehydration) and a body salt (electrolyte) imbalance. Other symptoms may include: °· Fever. °· Headache. °· Fatigue. °· Abdominal pain. °DIAGNOSIS  °Your caregiver can usually diagnose viral gastroenteritis based on your symptoms and a physical exam. A stool sample may also be taken to test for the presence of viruses or other infections. °TREATMENT  °This illness typically goes away on its own. Treatments are aimed at rehydration. The most serious cases of viral gastroenteritis involve vomiting so severely that you are not able to keep fluids down. In these cases, fluids must be given through an intravenous line (IV). °HOME CARE INSTRUCTIONS  °· Drink enough fluids to keep your urine clear or pale yellow. Drink small amounts of fluids frequently and increase the amounts as tolerated. °· Ask your caregiver for specific rehydration instructions. °· Avoid: °¨ Foods high in sugar. °¨ Alcohol. °¨ Carbonated drinks. °¨ Tobacco. °¨ Juice. °¨ Caffeine drinks. °¨ Extremely hot or cold fluids. °¨ Fatty, greasy foods. °¨ Too much intake of anything at one time. °¨ Dairy products until 24 to 48 hours after diarrhea stops. °· You may consume probiotics.  Probiotics are active cultures of beneficial bacteria. They may lessen the amount and number of diarrheal stools in adults. Probiotics can be found in yogurt with active cultures and in supplements. °· Wash your hands well to avoid spreading the virus. °· Only take over-the-counter or prescription medicines for pain, discomfort, or fever as directed by your caregiver. Do not give aspirin to children. Antidiarrheal medicines are not recommended. °· Ask your caregiver if you should continue to take your regular prescribed and over-the-counter medicines. °· Keep all follow-up appointments as directed by your caregiver. °SEEK IMMEDIATE MEDICAL CARE IF:  °· You are unable to keep fluids down. °· You do not urinate at least once every 6 to 8 hours. °· You develop shortness of breath. °· You notice blood in your stool or vomit. This may look like coffee grounds. °· You have abdominal pain that increases or is concentrated in one small area (localized). °· You have persistent vomiting or diarrhea. °· You have a fever. °· The patient is a child younger than 3 months, and he or she has a fever. °· The patient is a child older than 3 months, and he or she has a fever and persistent symptoms. °· The patient is a child older than 3 months, and he or she has a fever and symptoms suddenly get worse. °· The patient is a baby, and he or she has no tears when crying. °MAKE SURE YOU:  °· Understand these instructions. °· Will watch your condition. °· Will get help right away if you are not doing well or get worse. °Document Released: 05/09/2005 Document Revised: 08/01/2011 Document Reviewed: 02/23/2011 °  ExitCare Patient Information 2015 [place]. This information is not intended to replace advice given to you by your health care provider. Make sure you discuss any questions you have with your health care provider.   Food Choices to Help Relieve Diarrhea When you have diarrhea, the foods you eat and your eating habits are  very important. Choosing the right foods and drinks can help relieve diarrhea. Also, because diarrhea can last up to 7 days, you need to replace lost fluids and electrolytes (such as sodium, potassium, and chloride) in order to help prevent dehydration.  WHAT GENERAL GUIDELINES DO I NEED TO FOLLOW?  Slowly drink 1 cup (8 oz) of fluid for each episode of diarrhea. If you are getting enough fluid, your urine will be clear or pale yellow.  Eat starchy foods. Some good choices include white rice, white toast, pasta, low-fiber cereal, baked potatoes (without the skin), saltine crackers, and bagels.  Avoid large servings of any cooked vegetables.  Limit fruit to two servings per day. A serving is  cup or 1 small piece.  Choose foods with less than 2 g of fiber per serving.  Limit fats to less than 8 tsp (38 g) per day.  Avoid fried foods.  Eat foods that have probiotics in them. Probiotics can be found in certain dairy products.  Avoid foods and beverages that may increase the speed at which food moves through the stomach and intestines (gastrointestinal tract). Things to avoid include:  High-fiber foods, such as dried fruit, raw fruits and vegetables, nuts, seeds, and whole grain foods.  Spicy foods and high-fat foods.  Foods and beverages sweetened with high-fructose corn syrup, honey, or sugar alcohols such as xylitol, sorbitol, and mannitol. WHAT FOODS ARE RECOMMENDED? Grains White rice. White, [place], or pita breads (fresh or toasted), including plain rolls, buns, or bagels. White pasta. Saltine, soda, or graham crackers. Pretzels. Low-fiber cereal. Cooked cereals made with water (such as cornmeal, farina, or cream cereals). Plain muffins. Matzo. Melba toast. Zwieback.  Vegetables Potatoes (without the skin). Strained tomato and vegetable juices. Most well-cooked and canned vegetables without seeds. Tender lettuce. Fruits Cooked or canned applesauce, apricots, cherries, fruit  cocktail, grapefruit, peaches, pears, or plums. Fresh bananas, apples without skin, cherries, grapes, cantaloupe, grapefruit, peaches, oranges, or plums.  Meat and Other Protein Products Baked or boiled chicken. Eggs. Tofu. Fish. Seafood. Smooth peanut butter. Ground or well-cooked tender beef, ham, veal, lamb, pork, or poultry.  Dairy Plain yogurt, kefir, and unsweetened liquid yogurt. Lactose-free milk, buttermilk, or soy milk. Plain hard cheese. Beverages Sport drinks. Clear broths. Diluted fruit juices (except prune). Regular, caffeine-free sodas such as ginger ale. Water. Decaffeinated teas. Oral rehydration solutions. Sugar-free beverages not sweetened with sugar alcohols. Other Bouillon, broth, or soups made from recommended foods.  The items listed above may not be a complete list of recommended foods or beverages. Contact your dietitian for more options. WHAT FOODS ARE NOT RECOMMENDED? Grains Whole grain, whole wheat, bran, or rye breads, rolls, pastas, crackers, and cereals. Wild or brown rice. Cereals that contain more than 2 g of fiber per serving. Corn tortillas or taco shells. Cooked or dry oatmeal. Granola. Popcorn. Vegetables Raw vegetables. Cabbage, broccoli, Brussels sprouts, artichokes, baked beans, beet greens, corn, kale, legumes, peas, sweet potatoes, and yams. Potato skins. Cooked spinach and cabbage. Fruits Dried fruit, including raisins and dates. Raw fruits. Stewed or dried prunes. Fresh apples with skin, apricots, mangoes, pears, raspberries, and strawberries.  Meat and Other [place]  peanut butter. Nuts and seeds. Beans and lentils. Berniece Salines.  Dairy High-fat cheeses. Milk, chocolate milk, and beverages made with milk, such as milk shakes. Cream. Ice cream. Sweets and Desserts Sweet rolls, doughnuts, and sweet breads. Pancakes and waffles. Fats and Oils Butter. Cream sauces. Margarine. Salad oils. Plain salad dressings. Olives. Avocados.   Beverages Caffeinated beverages (such as coffee, tea, soda, or energy drinks). Alcoholic beverages. Fruit juices with pulp. Prune juice. Soft drinks sweetened with high-fructose corn syrup or sugar alcohols. Other Coconut. Hot sauce. Chili powder. Mayonnaise. Gravy. Cream-based or milk-based soups.  The items listed above may not be a complete list of foods and beverages to avoid. Contact your dietitian for more information. WHAT SHOULD I DO IF I BECOME DEHYDRATED? Diarrhea can sometimes lead to dehydration. Signs of dehydration include dark urine and dry mouth and skin. If you think you are dehydrated, you should rehydrate with an oral rehydration solution. These solutions can be purchased at pharmacies, retail stores, or online.  Drink -1 cup (120-240 mL) of oral rehydration solution each time you have an episode of diarrhea. If drinking this amount makes your diarrhea worse, try drinking smaller amounts more often. For example, drink 1-3 tsp (5-15 mL) every 5-10 minutes.  A general rule for staying hydrated is to drink 1-2 L of fluid per day. Talk to your health care provider about the specific amount you should be drinking each day. Drink enough fluids to keep your urine clear or pale yellow. Document Released: 07/30/2003 Document Revised: 05/14/2013 Document Reviewed: 04/01/2013 [facility] Patient Information 2015 [place], [place]. This information is not intended to replace advice given to you by your health care provider. Make sure you discuss any questions you have with your health care provider.   Emergency Department Resource Guide 1) Find a Doctor and Pay Out of Pocket Although you won't have to find out who is covered by your insurance plan, it is a good idea to ask around and get recommendations. You will then need to call the office and see if the doctor you have chosen will accept you as a new patient and what types of options they offer for patients who are self-pay. Some doctors  offer discounts or will set up payment plans for their patients who do not have insurance, but you will need to ask so you aren't surprised when you get to your appointment.  2) Contact Your Local Health Department Not all health departments have doctors that can see patients for sick visits, but many do, so it is worth a call to see if yours does. If you don't know where your local health department is, you can check in your phone book. The CDC also has a tool to help you locate your state's health department, and many state websites also have listings of all of their local health departments.  3) Find a [facility] If your illness is not likely to be very severe or complicated, you may want to try a walk in clinic. These are popping up all over the country in pharmacies, drugstores, and shopping centers. They're usually staffed by nurse practitioners or physician assistants that have been trained to treat common illnesses and complaints. They're usually fairly quick and inexpensive. However, if you have serious medical issues or chronic medical problems, these are probably not your best option.  No Primary Care Doctor: - Call Health Connect at  [phone] - they can help you locate a primary care doctor that  accepts your insurance, provides  certain services, etc. - Physician Referral Service- [phone]  Chronic Pain Problems: Organization         Address  Phone   Notes  [facility]  [phone] Patients need to be referred by their primary care doctor.   Medication Assistance: Organization         Address  Phone   Notes  [facility] Medication [facility] [address] [place], [place] [address] [phone] --Must be a resident of [facility] -- Must have NO insurance coverage whatsoever (no Medicaid/ Medicare, etc.) -- The pt. MUST have a primary care doctor that directs their care regularly and follows them in the community    MedAssist  [phone]   [employer]  [phone]    Agencies that provide inexpensive medical care: Organization         Address  Phone   Notes  [place]  [phone]   [facility]    [phone]   [facility] [place], [place] [address] [phone]   [place] [address], [place] [phone]   Planned Parenthood    [phone]   [facility]    [phone]   [place] and [place] [address], [place] Phone:  [phone], Fax:  [phone] Hours of Operation:  9 am - 6 pm, M-F.  Also accepts Medicaid/Medicare and self-pay.  [facility] for [place], [address], [place] Phone: [phone], Fax: [phone]. Hours of Operation:  8:30 am - 5:30 pm, M-F.  Also accepts Medicaid and self-pay.  [facility] [place] [address], [place] Phone: [phone]   [place], [place], [place] [phone], Ext. [phone] Mondays & Thursdays: 7-9 AM.  First 15 patients are seen on a first come, first serve basis.    [place] Providers:  Organization         Address  Phone   Notes  [facility] [address],  [phone] Also accepts self-pay patients.  [facility] [address], [place]  [phone]   [place], [address], [place] [phone]   [facility] [address] [place] [phone]   Lucianne Lei [address], [place]   [phone] Only accepts [place] Access [place] patients after they have their name applied to their card.   Self-Pay (no insurance) in [facility]:  Organization         Address  Phone   Notes  Sickle Cell Patients, [facility] Internal  Medicine [place] [phone]   [facility] [place] [phone]   [facility] [place]  [place], [place], [place] [phone]   [facility]/Dr. Osei-Bonsu  [address] [place] or [address], [place] [phone] Phone number for both [place] and [place] locations is the same.  Urgent Medical and [facility] [address] [place] [phone]   [facility] [address], [place] or [address] [phone]   [facility] [address]  [address] [place] [phone], phone; [phone], fax Sees patients 1st and 3rd Saturday of every month.  Must not qualify for public or private insurance (i.e. Medicaid, Medicare, [place] Health Choice, Veterans' Benefits)  Household income should be no more than 200% of the poverty level The clinic cannot treat you if you are pregnant or think you are pregnant  Sexually transmitted diseases are not treated at the clinic.    Dental Care: Organization         Address  Phone  Notes  [facility] Department of [facility] Ken Caryl [phone] Accepts children up to age 56 who are enrolled in [place] or [place]; pregnant women with a Medicaid card; and children who have applied for Medicaid or [place] Health Choice, but were declined, whose parents can pay a reduced fee at time of service.  [facility] Department of [facility]  [address], [place] [phone] Accepts children up to age 3 who are enrolled in [place] or [place]; pregnant women with a Medicaid card; and children who have applied for Medicaid or [place] Health Choice, but were declined, whose parents can pay a reduced fee at time of service.  [place] Adult Dental Access PROGRAM  [place] [phone] Patients are seen by appointment only. Walk-ins are not accepted. [place] will see patients 54 years of age and older. Monday - Tuesday (8am-5pm) Most Wednesdays (8:30-5pm) $30 per visit, cash only  [facility] Access PRO[facility]  [address], [facility] [phone] Patients are seen by appointment only. Walk-ins are not accepted. [place] will see patients 75 years of age and older. One Wednesday Evening (Monthly: Volunteer Based).  $30 per visit, cash only  [place]  [phone] for adults; Children under age 78, call Graduate Pediatric Dentistry at [phone]. Children aged 100-14, please call [phone] to request a pediatric application.  Dental services are provided in all areas of dental care including fillings, crowns and bridges, complete and partial dentures, implants, gum treatment, root canals, and extractions. Preventive care is also provided. Treatment is provided to both adults and children. Patients are selected via a lottery and there is often a waiting list.   [facility] [address], [place]  [phone] [email or web address]   [facility] [address] [place], [place] [phone], Ext. [phone] Second and Fourth Thursday of each month, opens at 6:30 AM; Clinic ends at 9 AM.  Patients are seen on a first-come first-served basis, and a limited number are seen during each clinic.   [facility]  [address] Hillard Danker [place], [place] [phone]   Eligibility Requirements You must have lived in [place], [place], or [place] counties for at least the last three months.   You cannot be eligible for state or federal sponsored [employer], including [employer], [place], or [employer].   You generally cannot be eligible for healthcare insurance through your employer.    How to apply: Eligibility screenings are held every Tuesday and  Wednesday afternoon from 1:00 pm until 4:00 pm. You do not need an appointment for the interview!  [facility] [address] [place], [place]   [place]  [place]  [place]  [phone]  Behavioral Health Resources in the Community: Intensive Outpatient Programs Organization         Address  Phone  Notes  [place] Ricardo. [address] [place], [place] [phone]   [facility] [address], [place], [place]   ADS: Alcohol & Drug Svcs [address], [place], [place]   [place] [address],  [place], [place] or [phone]   Substance Abuse Resources Organization         Address  Phone  Notes  Alcohol and Drug Services  [phone]   [place]  [phone]   [facility] [place]   Chinita Pester  [phone]   Residential & Outpatient Substance Abuse Program  [phone]   Psychological Services Organization         Address  Phone  Notes  [facility] [place]  [place]  [phone]   [place] [address], [place] or [phone]    Mobile Crisis Teams Organization         Address  Phone  Notes  [facility], Mobile Crisis Care Unit  [phone]   [facility]  [address] [place], [place]   [place] [address] [place] [phone]    Self-Help/Support Groups Organization         Address  Phone             Notes  [place]. of [place] - variety of support groups  [place] Call for more information  Narcotics Anonymous (NA), [facility] [address], [employer] [place]  2 meetings at this location   Financial planner         Address  Phone  Notes  [facility]idential [facility] [place],    [place]  [phone]   [facility]  [address], [place] [phone], [place], [place]   [place], [place] [phone] Admissions: 8am-3pm M-F  Incentives Substance [place] [address]    [place], [place] [phone]   [facility] [address] [place], [place], [place]   [facility] [address],  [place], [place]   [facility] - Intensive Outpatient [address] Kristeen [address], [place], [place]   [facility] ([place].,  [place], [place] [phone] or [phone]   [facility] Services (RTS) [address] [place], [place] Accepts Medicaid  [facility] [place] [address],  [place] [phone] Substance Abuse/Addiction Treatment   [facility] Organization         Address  Phone  Notes  [facility]  [phone]   Domenic Schwab, PhD [address] Arlis Porta [place], [place]   [phone] or [phone]   [place], [place] [phone]   [place] [address] [place], [place] [phone] Insurance/Medicaid/sponsorship through [employer] and Families [address] [place]                                    [place], [place] [phone] [place],  [place] [phone]    Dr. Adele Schilder  [phone]   [facility] Dept. 1) [address] [place] 2) [place] 3)  [place] [address], [place] [phone]  [phone]   [place] [phone] or [phone] (After Hours)

## 2014-10-30 ENCOUNTER — Encounter (HOSPITAL_COMMUNITY): Payer: Self-pay | Admitting: *Deleted

## 2014-10-30 ENCOUNTER — Emergency Department (HOSPITAL_COMMUNITY): Payer: Self-pay

## 2014-10-30 ENCOUNTER — Emergency Department (HOSPITAL_COMMUNITY)
Admission: EM | Admit: 2014-10-30 | Discharge: 2014-10-30 | Disposition: A | Discharge status: Home / Self Care | Payer: Self-pay | Attending: Emergency Medicine | Admitting: Emergency Medicine

## 2014-10-30 DIAGNOSIS — Z7951 Long term (current) use of inhaled steroids: Secondary | ICD-10-CM | POA: Insufficient documentation

## 2014-10-30 DIAGNOSIS — Z3202 Encounter for pregnancy test, result negative: Secondary | ICD-10-CM | POA: Insufficient documentation

## 2014-10-30 DIAGNOSIS — R0602 Shortness of breath: Secondary | ICD-10-CM | POA: Insufficient documentation

## 2014-10-30 DIAGNOSIS — Z87448 Personal history of other diseases of urinary system: Secondary | ICD-10-CM | POA: Insufficient documentation

## 2014-10-30 DIAGNOSIS — J3489 Other specified disorders of nose and nasal sinuses: Secondary | ICD-10-CM | POA: Insufficient documentation

## 2014-10-30 DIAGNOSIS — Z862 Personal history of diseases of the blood and blood-forming organs and certain disorders involving the immune mechanism: Secondary | ICD-10-CM | POA: Insufficient documentation

## 2014-10-30 DIAGNOSIS — Z872 Personal history of diseases of the skin and subcutaneous tissue: Secondary | ICD-10-CM | POA: Insufficient documentation

## 2014-10-30 DIAGNOSIS — Z79899 Other long term (current) drug therapy: Secondary | ICD-10-CM | POA: Insufficient documentation

## 2014-10-30 DIAGNOSIS — Z792 Long term (current) use of antibiotics: Secondary | ICD-10-CM | POA: Insufficient documentation

## 2014-10-30 DIAGNOSIS — R059 Cough, unspecified: Secondary | ICD-10-CM

## 2014-10-30 DIAGNOSIS — R05 Cough: Secondary | ICD-10-CM | POA: Insufficient documentation

## 2014-10-30 DIAGNOSIS — R079 Chest pain, unspecified: Secondary | ICD-10-CM | POA: Insufficient documentation

## 2014-10-30 DIAGNOSIS — G8929 Other chronic pain: Secondary | ICD-10-CM | POA: Insufficient documentation

## 2014-10-30 DIAGNOSIS — R109 Unspecified abdominal pain: Secondary | ICD-10-CM | POA: Insufficient documentation

## 2014-10-30 DIAGNOSIS — Z87891 Personal history of nicotine dependence: Secondary | ICD-10-CM | POA: Insufficient documentation

## 2014-10-30 DIAGNOSIS — R062 Wheezing: Secondary | ICD-10-CM | POA: Insufficient documentation

## 2014-10-30 LAB — URINALYSIS, ROUTINE W REFLEX MICROSCOPIC
BILIRUBIN URINE: NEGATIVE
GLUCOSE, UA: NEGATIVE mg/dL
Hgb urine dipstick: NEGATIVE
KETONES UR: NEGATIVE mg/dL
LEUKOCYTES UA: NEGATIVE
NITRITE: NEGATIVE
PH: 5 (ref 5.0–8.0)
PROTEIN: NEGATIVE mg/dL
Specific Gravity, Urine: 1.015 (ref 1.005–1.030)
Urobilinogen, UA: 0.2 mg/dL (ref 0.0–1.0)

## 2014-10-30 LAB — POC URINE PREG, ED: Preg Test, Ur: NEGATIVE

## 2014-10-30 MED ORDER — IPRATROPIUM BROMIDE 0.02 % IN SOLN
0.5000 mg | Freq: Once | RESPIRATORY_TRACT | Status: AC
  Administered 2014-10-30: 0.5 mg via RESPIRATORY_TRACT
  Filled 2014-10-30: qty 2.5

## 2014-10-30 MED ORDER — PREDNISONE 20 MG PO TABS: 40.0000 mg | ORAL_TABLET | Freq: Every day | ORAL | Status: DC

## 2014-10-30 MED ORDER — ALBUTEROL SULFATE (2.5 MG/3ML) 0.083% IN NEBU
5.0000 mg | INHALATION_SOLUTION | Freq: Once | RESPIRATORY_TRACT | Status: AC
  Administered 2014-10-30: 5 mg via RESPIRATORY_TRACT
  Filled 2014-10-30: qty 6

## 2014-10-30 MED ORDER — PREDNISONE 20 MG PO TABS
60.0000 mg | ORAL_TABLET | Freq: Once | ORAL | Status: AC
  Administered 2014-10-30: 60 mg via ORAL
  Filled 2014-10-30: qty 3

## 2014-10-30 MED ORDER — ALBUTEROL SULFATE HFA 108 (90 BASE) MCG/ACT IN AERS
1.0000 | INHALATION_SPRAY | RESPIRATORY_TRACT | Status: DC | PRN
  Administered 2014-10-30: 1 via RESPIRATORY_TRACT
  Filled 2014-10-30: qty 6.7

## 2014-10-30 NOTE — ED Notes (Signed)
Pt states cough since April without improvement.  States now wheezing and c/o chest pain when she coughs.  Also c/o R flank pain for several days, though denies urinary s/s or changes in bowel or bladder habit.s

## 2014-10-30 NOTE — ED Notes (Signed)
Pt monitored by pulse ox and bp cuff per [employer].

## 2014-10-30 NOTE — ED Notes (Signed)
PT returned from scans. Monitored by pulse ox and bp cuff.

## 2014-10-30 NOTE — ED Notes (Signed)
Off unit with xray. 

## 2014-10-30 NOTE — ED Notes (Signed)
Pt returned from xray

## 2014-10-30 NOTE — ED Provider Notes (Addendum)
CSN: [id number]     Arrival date & time 10/30/14  0813 History   First MD Initiated Contact with Patient 10/30/14 [phone]     Chief Complaint  Patient presents with  . Cough  . Flank Pain     (Consider location/radiation/quality/duration/timing/severity/associated sxs/prior Treatment) Patient is a 34 y.o. female presenting with cough and flank pain. The history is provided by the patient.  Cough Cough characteristics:  Productive Sputum characteristics:  White Severity:  Severe Onset quality:  Gradual Duration: 2 months. Timing:  Constant Progression:  Worsening Chronicity:  New Smoker: just quit in april when the cough started.   Context: not sick contacts and not upper respiratory infection   Relieved by:  Nothing Worsened by:  Lying down Ineffective treatments:  Cough suppressants, decongestant and steam Associated symptoms: chest pain, rhinorrhea, shortness of breath and wheezing   Associated symptoms: no chills, no fever and no headaches   Risk factors comment:  No hx of asthma Flank Pain Associated symptoms include chest pain and shortness of breath. Pertinent negatives include no headaches.    Past Medical History  Diagnosis Date  . Pyelonephritis     x 3 episodes  . Abnormal Pap smear of cervix     LEEP 2011  . Hypoglycemia   . Anemia     with pregnancy  . Chronic headaches   . Cyst near coccyx   . Pilonidal cyst   . Sickle cell trait    Past Surgical History  Procedure Laterality Date  . Dilation and curettage of uterus  2008  . Leep  2011  . Other surgical history      Pynlodial cyst removed from tail bone 2012  . Other surgical history  10/2010    removal of cyst near coxxyx  . Pilonidal cyst excision  10/2010   Family History  Problem Relation Age of Onset  . Hypertension Brother   . Cancer Maternal Aunt     breast   History  Substance Use Topics  . Smoking status: Former Smoker -- 0.10 packs/day    Types: Cigarettes    Quit date: 09/12/2014   . Smokeless tobacco: Never Used  . Alcohol Use: No   OB History    Gravida Para Term Preterm AB TAB SAB Ectopic Multiple Living   5 2 2  0 3 1 2  0 0 2     Review of Systems  Constitutional: Negative for fever and chills.  HENT: Positive for rhinorrhea.   Respiratory: Positive for cough, shortness of breath and wheezing.   Cardiovascular: Positive for chest pain.  Genitourinary: Positive for flank pain.  Neurological: Negative for headaches.  All other systems reviewed and are negative.     Allergies  Codeine and Septra  Home Medications   Prior to Admission medications   Medication Sig Start Date End Date Taking? Authorizing Provider  albuterol (PROVENTIL HFA;VENTOLIN HFA) 108 (90 BASE) MCG/ACT inhaler Inhale 1-2 puffs into the lungs every 6 (six) hours as needed for wheezing or shortness of breath. Patient not taking: Reported on 07/12/2014 05/03/14   Loma Sousa Forcucci, PA-C  azithromycin (ZITHROMAX) 250 MG tablet Take 1 tablet (250 mg total) by mouth daily. Take first 2 tablets together, then 1 every day until finished. Patient not taking: Reported on 07/12/2014 05/03/14   Loma Sousa Forcucci, PA-C  bismuth subsalicylate (PEPTO BISMOL) 262 MG/15ML suspension Take 30 mLs by mouth every 6 (six) hours as needed for diarrhea or loose stools (nausea and diarrhea).    Historical Provider,  MD  diclofenac sodium (VOLTAREN) 1 % GEL Apply 1 application topically 4 (four) times daily. Patient not taking: Reported on 05/03/2014 10/14/13   Janne Napoleon, NP  guaiFENesin (ROBITUSSIN) 100 MG/5ML liquid Take 5-10 mLs (100-200 mg total) by mouth every 4 (four) hours as needed for cough. Patient not taking: Reported on 07/12/2014 05/03/14   Starlyn Skeans, PA-C  ibuprofen (ADVIL,MOTRIN) 600 MG tablet Take 1 tablet (600 mg total) by mouth every 6 (six) hours. Patient not taking: Reported on 05/03/2014 02/26/13   Crawford Givens, MD  ondansetron (ZOFRAN) 4 MG tablet Take 1 tablet (4 mg total) by mouth  every 8 (eight) hours as needed for nausea or vomiting. 07/12/14   Dahlia Bailiff, PA-C  ondansetron (ZOFRAN) 8 MG tablet Take 1 tablet (8 mg total) by mouth every 8 (eight) hours as needed for nausea. Patient not taking: Reported on 07/12/2014 05/03/14   Loma Sousa Forcucci, PA-C   BP 121/61 mmHg  Pulse 82  Temp(Src) 98.3 F (36.8 C) (Oral)  Resp 18  Ht 5\' 6"  (1.676 m)  Wt 185 lb (83.915 kg)  BMI 29.87 kg/m2  SpO2 99%  LMP 10/11/2014 Physical Exam  Constitutional: She is oriented to person, place, and time. She appears well-developed and well-nourished. No distress.  HENT:  Head: Normocephalic and atraumatic.  Mouth/Throat: Oropharynx is clear and moist.  Eyes: Conjunctivae and EOM are normal. Pupils are equal, round, and reactive to light.  Neck: Normal range of motion. Neck supple.  Cardiovascular: Normal rate, regular rhythm and intact distal pulses.   No murmur heard. Pulmonary/Chest: Effort normal. No respiratory distress. She has wheezes. She has no rales.  Coughing continually on exam  Abdominal: Soft. Normal appearance. She exhibits no distension. There is no tenderness. There is CVA tenderness. There is no rebound and no guarding.  Mild right sided CVA tenderness  Musculoskeletal: Normal range of motion. She exhibits no edema or tenderness.  Neurological: She is alert and oriented to person, place, and time.  Skin: Skin is warm and dry. No rash noted. No erythema.  Psychiatric: She has a normal mood and affect. Her behavior is normal.  Nursing note and vitals reviewed.   ED Course  Procedures (including critical care time) Labs Review Labs Reviewed  URINALYSIS, ROUTINE W REFLEX MICROSCOPIC (NOT AT [facility])  POC URINE PREG, ED    Imaging Review Dg Chest 2 View  10/30/2014   CLINICAL DATA:  Cough for 2 months.  EXAM: CHEST - 2 VIEW  COMPARISON:  Two-view chest x-ray 05/03/2014.  FINDINGS: The heart size is normal. Mild interstitial coarsening is evident. The subtle left basilar  or lingular airspace density is noted. The lung apices are clear. The visualized soft tissues and bony thorax are unremarkable.  IMPRESSION: 1. Subtle left lower lobe or lingular airspace density likely reflects atelectasis. Infection is also considered. 2. Otherwise negative chest.   Electronically Signed   By: San Morelle M.D.   On: 10/30/2014 09:24     EKG Interpretation None      MDM   Final diagnoses:  Cough  Wheezing    Patient with a continuously worsening cough for the last month and a half with sputum production. She is smoking about the same time the cough started and has not gotten any relief with any of the over-the-counter medications. She denies fever but at night the cough is worse and she has more wheezing. She denies a history of asthma and has never used an inhaler.  On exam patient  is coughing continuously with occasional wheezing. We'll give albuterol and Atrovent. Chest x-ray pending.  Feel that this could be chronic bronchitis versus allergy related cough and wheezing.  Secondly patient is complaining of some mild right flank pain that started last night. She feels that it's most likely associated with all the coughing however she does have a history of pyelonephritis. She denies pain similar to the pyelonephritis and denies any urinary symptoms. LMP at the end of May.  Will check a UA to ensure no signs of infection. However think most likely this is muscular in nature.  10:02 AM Urine within normal limits. Chest x-ray with  Blanchie Dessert, MD 10/30/14 1002  Blanchie Dessert, MD 10/30/14 1004

## 2014-12-30 ENCOUNTER — Encounter (HOSPITAL_COMMUNITY): Payer: Self-pay | Admitting: *Deleted

## 2014-12-30 ENCOUNTER — Emergency Department (HOSPITAL_COMMUNITY)
Admission: EM | Admit: 2014-12-30 | Discharge: 2014-12-30 | Disposition: A | Discharge status: Home / Self Care | Payer: Self-pay | Attending: Emergency Medicine | Admitting: Emergency Medicine

## 2014-12-30 DIAGNOSIS — L02412 Cutaneous abscess of left axilla: Secondary | ICD-10-CM | POA: Insufficient documentation

## 2014-12-30 DIAGNOSIS — Z862 Personal history of diseases of the blood and blood-forming organs and certain disorders involving the immune mechanism: Secondary | ICD-10-CM | POA: Insufficient documentation

## 2014-12-30 DIAGNOSIS — Z87891 Personal history of nicotine dependence: Secondary | ICD-10-CM | POA: Insufficient documentation

## 2014-12-30 DIAGNOSIS — Z7952 Long term (current) use of systemic steroids: Secondary | ICD-10-CM | POA: Insufficient documentation

## 2014-12-30 DIAGNOSIS — Z8639 Personal history of other endocrine, nutritional and metabolic disease: Secondary | ICD-10-CM | POA: Insufficient documentation

## 2014-12-30 DIAGNOSIS — Z87448 Personal history of other diseases of urinary system: Secondary | ICD-10-CM | POA: Insufficient documentation

## 2014-12-30 MED ORDER — LIDOCAINE HCL 2 % IJ SOLN
20.0000 mL | Freq: Once | INTRAMUSCULAR | Status: AC
  Administered 2014-12-30: 400 mg
  Filled 2014-12-30: qty 20

## 2014-12-30 MED ORDER — OXYCODONE-ACETAMINOPHEN 5-325 MG PO TABS: 1.0000 | ORAL_TABLET | Freq: Four times a day (QID) | ORAL | Status: DC | PRN

## 2014-12-30 NOTE — ED Notes (Signed)
Pt reports abscess in L axilla x 1 week.  No drainage noted.

## 2014-12-30 NOTE — ED Provider Notes (Signed)
CSN: [id number]     Arrival date & time 12/30/14  0033 History   First MD Initiated Contact with Patient 12/30/14 0251     Chief Complaint  Patient presents with  . Abscess     (Consider location/radiation/quality/duration/timing/severity/associated sxs/prior Treatment) HPI   Patient is a 34 year old female with PMHx of recurrent abscesses who presents to the ED with an abscess in her left axilla for the past week.  Patient states she has had abscesses in her axilla previously with have required drainage.  She reports she has been placing warm compresses on the area without relief.  She has tried OTC pain medications which have only provided minimal relief.  She denies fever, chills, or other associated symptoms.  No additional concerns.   Past Medical History  Diagnosis Date  . Pyelonephritis     x 3 episodes  . Abnormal Pap smear of cervix     LEEP 2011  . Hypoglycemia   . Anemia     with pregnancy  . Chronic headaches   . Cyst near coccyx   . Pilonidal cyst   . Sickle cell trait    Past Surgical History  Procedure Laterality Date  . Dilation and curettage of uterus  2008  . Leep  2011  . Other surgical history      Pynlodial cyst removed from tail bone 2012  . Other surgical history  10/2010    removal of cyst near co[facility]xyx  . Pilonidal cyst excision  10/2010   Family History  Problem Relation Age of Onset  . Hypertension Brother   . Cancer Maternal Aunt     breast   History  Substance Use Topics  . Smoking status: Former Smoker -- 0.10 packs/day    Types: Cigarettes    Quit date: 09/12/2014  . Smokeless tobacco: Never Used  . Alcohol Use: No   OB History    Gravida Para Term Preterm AB TAB SAB Ectopic Multiple Living   5 2 2  0 3 1 2  0 0 2     Review of Systems All other systems negative except as documented in the HPI. All pertinent positives and negatives as reviewed in the HPI.   Allergies  Codeine and Septra  Home Medications   Prior to Admission  medications   Medication Sig Start Date End Date Taking? Authorizing Provider  acetaminophen (TYLENOL) 500 MG tablet Take 1,500 mg by mouth every 6 (six) hours as needed (for pain.).   Yes Historical Provider, MD  ondansetron (ZOFRAN) 4 MG tablet Take 1 tablet (4 mg total) by mouth every 8 (eight) hours as needed for nausea or vomiting. Patient not taking: Reported on 10/30/2014 07/12/14   Dahlia Bailiff, PA-C  predniSONE (DELTASONE) 20 MG tablet Take 2 tablets (40 mg total) by mouth daily. 10/30/14   Blanchie Dessert, MD   BP 121/62 mmHg  Pulse 77  Temp(Src) 98.9 F (37.2 C) (Oral)  Resp 18  Ht 5\' 6"  (1.676 m)  Wt 183 lb (83.008 kg)  BMI 29.55 kg/m2  SpO2 100%  LMP 12/10/2014 Physical Exam  Constitutional: She is oriented to person, place, and time. She appears well-developed and well-nourished. No distress.  Cardiovascular: Normal rate and regular rhythm.   Pulmonary/Chest: Effort normal.  Musculoskeletal:  3 cm, fluctuant area in the L axilla.  No erythema or warmth.   Neurological: She is alert and oriented to person, place, and time.  Skin: Skin is warm and dry.  Psychiatric: She has a  normal mood and affect. Her behavior is normal.  Nursing note and vitals reviewed.   ED Course  Procedures (including critical care time)  INCISION AND DRAINAGE Performed by: Brent General Consent: Verbal consent obtained. Risks and benefits: risks, benefits and alternatives were discussed Type: abscess  Body area: L axilla  Anesthesia: local infiltration  Incision was made with a scalpel.  Local anesthetic: lidocaine 2% without epinephrine  Anesthetic total: 8 ml  Complexity: complex Blunt dissection to break up loculations  Drainage: purulent  Drainage amount: large  Packing material: 1/4 in iodoform gauze  Patient tolerance: Patient tolerated the procedure well with no immediate complications.   Patient will be referred to surgery she has had multiple abscesses in her  axilla.  Told to return here as needed.  Have packing and area reassessed in 2-3 days   Dalia Heading, PA-C 12/30/14 0500  Shanon Rosser, MD 12/30/14 [phone]

## 2014-12-30 NOTE — ED Notes (Signed)
Using clean technique, applied 4x4 gauze to I&D abscess. Secured with surgical tape. Pt tolerated it well.

## 2014-12-30 NOTE — Discharge Instructions (Signed)

## 2014-12-31 ENCOUNTER — Encounter (HOSPITAL_COMMUNITY): Payer: Self-pay | Admitting: Emergency Medicine

## 2014-12-31 ENCOUNTER — Emergency Department (HOSPITAL_COMMUNITY)
Admission: EM | Admit: 2014-12-31 | Discharge: 2014-12-31 | Disposition: A | Discharge status: Home / Self Care | Payer: Self-pay | Attending: Emergency Medicine | Admitting: Emergency Medicine

## 2014-12-31 DIAGNOSIS — Z87891 Personal history of nicotine dependence: Secondary | ICD-10-CM | POA: Insufficient documentation

## 2014-12-31 DIAGNOSIS — G8929 Other chronic pain: Secondary | ICD-10-CM | POA: Insufficient documentation

## 2014-12-31 DIAGNOSIS — Z862 Personal history of diseases of the blood and blood-forming organs and certain disorders involving the immune mechanism: Secondary | ICD-10-CM | POA: Insufficient documentation

## 2014-12-31 DIAGNOSIS — Z7952 Long term (current) use of systemic steroids: Secondary | ICD-10-CM | POA: Insufficient documentation

## 2014-12-31 DIAGNOSIS — Z872 Personal history of diseases of the skin and subcutaneous tissue: Secondary | ICD-10-CM | POA: Insufficient documentation

## 2014-12-31 DIAGNOSIS — Z87448 Personal history of other diseases of urinary system: Secondary | ICD-10-CM | POA: Insufficient documentation

## 2014-12-31 DIAGNOSIS — Z09 Encounter for follow-up examination after completed treatment for conditions other than malignant neoplasm: Secondary | ICD-10-CM

## 2014-12-31 DIAGNOSIS — Z48817 Encounter for surgical aftercare following surgery on the skin and subcutaneous tissue: Secondary | ICD-10-CM | POA: Insufficient documentation

## 2014-12-31 NOTE — Discharge Instructions (Signed)
Incision Care °An incision is when a surgeon cuts into your body tissues. After surgery, the incision needs to be cared for properly to prevent infection.  °HOME CARE INSTRUCTIONS  °· Take all medicine as directed by your caregiver. Only take over-the-counter or prescription medicines for pain, discomfort, or fever as directed by your caregiver. °· Do not remove your bandage (dressing) or get your incision wet until your surgeon gives you permission. In the event that your dressing becomes wet, dirty, or starts to smell, change the dressing and call your surgeon for instructions as soon as possible. °· Take showers. Do not take tub baths, swim, or do anything that may soak the wound until it is healed. °· Resume your normal diet and activities as directed or allowed. °· Avoid lifting any weight until you are instructed otherwise. °· Use anti-itch antihistamine medicine as directed by your caregiver. The wound may itch when it is healing. Do not pick or scratch at the wound. °· Follow up with your caregiver for stitch (suture) or staple removal as directed. °· Drink enough fluids to keep your urine clear or pale yellow. °SEEK MEDICAL CARE IF:  °· You have redness, swelling, or increasing pain in the wound that is not controlled with medicine. °· You have drainage, blood, or pus coming from the wound that lasts longer than 1 day. °· You develop muscle aches, chills, or a general ill feeling. °· You notice a bad smell coming from the wound or dressing. °· Your wound edges separate after the sutures, staples, or skin adhesive strips have been removed. °· You develop persistent nausea or vomiting. °SEEK IMMEDIATE MEDICAL CARE IF:  °· You have a fever. °· You develop a rash. °· You develop dizzy episodes or faint while standing. °· You have difficulty breathing. °· You develop any reaction or side effects to medicine given. °MAKE SURE YOU:  °· Understand these instructions. °· Will watch your condition. °· Will get help  right away if you are not doing well or get worse. °Document Released: 11/26/2004 Document Revised: 08/01/2011 Document Reviewed: 07/03/2013 °Exit[employer]® Patient Information ©2015 [employer]. This information is not intended to replace advice given to you by your health care provider. Make sure you discuss any questions you have with your health care provider. ° ° °

## 2014-12-31 NOTE — ED Provider Notes (Signed)
CSN: [id number]     Arrival date & time 12/31/14  2129 History  This chart was scribed for non-physician practitioner, Otelia Santee, PA-C working with Deno Etienne, DO by Rayna Sexton, ED scribe. This patient was seen in room WTR7/WTR7 and the patient's care was started at 10:39 PM.   Chief Complaint  Patient presents with  . Wound Check   The history is provided by the patient. No language interpreter was used.    HPI Comments: Margaret Cameron is a 34 y.o. female who presents to the Emergency Department for a wound follow up s/p an I&D that was performed at [facility] on 12/30/2014. Pt notes that she received an I&D to her left axilla at [facility] and came back for a reevaluation as well as to have the packing removed. Pt notes a Hx of abscesses to both the affected region as well as reoccurring abscesses to other regions. She denies any other associated symptoms.   Past Medical History  Diagnosis Date  . Pyelonephritis     x 3 episodes  . Abnormal Pap smear of cervix     LEEP 2011  . Hypoglycemia   . Anemia     with pregnancy  . Chronic headaches   . Cyst near coccyx   . Pilonidal cyst   . Sickle cell trait    Past Surgical History  Procedure Laterality Date  . Dilation and curettage of uterus  2008  . Leep  2011  . Other surgical history      Pynlodial cyst removed from tail bone 2012  . Other surgical history  10/2010    removal of cyst near co[facility]xyx  . Pilonidal cyst excision  10/2010   Family History  Problem Relation Age of Onset  . Hypertension Brother   . Cancer Maternal Aunt     breast   Social History  Substance Use Topics  . Smoking status: Former Smoker -- 0.10 packs/day    Types: Cigarettes    Quit date: 09/12/2014  . Smokeless tobacco: Never Used  . Alcohol Use: No   OB History    Gravida Para Term Preterm AB TAB SAB Ectopic Multiple Living   5 2 2  0 3 1 2  0 0 2     Review of Systems  Constitutional: Negative for fever and chills.  Skin: Positive  for color change and wound.   Allergies  Codeine and Septra  Home Medications   Prior to Admission medications   Medication Sig Start Date End Date Taking? Authorizing Provider  acetaminophen (TYLENOL) 500 MG tablet Take 1,500 mg by mouth every 6 (six) hours as needed (for pain.).    Historical Provider, MD  ondansetron (ZOFRAN) 4 MG tablet Take 1 tablet (4 mg total) by mouth every 8 (eight) hours as needed for nausea or vomiting. Patient not taking: Reported on 10/30/2014 07/12/14   Dahlia Bailiff, PA-C  oxyCODONE-acetaminophen (PERCOCET/ROXICET) 5-325 MG per tablet Take 1 tablet by mouth every 6 (six) hours as needed for severe pain. 12/30/14   Dalia Heading, PA-C  predniSONE (DELTASONE) 20 MG tablet Take 2 tablets (40 mg total) by mouth daily. 10/30/14   Blanchie Dessert, MD   BP 120/67 mmHg  Pulse 76  Temp(Src) 98.4 F (36.9 C) (Oral)  Resp 18  SpO2 99%  LMP 12/10/2014 (Exact Date) Physical Exam  Constitutional: She is oriented to person, place, and time. She appears well-developed and well-nourished. No distress.  HENT:  Head: Normocephalic and atraumatic.  Mouth/Throat: Oropharynx is  clear and moist.  Eyes: Conjunctivae and EOM are normal. Pupils are equal, round, and reactive to light.  Neck: Normal range of motion. Neck supple. No tracheal deviation present.  Cardiovascular: Normal rate.   Pulmonary/Chest: Effort normal.  Abdominal: Soft. There is no tenderness.  Musculoskeletal: Normal range of motion.  Left axilla has incised abscess with packing present. No redness. Moderately tender, subjectively improved.   Neurological: She is alert and oriented to person, place, and time.  Skin: Skin is warm and dry.  Psychiatric: She has a normal mood and affect. Her behavior is normal.  Nursing note and vitals reviewed.   ED Course  Procedures  DIAGNOSTIC STUDIES: Oxygen Saturation is 99% on RA, normal by my interpretation.    COORDINATION OF CARE: 10:42 PM Discussed treatment  plan with pt at bedside and pt agreed to plan.  Labs Review Labs Reviewed - No data to display  Imaging Review No results found.   EKG Interpretation None      MDM   Final diagnoses:  None    1. Abscess recheck  Packing removed. Will refer to [facility] for evaluation of recurrent abscesses.   I personally performed the services described in this documentation, which was scribed in my presence. The recorded information has been reviewed and is accurate.     Charlann Lange, PA-C 12/31/14 [place], DO 12/31/14 2304

## 2014-12-31 NOTE — ED Notes (Signed)
Pt had an I&D of an abscess on Monday and was told to return in a couple of days to have packing removed and area checked

## 2015-01-05 ENCOUNTER — Encounter (HOSPITAL_COMMUNITY): Payer: Self-pay

## 2015-01-05 ENCOUNTER — Emergency Department (HOSPITAL_COMMUNITY)
Admission: EM | Admit: 2015-01-05 | Discharge: 2015-01-05 | Disposition: A | Discharge status: Home / Self Care | Payer: Self-pay | Attending: Emergency Medicine | Admitting: Emergency Medicine

## 2015-01-05 DIAGNOSIS — Z87891 Personal history of nicotine dependence: Secondary | ICD-10-CM | POA: Insufficient documentation

## 2015-01-05 DIAGNOSIS — G8929 Other chronic pain: Secondary | ICD-10-CM | POA: Insufficient documentation

## 2015-01-05 DIAGNOSIS — Z4801 Encounter for change or removal of surgical wound dressing: Secondary | ICD-10-CM | POA: Insufficient documentation

## 2015-01-05 DIAGNOSIS — Z862 Personal history of diseases of the blood and blood-forming organs and certain disorders involving the immune mechanism: Secondary | ICD-10-CM | POA: Insufficient documentation

## 2015-01-05 DIAGNOSIS — Z8639 Personal history of other endocrine, nutritional and metabolic disease: Secondary | ICD-10-CM | POA: Insufficient documentation

## 2015-01-05 DIAGNOSIS — Z872 Personal history of diseases of the skin and subcutaneous tissue: Secondary | ICD-10-CM | POA: Insufficient documentation

## 2015-01-05 DIAGNOSIS — L0291 Cutaneous abscess, unspecified: Secondary | ICD-10-CM

## 2015-01-05 DIAGNOSIS — Z87448 Personal history of other diseases of urinary system: Secondary | ICD-10-CM | POA: Insufficient documentation

## 2015-01-05 NOTE — Discharge Instructions (Signed)
Abscess Care After An abscess (also called a boil or furuncle) is an infected area that contains a collection of pus. Signs and symptoms of an abscess include pain, tenderness, redness, or hardness, or you may feel a moveable soft area under your skin. An abscess can occur anywhere in the body. The infection may spread to surrounding tissues causing cellulitis. A cut (incision) by the surgeon was made over your abscess and the pus was drained out. Gauze may have been packed into the space to provide a drain that will allow the cavity to heal from the inside outwards. The boil may be painful for 5 to 7 days. Most people with a boil do not have high fevers. Your abscess, if seen early, may not have localized, and may not have been lanced. If not, another appointment may be required for this if it does not get better on its own or with medications. HOME CARE INSTRUCTIONS   Only take over-the-counter or prescription medicines for pain, discomfort, or fever as directed by your caregiver.  When you bathe, soak and then remove gauze or iodoform packs at least daily or as directed by your caregiver. You may then wash the wound gently with mild soapy water. Repack with gauze or do as your caregiver directs. SEEK IMMEDIATE MEDICAL CARE IF:   You develop increased pain, swelling, redness, drainage, or bleeding in the wound site.  You develop signs of generalized infection including muscle aches, chills, fever, or a general ill feeling.  An oral temperature above 102 F (38.9 C) develops, not controlled by medication. See your caregiver for a recheck if you develop any of the symptoms described above. If medications (antibiotics) were prescribed, take them as directed. Document Released: 11/25/2004 Document Revised: 08/01/2011 Document Reviewed: 07/23/2007 [facility] Patient Information 2015 [place], [place]. This information is not intended to replace advice given to you by your health care provider. Make sure  you discuss any questions you have with your health care provider.   Emergency Department Resource Guide 1) Find a Doctor and Pay Out of Pocket Although you won't have to find out who is covered by your insurance plan, it is a good idea to ask around and get recommendations. You will then need to call the office and see if the doctor you have chosen will accept you as a new patient and what types of options they offer for patients who are self-pay. Some doctors offer discounts or will set up payment plans for their patients who do not have insurance, but you will need to ask so you aren't surprised when you get to your appointment.  2) Contact Your Local Health Department Not all health departments have doctors that can see patients for sick visits, but many do, so it is worth a call to see if yours does. If you don't know where your local health department is, you can check in your phone book. The CDC also has a tool to help you locate your state's health department, and many state websites also have listings of all of their local health departments.  3) Find a [facility] If your illness is not likely to be very severe or complicated, you may want to try a walk in clinic. These are popping up all over the country in pharmacies, drugstores, and shopping centers. They're usually staffed by nurse practitioners or physician assistants that have been trained to treat common illnesses and complaints. They're usually fairly quick and inexpensive. However, if you have serious medical issues  or chronic medical problems, these are probably not your best option.  No Primary Care Doctor: - Call Health Connect at  [phone] - they can help you locate a primary care doctor that  accepts your insurance, provides certain services, etc. - Physician Referral Service- [phone]  Chronic Pain Problems: Organization         Address  Phone   Notes  [facility]  [phone] Patients need  to be referred by their primary care doctor.   Medication Assistance: Organization         Address  Phone   Notes  [facility] Medication [facility] [address] [place], [place] [address] [phone] --Must be a resident of [facility] -- Must have NO insurance coverage whatsoever (no Medicaid/ Medicare, etc.) -- The pt. MUST have a primary care doctor that directs their care regularly and follows them in the community   MedAssist  [phone]   [employer]  [phone]    Agencies that provide inexpensive medical care: Organization         Address  Phone   Notes  [place]  [phone]   [facility]    [phone]   [facility] [place], [place] [address] [phone]   [place] [address] [place] [phone]   Planned Parenthood    [phone]   [facility]    [phone]   [place] and [address], [place] Phone:  [phone], Fax:  [phone] Hours of Operation:  9 am - 6 pm, M-F.  Also accepts Medicaid/Medicare and self-pay.  [facility] for [place], [address], [place] Phone: [phone], Fax: [phone]. Hours of Operation:  8:30 am - 5:30 pm, M-F.  Also accepts Medicaid and self-pay.  [facility] [place] [address], [place] Phone: [phone]   [place], [place], [place] [phone], Ext. [phone] Mondays & Thursdays: 7-9 AM.  First 15 patients are seen on a first come, first serve basis.    [place] Providers:  Organization         Address  Phone   Notes  [facility] [address], [place] [phone] Also accepts self-pay patients.  [facility] [address], [place]  [phone]   [place], [address], [place] [phone]   [facility] [address], [place] [phone]   Lucianne Lei [address], [place]   [phone] Only accepts [place] Access [place] patients after they have their name applied to their card.   Self-Pay (no insurance) in [facility]:  Organization         Address  Phone   Notes  Sickle Cell Patients, [facility] [place] [phone]   [facility] Urgent Care [place] [phone]   [facility] [place]  [place], [place], [place] [phone]   [facility]/Dr. Osei-Bonsu  [address] [place] or [address], [place] [phone] Phone number for both [place] and [place] locations is the same.  [facility] [address] [place] [phone]   [facility] [address] [place] or [address] [phone]   [facility] [address] [place] [phone], phone; [phone], fax Sees patients 1st and 3rd Saturday of every month.  Must not qualify for public or private insurance (i.e. Medicaid, Medicare, [place] Health Choice, Veterans' Benefits)  Household income should be no more than 200% of the poverty level The clinic cannot treat you if you are pregnant or think you are pregnant  Sexually transmitted diseases are not treated at the clinic.    Dental Care: Organization         Address  Phone  Notes  [facility] Department of [facility] [place] [phone] Accepts children up to age 48 who are enrolled in [place] or [facility]; pregnant women with a Medicaid card; and children who have applied for Medicaid or [place] Health Choice, but were declined, whose  parents can pay a reduced fee at time of service.  [facility] Department of [facility]  [address], [place] [phone] Accepts children up to age 56 who are enrolled in [place] or [place]; pregnant women with a Medicaid card; and children who have applied for Medicaid or [place] Health Choice, but were declined, whose parents can pay a reduced fee at time of service.  [place] Adult Dental Access PROGRAM  [place] [phone] Patients are seen by appointment only. Walk-ins are not accepted. [place] will see patients 80 years of age and older. Monday - Tuesday (8am-5pm) Most Wednesdays (8:30-5pm) $30 per visit, cash only  [facility] Adult Dental Access PROGRAM  [address], [facility] [phone] Patients are seen by appointment only. Walk-ins are not accepted. [place] will see patients 73 years of age and older. One Wednesday Evening (Monthly: Volunteer Based).  $30 per visit, cash only  [place]  [phone] for adults; Children under age 23, call Graduate Pediatric Dentistry at [phone]. Children aged 63-14, please call [phone] to request a pediatric application.  Dental services are provided in all areas of dental care including fillings, crowns and bridges, complete and partial dentures, implants, gum treatment, root canals, and extractions. Preventive care is also provided. Treatment is provided to both adults and children. Patients are selected via a lottery and there is often a waiting list.   [facility] [address] [place]  [phone] [email or web address]   [facility] [address] [place], [place] [phone], Ext. [phone] Second and Fourth Thursday of each month, opens at 6:30 AM; Clinic ends at 9 AM.  Patients are seen on a first-come first-served basis, and a limited number are seen during each clinic.   [facility]   [address] Hillard Danker [place], [place] [phone]   Eligibility Requirements You must have lived in [place], [place], or [place] counties for at least the last three months.   You cannot be eligible for state or federal sponsored [employer], including [employer], [place], or [employer].   You generally cannot be eligible for healthcare insurance through your employer.    How to apply: Eligibility screenings are held every Tuesday and Wednesday afternoon from 1:00 pm until 4:00 pm. You do not need an appointment for the interview!  [facility] [address] [place], [place]   [place]  [place] Department  [place]  [phone]    Behavioral Health Resources in the Community: Intensive Outpatient Programs Organization         Address  Phone  Notes  [place]. [address], [place], [place] [phone]   [facility] Outpatient [address] [place], [place]   ADS: Alcohol & Drug Svcs [address], [place], [place]   [place] [address]  [place], [place] or [phone]   Substance Abuse Resources Organization         Address  Phone  Notes  Alcohol and Drug Services  [phone]   [place]  [phone]   The [place]   Chinita Pester  [phone]   Residential & Outpatient Substance Abuse Program  [phone]   Psychological Services Organization         Address  Phone  Notes  [facility] [place]  [place]  [phone]   [place] [address] [place] [address], [place] or [phone]    Mobile Crisis Teams Organization         Address  Phone  Notes  [facility], Mobile Crisis Care Unit  [phone]     [facility]ive Psychotherapeutic Services  [address] [place], [place]   [place] [address], [place] [phone]    Self-Help/Support Groups Organization         Address  Phone             Notes  [place]. of [place] - variety of support groups  [place] Call for more information  Narcotics Anonymous (NA), [facility] [address], [employer] [place]  2 meetings at this location   Special educational needs teacher         Address  Phone  Notes  [facility]AP Residential Treatment [place],    [place]  [phone]   [facility]  [address] [place] [address], [place], [place]   [place], [place] [phone] Admissions: 8am-3pm M-F  Incentives Substance [place] [address]    [place], [place] [phone]   [facility] [address] [place], [place], [place]   The [facility] [address],  [place], [place]   [facility]ight Programs - Intensive Outpatient [address] Kristeen [address], [place], [place]   [facility] ([place].) [place].,  [place], [place] [phone] or [phone]   [facility]idential Treatment Services (RTS) [address], [place], [place] Accepts Medicaid  [facility] [place] [address],  [place] [phone] Substance Abuse/Addiction Treatment   [facility] Organization         Address  Phone  Notes  [facility]  [phone]   Domenic Schwab, PhD [address] Arlis Porta [place], [place]   [phone] or [phone]   [facility] [address] [place], [place] [phone]   [place] [address] [place], [place] [phone] Insurance/Medicaid/sponsorship through [employer] and Families [address], [place] [address]  [place], [place] [phone] [place], [place] [phone]    Dr. Adele Schilder  [phone]   [facility] Dept. 1) [address], [place] 2) [place] 3)  [place] [address], [place] [phone]  [phone]   [place] [phone] or [phone] (After Hours)     Please follow-up with primary care for further evaluation and management. Please monitor for new or worsening signs or symptoms, return immediately if any present.

## 2015-01-05 NOTE — ED Provider Notes (Signed)
CSN: [id number]     Arrival date & time 01/05/15  5638 History   First MD Initiated Contact with Patient 01/05/15 1012     Chief Complaint  Patient presents with  . Abscess   HPI   34 year old female presents today for reevaluation of the abscess status post I&D on 12/30/2014. Patient reports that she came in for reevaluation 5 days ago and was instructed to return 1 more time. Abscesses in the left axilla, she reports some tenderness with movement of the arm, denies any signs of infection or discharge. Patient denies fever, chills, nausea, vomiting, dizziness, or any other concerning signs or symptoms today.  Past Medical History  Diagnosis Date  . Pyelonephritis     x 3 episodes  . Abnormal Pap smear of cervix     LEEP 2011  . Hypoglycemia   . Anemia     with pregnancy  . Chronic headaches   . Cyst near coccyx   . Pilonidal cyst   . Sickle cell trait    Past Surgical History  Procedure Laterality Date  . Dilation and curettage of uterus  2008  . Leep  2011  . Other surgical history      Pynlodial cyst removed from tail bone 2012  . Other surgical history  10/2010    removal of cyst near coxxyx  . Pilonidal cyst excision  10/2010   Family History  Problem Relation Age of Onset  . Hypertension Brother   . Cancer Maternal Aunt     breast   Social History  Substance Use Topics  . Smoking status: Former Smoker -- 0.10 packs/day    Types: Cigarettes    Quit date: 09/12/2014  . Smokeless tobacco: Never Used  . Alcohol Use: No   OB History    Gravida Para Term Preterm AB TAB SAB Ectopic Multiple Living   5 2 2  0 3 1 2  0 0 2     Review of Systems  All other systems reviewed and are negative.     Allergies  Codeine and Septra  Home Medications   Prior to Admission medications   Medication Sig Start Date End Date Taking? Authorizing Provider  acetaminophen (TYLENOL) 500 MG tablet Take 1,500 mg by mouth every 8 (eight) hours as needed for mild pain, moderate  pain, fever or headache (for pain.).    Yes Historical Provider, MD  ondansetron (ZOFRAN) 4 MG tablet Take 1 tablet (4 mg total) by mouth every 8 (eight) hours as needed for nausea or vomiting. Patient not taking: Reported on 10/30/2014 07/12/14   Dahlia Bailiff, PA-C  oxyCODONE-acetaminophen (PERCOCET/ROXICET) 5-325 MG per tablet Take 1 tablet by mouth every 6 (six) hours as needed for severe pain. Patient not taking: Reported on 01/05/2015 12/30/14   Dalia Heading, PA-C  predniSONE (DELTASONE) 20 MG tablet Take 2 tablets (40 mg total) by mouth daily. Patient not taking: Reported on 01/05/2015 10/30/14   Blanchie Dessert, MD   BP 119/58 mmHg  Pulse 75  Temp(Src) 97.9 F (36.6 C) (Oral)  Resp 18  SpO2 100%  LMP 12/10/2014 (Exact Date)   Physical Exam  Constitutional: She is oriented to person, place, and time. She appears well-developed and well-nourished.  HENT:  Head: Normocephalic and atraumatic.  Eyes: Conjunctivae are normal. Pupils are equal, round, and reactive to light. Right eye exhibits no discharge. Left eye exhibits no discharge. No scleral icterus.  Neck: Normal range of motion. No JVD present. No tracheal deviation present.  Pulmonary/Chest: Effort  normal. No stridor.  Neurological: She is alert and oriented to person, place, and time. Coordination normal.  Skin:  Post I&D incision healing well no signs of discharge, surrounding swelling, redness, warmth. Full active range of motion of the shoulder.  Psychiatric: She has a normal mood and affect. Her behavior is normal. Judgment and thought content normal.  Nursing note and vitals reviewed.   ED Course  Procedures (including critical care time) Labs Review Labs Reviewed - No data to display  Imaging Review No results found. I, Jesenya Bowditch Todd, personally reviewed and evaluated these images and lab results as part of my medical decision-making.   EKG Interpretation None      MDM   Final diagnoses:  Abscess     Labs:  Imaging:  Consults:  Therapeutics:  Discharge Meds:   Assessment/Plan: Patient presents for recheck for abscess, no signs of infection, healing well. Strict return precautions given.         Okey Regal, PA-C 01/07/15 [place], MD 01/09/15 [phone]

## 2015-01-05 NOTE — ED Notes (Signed)
Pt had abscess lanced on Monday here.  Packing was placed. We removed packing on Friday.  Wound continues to drain.  Pt did have a fever on Saturday.  No fever since.  Pt wanted to go back to work today.  However, pt unsure if able to do job.  Job requires work note and clearance for return.  Pain has improved but does continue.

## 2015-03-06 ENCOUNTER — Emergency Department (HOSPITAL_COMMUNITY)
Admission: EM | Admit: 2015-03-06 | Discharge: 2015-03-06 | Disposition: A | Discharge status: Home / Self Care | Payer: Non-veteran care | Attending: Emergency Medicine | Admitting: Emergency Medicine

## 2015-03-06 ENCOUNTER — Encounter (HOSPITAL_COMMUNITY): Payer: Self-pay | Admitting: *Deleted

## 2015-03-06 DIAGNOSIS — Z87891 Personal history of nicotine dependence: Secondary | ICD-10-CM | POA: Insufficient documentation

## 2015-03-06 DIAGNOSIS — M7711 Lateral epicondylitis, right elbow: Secondary | ICD-10-CM | POA: Diagnosis not present

## 2015-03-06 DIAGNOSIS — Z872 Personal history of diseases of the skin and subcutaneous tissue: Secondary | ICD-10-CM | POA: Diagnosis not present

## 2015-03-06 DIAGNOSIS — M25521 Pain in right elbow: Secondary | ICD-10-CM | POA: Diagnosis present

## 2015-03-06 DIAGNOSIS — M7651 Patellar tendinitis, right knee: Secondary | ICD-10-CM | POA: Diagnosis not present

## 2015-03-06 DIAGNOSIS — G8929 Other chronic pain: Secondary | ICD-10-CM | POA: Insufficient documentation

## 2015-03-06 DIAGNOSIS — Z862 Personal history of diseases of the blood and blood-forming organs and certain disorders involving the immune mechanism: Secondary | ICD-10-CM | POA: Diagnosis not present

## 2015-03-06 DIAGNOSIS — IMO0002 Reserved for concepts with insufficient information to code with codable children: Secondary | ICD-10-CM

## 2015-03-06 MED ORDER — IBUPROFEN 600 MG PO TABS: 600.0000 mg | ORAL_TABLET | Freq: Four times a day (QID) | ORAL | Status: DC | PRN

## 2015-03-06 NOTE — ED Notes (Signed)
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 Pt has pain in RT elbow and RT knee for one month.

## 2015-03-06 NOTE — ED Notes (Signed)
Declined W/C at D/C and was escorted to lobby by RN. 

## 2015-03-06 NOTE — Discharge Instructions (Signed)
Patellar Tendinitis With Rehab Tendinitis is inflammation of a tendon. Tendonitis of the tendon below the kneecap (patella) is known as patellar tendonitis. Patellar tendonitis is also called jumper's knee. Jumper's knee is a common cause of pain below the kneecap (infrapatellar). Jumper's knee may involve a tear (strain) in the ligament. Strains are classified into three categories. Grade 1 strains cause pain, but the tendon is not lengthened. Grade 2 strains include a lengthened ligament, due to the ligament being stretched or partially ruptured. With grade 2 strains there is still function, although function may be decreased. Grade 3 strains involve a complete tear of the tendon or muscle, and function is usually impaired. Patellar tendon strains are usually grade 1 or 2.  SYMPTOMS   Pain, tenderness, swelling, warmth, or redness over the patellar tendon (just below the kneecap).  Pain and loss of strength (sometimes), with forcefully straightening the knee (especially when jumping or rising from a seated or squatting position), or bending the knee completely (squatting or kneeling).  Crackling sound (crepitation) when the tendon is moved or touched. CAUSES  Patellar tendonitis is caused by injury to the patellar tendon. The inflammation is the body's healing response. Common causes of injury include:  Stress from a sudden increase in intensity, frequency, or duration of training.  Overuse of the thigh muscles (quadriceps) and patellar tendon.  Direct hit (trauma) to the knee or patellar tendon. RISK INCREASES WITH:  Sports that require sudden, explosive quadriceps contraction, such as jumping, quick starts, or kicking.  Running sports, especially running down hills.  Poor strength and flexibility of the thigh and knee.  Flat feet. PREVENTION  Warm up and stretch properly before activity.  Allow for adequate recovery between workouts.  Maintain physical fitness:  Strength,  flexibility, and endurance.  Cardiovascular fitness.  Protect the knee joint with taping, protective strapping, bracing, or elastic compression bandage.  Wear arch supports (orthotics). PROGNOSIS  If treated properly, patellar tendonitis usually heals within 6 weeks.  RELATED COMPLICATIONS   Longer healing time if not properly treated or if not given enough time to heal.  Recurring symptoms if activity is resumed too soon, with overuse, with a direct blow, or when using poor technique.  If untreated, tendon rupture requiring surgery. TREATMENT Treatment first involves the use of ice and medicine to reduce pain and inflammation. The use of strengthening and stretching exercises may help reduce pain with activity. These exercises may be performed at home or with a therapist. Serious cases of tendonitis may require restraining the knee for 10 to 14 days to prevent stress on the tendon and to promote healing. Crutches may be used (uncommon) until you can walk without a limp. For cases in which nonsurgical treatment is unsuccessful, surgery may be advised to remove the inflamed tendon lining (sheath). Surgery is rare, and is only advised after at least 6 months of nonsurgical treatment. MEDICATION   If pain medicine is needed, nonsteroidal anti-inflammatory medicines (aspirin and ibuprofen), or other minor pain relievers (acetaminophen), are often advised.  Do not take pain medicine for 7 days before surgery.  Prescription pain relievers may be given if your caregiver thinks they are needed. Use only as directed and only as much as you need. HEAT AND COLD  Cold treatment (icing) should be applied for 10 to 15 minutes every 2 to 3 hours for inflammation and pain, and immediately after activity that aggravates your symptoms. Use ice packs or an ice massage.  Heat treatment may be used before  performing stretching and strengthening activities prescribed by your caregiver, physical therapist, or  athletic trainer. Use a heat pack or a warm water soak. SEEK MEDICAL CARE IF:  Symptoms get worse or do not improve in 2 weeks, despite treatment.  New, unexplained symptoms develop. (Drugs used in treatment may produce side effects.) EXERCISES RANGE OF MOTION (ROM) AND STRETCHING EXERCISES - Patellar Tendinitis (Jumper's Knee) These are some of the initial exercises with which you may start your rehabilitation program, until you see your caregiver again or until your symptoms are resolved. Remember:   Flexible tissue is more tolerant of the stresses placed on it during activities.  Each stretch should be held for 20 to 30 seconds.  A gentle stretching sensation should be felt. STRETCH - Hamstrings, Supine  Lie on your back. Loop a belt or towel over the ball of your right / left foot.  Straighten your right / left knee and slowly pull on the belt to raise your leg. Do not allow the right / left knee to bend. Keep your opposite leg flat on the floor.  Raise the leg until you feel a gentle stretch behind your right / left knee or thigh. Hold this position for __________ seconds. Repeat __________ times. Complete this stretch __________ times per day.  STRETCH - Hamstrings, Doorway  Lie on your back with your right / left leg extended and resting on the wall, and the opposite leg flat on the ground through the door. At first, position your bottom farther away from the wall.  Keep your right / left knee straight. If you feel a stretch behind your knee or thigh, hold this position for __________ seconds.  If you do not feel a stretch, scoot your bottom closer to the door, and hold __________ seconds. Repeat __________ times. Complete this stretch __________ times per day.  STRETCH - Hamstrings, Standing  Stand or sit and extend your right / left leg, placing your foot on a chair or foot stool.  Keep a slight arch in your low back and your hips straight forward.  Lead with your chest  and lean forward at the waist until you feel a gentle stretch in the back of your right / left knee or thigh. (When done correctly, this exercise requires leaning only a small distance.)  Hold this position for __________ seconds. Repeat __________ times. Complete this stretch __________ times per day. STRETCH - Adductors, Lunge  While standing, spread your legs, with your right / left leg behind you.  Lean away from your right / left leg by bending your opposite knee. You may rest your hands on your thigh for balance.  You should feel a stretch in your right / left inner thigh. Hold for __________ seconds. Repeat __________ times. Complete this exercise __________ times per day.  STRENGTHENING EXERCISES - Patellar Tendinitis (Jumper's Knee) These exercises may help you when beginning to rehabilitate your injury. They may resolve your symptoms with or without further involvement from your physician, physical therapist or athletic trainer. While completing these exercises, remember:   Muscles can gain both the endurance and the strength needed for everyday activities through controlled exercises.  Complete these exercises as instructed by your physician, physical therapist or athletic trainer. Increase the resistance and repetitions only as guided by your caregiver. STRENGTH - Quadriceps, Isometrics  Lie on your back with your right / left leg extended and your opposite knee bent.  Gradually tense the muscles in the front of your right /  left thigh. You should see either your kneecap slide up toward your hip or increased dimpling just above the knee. This motion will push the back of the knee down toward the floor, mat, or bed on which you are lying.  Hold the muscle as tight as you can, without increasing your pain, for __________ seconds.  Relax the muscles slowly and completely in between each repetition. Repeat __________ times. Complete this exercise __________ times per day.    STRENGTH - Quadriceps, Short Arcs  Lie on your back. Place a __________ inch towel roll under your right / left knee, so that the knee bends slightly.  Raise only your lower leg by tightening the muscles in the front of your thigh. Do not allow your thigh to rise.  Hold this position for __________ seconds. Repeat __________ times. Complete this exercise __________ times per day.  OPTIONAL ANKLE WEIGHTS: Begin with ____________________, but DO NOT exceed ____________________. Increase in 1 pound/ 0.5 kilogram increments. STRENGTH - Quadriceps, Straight Leg Raises  Quality counts! Watch for signs that the quadriceps muscle is working, to be sure you are strengthening the correct muscles and not "cheating" by substituting with healthier muscles.  Lay on your back with your right / left leg extended and your opposite knee bent.  Tense the muscles in the front of your right / left thigh. You should see either your kneecap slide up or increased dimpling just above the knee. Your thigh may even shake a bit.  Tighten these muscles even more and raise your leg 4 to 6 inches off the floor. Hold for __________ seconds.  Keeping these muscles tense, lower your leg.  Relax the muscles slowly and completely between each repetition. Repeat __________ times. Complete this exercise __________ times per day.  STRENGTH - Quadriceps, Squats  Stand in a door frame so that your feet and knees are in line with the frame.  Use your hands for balance, not support, on the frame.  Slowly lower your weight, bending at the hips and knees. Keep your lower legs upright so that they are parallel with the door frame. Squat only within the range that does not increase your knee pain. Never let your hips drop below your knees.  Slowly return upright, pushing with your legs, not pulling with your hands. Repeat __________ times. Complete this exercise __________ times per day.  STRENGTH - Quadriceps,  Step-Downs  Stand on the edge of a step stool or stair. Be prepared to use a countertop or wall for balance, if needed.  Keeping your right / left knee directly over the middle of your foot, slowly touch your opposite heel to the floor or lower step. Do not go all the way to the floor if your knee pain increases; just go as far as you can without increased discomfort. Use your right / left leg muscles, not gravity to lower your body weight.  Slowly push your body weight back up to the starting position. Repeat __________ times. Complete this exercise __________ times per day.    This information is not intended to replace advice given to you by your health care provider. Make sure you discuss any questions you have with your health care provider.   Document Released: 05/09/2005 Document Revised: 09/23/2014 Document Reviewed: 08/21/2008 Elsevier Interactive Patient Education 2016 [employer].  Please read attached information. If you experience any new or worsening signs or symptoms please return to the emergency room for evaluation. Please follow-up with your primary care provider  or specialist as discussed. Please use medication prescribed only as directed and discontinue taking if you have any concerning signs or symptoms.

## 2015-03-06 NOTE — ED Provider Notes (Signed)
CSN: [id number]     Arrival date & time 03/06/15  2706 History   None    Chief Complaint  Patient presents with  . Knee Pain  . Elbow Pain   HPI   34 year old female presents today with numerous complaints. Patient reports that over the last months she's had both right elbow and right knee pain. 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 She reports slow onset of right elbow pain worse with pronation, located on the lateral aspect of the right extremity. She denies any swelling, edema, tenderness to palpation. She denies any loss of distal sensation strength or function, full active range of motion of the elbow shoulder and wrist, grip strength maintained. Patient also reports right anterior knee pain. She reports this is worse with walking or heavy lifting, located around the patellar tendon, no swelling, edema, redness, or pain with deep flexion. 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 Patient denies any fever, chills, nausea, vomiting, or any other concerning signs or symptoms at this time. Patient has used icy hot on her elbow with little relief, has not tried ibuprofen and Tylenol .   Past Medical History  Diagnosis Date  . Pyelonephritis     x 3 episodes  . Abnormal Pap smear of cervix     LEEP 2011  . Hypoglycemia   . Anemia     with pregnancy  . Chronic headaches   . Cyst near coccyx   . Pilonidal cyst   . Sickle cell trait [facility])    Past Surgical History  Procedure Laterality Date  . Dilation and curettage of uterus  2008  . Leep  2011  . Other surgical history      Pynlodial cyst removed from tail bone 2012  . Other surgical history  10/2010    removal of cyst near coxxyx  . Pilonidal cyst excision  10/2010   Family History  Problem Relation Age of Onset  . Hypertension Brother   . Cancer Maternal Aunt     breast   Social History  Substance Use Topics  . Smoking  status: Former Smoker -- 0.10 packs/day    Types: Cigarettes    Quit date: 09/12/2014  . Smokeless tobacco: Never Used  . Alcohol Use: No   OB History    Gravida Para Term Preterm AB TAB SAB Ectopic Multiple Living   5 2 2  0 3 1 2  0 0 2     Review of Systems  All other systems reviewed and are negative.   Allergies  Codeine and Septra  Home Medications   Prior to Admission medications   Medication Sig Start Date End Date Taking? Authorizing Provider  acetaminophen (TYLENOL) 500 MG tablet Take 1,500 mg by mouth every 8 (eight) hours as needed for mild pain, moderate pain, fever or headache (for pain.).     Historical Provider, MD  ibuprofen (ADVIL,MOTRIN) 600 MG tablet Take 1 tablet (600 mg total) by mouth every 6 (six) hours as needed. 03/06/15   Dellis Filbert Casha Estupinan, PA-C  ondansetron (ZOFRAN) 4 MG tablet Take 1 tablet (4 mg total) by mouth every 8 (eight) hours as needed for nausea or vomiting. Patient not taking: Reported on 10/30/2014 07/12/14   Dahlia Bailiff, PA-C  oxyCODONE-acetaminophen (PERCOCET/ROXICET) 5-325 MG per tablet Take 1 tablet by mouth every 6 (six) hours as needed for severe pain. Patient not taking: Reported  on 01/05/2015 12/30/14   Dalia Heading, PA-C  predniSONE (DELTASONE) 20 MG tablet Take 2 tablets (40 mg total) by mouth daily. Patient not taking: Reported on 01/05/2015 10/30/14   Blanchie Dessert, MD   BP 119/71 mmHg  Pulse 69  Temp(Src) 97.7 F (36.5 C) (Oral)  Resp 16  Ht 5\' 6"  (1.676 m)  Wt 164 lb (74.39 kg)  BMI 26.48 kg/m2  SpO2 100%  LMP 02/25/2015   Physical Exam  Constitutional: She is oriented to person, place, and time. She appears well-developed and well-nourished.  HENT:  Head: Normocephalic and atraumatic.  Eyes: Conjunctivae are normal. Pupils are equal, round, and reactive to light. Right eye exhibits no discharge. Left eye exhibits no discharge. No scleral icterus.  Neck: Normal range of motion. No JVD present. No tracheal deviation present.   Pulmonary/Chest: Effort normal. No stridor.  Musculoskeletal:  Bilateral exam of elbows show symmetrical, with no swelling, obvious deformity, redness. No warmth to touch, nontender to palpation, full active range of motion of the elbow. Lateral epicondyles pain with pronation, negative supination. Grip strength 5 out of 5 and sensation grossly intact. Knee symmetrical bilateral, no obvious signs of swelling, redness, obvious deformity. Minimally tender to palpation of the right patellar tendon, no warmth to touch, full active range of motion, pain with resisted extension of the knee. Negative posterior drawer, negative Lachman's, negative valgus varus, negative patellar grind.  Neurological: She is alert and oriented to person, place, and time. Coordination normal.  Psychiatric: She has a normal mood and affect. Her behavior is normal. Judgment and thought content normal.  Nursing note and vitals reviewed.   ED Course  Procedures (including critical care time) Labs Review Labs Reviewed - No data to display  Imaging Review No results found. I have personally reviewed and evaluated these images and lab results as part of my medical decision-making.   EKG Interpretation None      MDM   Final diagnoses:  Epicondylitis  Patellar tendinitis of right knee    Labs:  Imaging:  Consults:  Therapeutics:  Discharge Meds: Ibuprofen  Assessment/Plan: Patient presents with likely medial epicondylitis, patellar tendinitis. She has no signs of infection that would indicate septic joint, cellulitis, or any other concerning finding today. Nontraumatic, patient has not tried Tylenol, ibuprofen, ice. She was instructed to use this therapies with the addition of tension relieving band on the knee. Patient's instructed follow-up with her primary care provider in one week if current therapies do not improve symptoms. Patient verbalized understanding and agreement for today's plan and no further  questions concerns.        Okey Regal, PA-C 03/06/15 [id number]  Leo Grosser, MD 03/08/15 [phone]

## 2015-03-26 ENCOUNTER — Encounter (HOSPITAL_COMMUNITY): Payer: Self-pay | Admitting: Emergency Medicine

## 2015-03-26 ENCOUNTER — Emergency Department (INDEPENDENT_AMBULATORY_CARE_PROVIDER_SITE_OTHER)
Admission: EM | Admit: 2015-03-26 | Discharge: 2015-03-26 | Disposition: A | Discharge status: Home / Self Care | Payer: Non-veteran care | Source: Home / Self Care | Attending: Family Medicine | Admitting: Family Medicine

## 2015-03-26 DIAGNOSIS — G43909 Migraine, unspecified, not intractable, without status migrainosus: Secondary | ICD-10-CM

## 2015-03-26 DIAGNOSIS — G44209 Tension-type headache, unspecified, not intractable: Secondary | ICD-10-CM | POA: Diagnosis not present

## 2015-03-26 MED ORDER — DIPHENHYDRAMINE HCL 50 MG/ML IJ SOLN
INTRAMUSCULAR | Status: AC
  Filled 2015-03-26: qty 1

## 2015-03-26 MED ORDER — ACETAMINOPHEN 325 MG PO TABS
ORAL_TABLET | ORAL | Status: AC
Start: 2015-03-26 — End: 2015-03-26
  Filled 2015-03-26: qty 2

## 2015-03-26 MED ORDER — DIPHENHYDRAMINE HCL 50 MG/ML IJ SOLN
50.0000 mg | Freq: Once | INTRAMUSCULAR | Status: AC
  Administered 2015-03-26: 50 mg via INTRAMUSCULAR

## 2015-03-26 MED ORDER — ACETAMINOPHEN 325 MG PO TABS
650.0000 mg | ORAL_TABLET | Freq: Once | ORAL | Status: AC
  Administered 2015-03-26: 650 mg via ORAL

## 2015-03-26 MED ORDER — KETOROLAC TROMETHAMINE 30 MG/ML IJ SOLN
30.0000 mg | Freq: Once | INTRAMUSCULAR | Status: AC
Start: 2015-03-26 — End: 2015-03-26
  Administered 2015-03-26: 30 mg via INTRAMUSCULAR

## 2015-03-26 MED ORDER — DEXAMETHASONE SODIUM PHOSPHATE 10 MG/ML IJ SOLN
10.0000 mg | Freq: Once | INTRAMUSCULAR | Status: AC
  Administered 2015-03-26: 10 mg via INTRAMUSCULAR

## 2015-03-26 MED ORDER — BUTALBITAL-APAP-CAFFEINE 50-325-40 MG PO TABS: 1.0000 | ORAL_TABLET | Freq: Four times a day (QID) | ORAL | Status: DC | PRN

## 2015-03-26 MED ORDER — DEXAMETHASONE SODIUM PHOSPHATE 10 MG/ML IJ SOLN
INTRAMUSCULAR | Status: AC
  Filled 2015-03-26: qty 1

## 2015-03-26 MED ORDER — KETOROLAC TROMETHAMINE 30 MG/ML IJ SOLN
INTRAMUSCULAR | Status: AC
  Filled 2015-03-26: qty 1

## 2015-03-26 NOTE — Discharge Instructions (Signed)
Recurrent Migraine Headache A migraine headache is an intense, throbbing pain on one or both sides of your head. Recurrent migraines keep coming back. A migraine can last for 30 minutes to several hours. CAUSES  The exact cause of a migraine headache is not always known. However, a migraine may be caused when nerves in the brain become irritated and release chemicals that cause inflammation. This causes pain. Certain things may also trigger migraines, such as:   Alcohol.  Smoking.  Stress.  Menstruation.  Aged cheeses.  Foods or drinks that contain nitrates, glutamate, aspartame, or tyramine.  Lack of sleep.  Chocolate.  Caffeine.  Hunger.  Physical exertion.  Fatigue.  Medicines used to treat chest pain (nitroglycerine), birth control pills, estrogen, and some blood pressure medicines. SYMPTOMS   Pain on one or both sides of your head.  Pulsating or throbbing pain.  Severe pain that prevents daily activities.  Pain that is aggravated by any physical activity.  Nausea, vomiting, or both.  Dizziness.  Pain with exposure to bright lights, loud noises, or activity.  General sensitivity to bright lights, loud noises, or smells. Before you get a migraine, you may get warning signs that a migraine is coming (aura). An aura may include:  Seeing flashing lights.  Seeing bright spots, halos, or zigzag lines.  Having tunnel vision or blurred vision.  Having feelings of numbness or tingling.  Having trouble talking.  Having muscle weakness. DIAGNOSIS  A recurrent migraine headache is often diagnosed based on:  Symptoms.  Physical examination.  A CT scan or MRI of your head. These imaging tests cannot diagnose migraines but can help rule out other causes of headaches.  TREATMENT  Medicines may be given for pain and nausea. Medicines can also be given to help prevent recurrent migraines. HOME CARE INSTRUCTIONS  Only take over-the-counter or prescription  medicines for pain or discomfort as directed by your health care provider. The use of long-term narcotics is not recommended.  Lie down in a dark, quiet room when you have a migraine.  Keep a journal to find out what may trigger your migraine headaches. For example, write down:  What you eat and drink.  How much sleep you get.  Any change to your diet or medicines.  Limit alcohol consumption.  Quit smoking if you smoke.  Get 7-9 hours of sleep, or as recommended by your health care provider.  Limit stress.  Keep lights dim if bright lights bother you and make your migraines worse. SEEK MEDICAL CARE IF:   You do not get relief from the medicines given to you.  You have a recurrence of pain.  You have a fever. SEEK IMMEDIATE MEDICAL CARE IF:  Your migraine becomes severe.  You have a stiff neck.  You have loss of vision.  You have muscular weakness or loss of muscle control.  You start losing your balance or have trouble walking.  You feel faint or pass out.  You have severe symptoms that are different from your first symptoms. MAKE SURE YOU:   Understand these instructions.  Will watch your condition.  Will get help right away if you are not doing well or get worse.   This information is not intended to replace advice given to you by your health care provider. Make sure you discuss any questions you have with your health care provider.   Document Released: 02/01/2001 Document Revised: 05/30/2014 Document Reviewed: 01/14/2013 Elsevier Interactive Patient Education 2016 Else[employer] Inc[employer]  Tension Headache A  tension headache is a feeling of pain, pressure, or aching that is often felt over the front and sides of the head. The pain can be dull, or it can feel tight (constricting). Tension headaches are not normally associated with nausea or vomiting, and they do not get worse with physical activity. Tension headaches can last from 30 minutes to several days. This is  the most common type of headache. CAUSES The exact cause of this condition is not known. Tension headaches often begin after stress, anxiety, or depression. Other triggers may include:  Alcohol.  Too much caffeine, or caffeine withdrawal.  Respiratory infections, such as colds, flu, or sinus infections.  Dental problems or teeth clenching.  Fatigue.  Holding your head and neck in the same position for a long period of time, such as while using a computer.  Smoking. SYMPTOMS Symptoms of this condition include:  A feeling of pressure around the head.  Dull, aching head pain.  Pain felt over the front and sides of the head.  Tenderness in the muscles of the head, neck, and shoulders. DIAGNOSIS This condition may be diagnosed based on your symptoms and a physical exam. Tests may be done, such as a CT scan or an MRI of your head. These tests may be done if your symptoms are severe or unusual. TREATMENT This condition may be treated with lifestyle changes and medicines to help relieve symptoms. HOME CARE INSTRUCTIONS Managing Pain  Take over-the-counter and prescription medicines only as told by your health care provider.  Lie down in a dark, quiet room when you have a headache.  If directed, apply ice to the head and neck area:  Put ice in a plastic bag.  Place a towel between your skin and the bag.  Leave the ice on for 20 minutes, 2-3 times per day.  Use a heating pad or a hot shower to apply heat to the head and neck area as told by your health care provider. Eating and Drinking  Eat meals on a regular schedule.  Limit alcohol use.  Decrease your caffeine intake, or stop using caffeine. General Instructions  Keep all follow-up visits as told by your health care provider. This is important.  Keep a headache journal to help find out what may trigger your headaches. For example, write down:  What you eat and drink.  How much sleep you get.  Any change to  your diet or medicines.  Try massage or other relaxation techniques.  Limit stress.  Sit up straight, and avoid tensing your muscles.  Do not use tobacco products, including cigarettes, chewing tobacco, or e-cigarettes. If you need help quitting, ask your health care provider.  Exercise regularly as told by your health care provider.  Get 7-9 hours of sleep, or the amount recommended by your health care provider. SEEK MEDICAL CARE IF:  Your symptoms are not helped by medicine.  You have a headache that is different from what you normally experience.  You have nausea or you vomit.  You have a fever. SEEK IMMEDIATE MEDICAL CARE IF:  Your headache becomes severe.  You have repeated vomiting.  You have a stiff neck.  You have a loss of vision.  You have problems with speech.  You have pain in your eye or ear.  You have muscular weakness or loss of muscle control.  You lose your balance or you have trouble walking.  You feel faint or you pass out.  You have confusion.   This information  is not intended to replace advice given to you by your health care provider. Make sure you discuss any questions you have with your health care provider.   Document Released: 05/09/2005 Document Revised: 01/28/2015 Document Reviewed: 09/01/2014 Elsevier Interactive Patient Education [employer].

## 2015-03-26 NOTE — ED Provider Notes (Signed)
CSN: [id number]     Arrival date & time 03/26/15  1520 History   First MD Initiated Contact with Patient 03/26/15 1658     Chief Complaint  Patient presents with  . Headache   (Consider location/radiation/quality/duration/timing/severity/associated sxs/prior Treatment) HPI Comments: 34 year old female complaining of a severe headache for 3 days. Headache is located primarily across the frontal and right temple areas. The pain is also located behind the eyes. She describes the pain as sharp and pounding. The pounding is primarily behind the right eye and right temple. Associated symptoms include photophobia and nasal drainage. She has a history of chronic headaches, approximately one per month. The character and location of the past headaches are similar to that of today however today's headache is much worse than any other headache she has had before. The headache is constant day and night. She has taken OTC medications without relief. She denies problems with vision, speech, hearing, swallowing, focal paresthesias or weakness, loss in control of bladder or bowel. No problems with confusion, cognition or memory. No history of trauma.    Past Medical History  Diagnosis Date  . Pyelonephritis     x 3 episodes  . Abnormal Pap smear of cervix     LEEP 2011  . Hypoglycemia   . Anemia     with pregnancy  . Chronic headaches   . Cyst near coccyx   . Pilonidal cyst   . Sickle cell trait [facility])    Past Surgical History  Procedure Laterality Date  . Dilation and curettage of uterus  2008  . Leep  2011  . Other surgical history      Pynlodial cyst removed from tail bone 2012  . Other surgical history  10/2010    removal of cyst near coxxyx  . Pilonidal cyst excision  10/2010   Family History  Problem Relation Age of Onset  . Hypertension Brother   . Cancer Maternal Aunt     breast   Social History  Substance Use Topics  . Smoking status: Former Smoker -- 0.10 packs/day    Types:  Cigarettes    Quit date: 09/12/2014  . Smokeless tobacco: Never Used  . Alcohol Use: No   OB History    Gravida Para Term Preterm AB TAB SAB Ectopic Multiple Living   5 2 2  0 3 1 2  0 0 2     Review of Systems  Constitutional: Positive for activity change and appetite change. Negative for fever.  HENT: Positive for rhinorrhea. Negative for congestion, facial swelling and sore throat.   Eyes: Positive for pain. Negative for redness and visual disturbance.  Respiratory: Negative.  Negative for cough and shortness of breath.   Cardiovascular: Negative.   Gastrointestinal: Negative.  Negative for nausea, vomiting and abdominal pain.  Genitourinary: Negative.   Musculoskeletal: Negative.   Skin: Negative.  Negative for color change, pallor and rash.  Neurological: Positive for headaches. Negative for dizziness, tremors, syncope, facial asymmetry, speech difficulty, weakness and numbness.  Hematological: Negative for adenopathy. Does not bruise/bleed easily.  Psychiatric/Behavioral: Positive for sleep disturbance. Negative for behavioral problems and agitation.    Allergies  Codeine and Septra  Home Medications   Prior to Admission medications   Medication Sig Start Date End Date Taking? Authorizing Provider  acetaminophen (TYLENOL) 500 MG tablet Take 1,500 mg by mouth every 8 (eight) hours as needed for mild pain, moderate pain, fever or headache (for pain.).     Historical Provider, MD  butalbital-acetaminophen-caffeine (FIORICET)  50-325-40 MG tablet Take 1-2 tablets by mouth every 6 (six) hours as needed for headache. 03/26/15   Janne Napoleon, NP  ibuprofen (ADVIL,MOTRIN) 600 MG tablet Take 1 tablet (600 mg total) by mouth every 6 (six) hours as needed. 03/06/15   Okey Regal, PA-C   Meds Ordered and Administered this Visit   Medications  dexamethasone (DECADRON) injection 10 mg (10 mg Intramuscular Given 03/26/15 1751)  ketorolac (TORADOL) 30 MG/ML injection 30 mg (30 mg  Intramuscular Given 03/26/15 1751)  diphenhydrAMINE (BENADRYL) injection 50 mg (50 mg Intramuscular Given 03/26/15 1751)  acetaminophen (TYLENOL) tablet 650 mg (650 mg Oral Given 03/26/15 1751)    BP 111/74 mmHg  Pulse 71  Temp(Src) 98.4 F (36.9 C) (Oral)  Resp 16  SpO2 100%  LMP 02/25/2015 No data found.   Physical Exam  Constitutional: She is oriented to person, place, and time. She appears well-developed and well-nourished. No distress.  HENT:  Head: Normocephalic and atraumatic.  Mouth/Throat: Oropharynx is clear and moist. No oropharyngeal exudate.  Require one of these she required regarding explaining what will check we will not be durable medical equipment a DMV require these are her Bilateral TMs are normal. No hemotympanums Tenderness over the right brow and right temple. Interestingly, she maintains a tight furrowing of the brow and forehead muscles the entire time I am in the room.  Eyes: Conjunctivae and EOM are normal. Pupils are equal, round, and reactive to light. Right eye exhibits no discharge. Left eye exhibits no discharge.  Neck: Normal range of motion. Neck supple.  Cardiovascular: Normal rate, normal heart sounds and intact distal pulses.   Pulmonary/Chest: Effort normal and breath sounds normal. No respiratory distress. She has no wheezes.  Abdominal: Soft. There is no tenderness.  Musculoskeletal: Normal range of motion. She exhibits no edema.  Lymphadenopathy:    She has no cervical adenopathy.  Neurological: She is alert and oriented to person, place, and time. She has normal strength. She displays no tremor. No cranial nerve deficit or sensory deficit. She exhibits normal muscle tone. She displays a negative Romberg sign. Coordination and gait normal.  Heel to toe normal. No dysmetria  Skin: Skin is warm and dry.  Psychiatric: She has a normal mood and affect.  Nursing note and vitals reviewed.   ED Course  Procedures (including critical care  time)  Labs Review Labs Reviewed - No data to display  Imaging Review No results found.   Visual Acuity Review  Right Eye Distance:   Left Eye Distance:   Bilateral Distance:    Right Eye Near:   Left Eye Near:    Bilateral Near:         MDM   1. Mixed migraine and muscle contraction headache    Patient states she has partial relief from the medications she was administered here as listed above. She remained stable. She has no neurologic deficits or abnormal findings. fioricet #10 Follow-up with your PCP. Call for appointment. He may need referral to neurologist. For any worsening new symptoms or problems as discussed and written on her instruction sheet or directly to the emergency department promptly or call EMS.  Janne Napoleon, NP 03/26/15 [phone]

## 2015-03-26 NOTE — ED Notes (Signed)
Headache for 3 days, headache across forehead, eyes feel like pressure is behind eyes.  Runny nose, brown-tan phlegm.

## 2015-04-09 ENCOUNTER — Emergency Department (HOSPITAL_COMMUNITY)
Admission: EM | Admit: 2015-04-09 | Discharge: 2015-04-09 | Disposition: A | Discharge status: Home / Self Care | Payer: Non-veteran care | Attending: Emergency Medicine | Admitting: Emergency Medicine

## 2015-04-09 ENCOUNTER — Encounter (HOSPITAL_COMMUNITY): Payer: Self-pay | Admitting: Neurology

## 2015-04-09 DIAGNOSIS — F17210 Nicotine dependence, cigarettes, uncomplicated: Secondary | ICD-10-CM | POA: Diagnosis not present

## 2015-04-09 DIAGNOSIS — L0231 Cutaneous abscess of buttock: Secondary | ICD-10-CM | POA: Diagnosis not present

## 2015-04-09 DIAGNOSIS — L0291 Cutaneous abscess, unspecified: Secondary | ICD-10-CM

## 2015-04-09 DIAGNOSIS — Z87448 Personal history of other diseases of urinary system: Secondary | ICD-10-CM | POA: Diagnosis not present

## 2015-04-09 DIAGNOSIS — Z8639 Personal history of other endocrine, nutritional and metabolic disease: Secondary | ICD-10-CM | POA: Insufficient documentation

## 2015-04-09 DIAGNOSIS — G8929 Other chronic pain: Secondary | ICD-10-CM | POA: Diagnosis not present

## 2015-04-09 DIAGNOSIS — Z862 Personal history of diseases of the blood and blood-forming organs and certain disorders involving the immune mechanism: Secondary | ICD-10-CM | POA: Diagnosis not present

## 2015-04-09 MED ORDER — LIDOCAINE HCL 2 % IJ SOLN
10.0000 mL | Freq: Once | INTRAMUSCULAR | Status: AC
  Administered 2015-04-09: 200 mg via INTRADERMAL
  Filled 2015-04-09: qty 20

## 2015-04-09 NOTE — ED Notes (Signed)
Declined W/C at D/C and was escorted to lobby by RN. 

## 2015-04-09 NOTE — Discharge Instructions (Signed)
Keep wound clean, dry and covered. Good to your PCP, an urgent care or return to the emergency department in 2 days for a wound check. Return to the emergency department with fevers, chills, nausea, vomiting, redness of the skin surrounding the wound or purulent drainage from the wound.   Abscess An abscess is an infected area that contains a collection of pus and debris.It can occur in almost any part of the body. An abscess is also known as a furuncle or boil. CAUSES  An abscess occurs when tissue gets infected. This can occur from blockage of oil or sweat glands, infection of hair follicles, or a minor injury to the skin. As the body tries to fight the infection, pus collects in the area and creates pressure under the skin. This pressure causes pain. People with weakened immune systems have difficulty fighting infections and get certain abscesses more often.  SYMPTOMS Usually an abscess develops on the skin and becomes a painful mass that is red, warm, and tender. If the abscess forms under the skin, you may feel a moveable soft area under the skin. Some abscesses break open (rupture) on their own, but most will continue to get worse without care. The infection can spread deeper into the body and eventually into the bloodstream, causing you to feel ill.  DIAGNOSIS  Your caregiver will take your medical history and perform a physical exam. A sample of fluid may also be taken from the abscess to determine what is causing your infection. TREATMENT  Your caregiver may prescribe antibiotic medicines to fight the infection. However, taking antibiotics alone usually does not cure an abscess. Your caregiver may need to make a small cut (incision) in the abscess to drain the pus. In some cases, gauze is packed into the abscess to reduce pain and to continue draining the area. HOME CARE INSTRUCTIONS   Only take over-the-counter or prescription medicines for pain, discomfort, or fever as directed by your  caregiver.  If you were prescribed antibiotics, take them as directed. Finish them even if you start to feel better.  If gauze is used, follow your caregiver's directions for changing the gauze.  To avoid spreading the infection:  Keep your draining abscess covered with a bandage.  Wash your hands well.  Do not share personal care items, towels, or whirlpools with others.  Avoid skin contact with others.  Keep your skin and clothes clean around the abscess.  Keep all follow-up appointments as directed by your caregiver. SEEK MEDICAL CARE IF:   You have increased pain, swelling, redness, fluid drainage, or bleeding.  You have muscle aches, chills, or a general ill feeling.  You have a fever. MAKE SURE YOU:   Understand these instructions.  Will watch your condition.  Will get help right away if you are not doing well or get worse.   This information is not intended to replace advice given to you by your health care provider. Make sure you discuss any questions you have with your health care provider.   Document Released: 02/16/2005 Document Revised: 11/08/2011 Document Reviewed: 07/22/2011 Elsevier Interactive Patient Education [employer].

## 2015-04-09 NOTE — ED Notes (Signed)
Pt reports abscess to left buttocks for 2 weeks. Has been trying warm compresses without relief. Is no draining. Has hx of abscesses

## 2015-04-09 NOTE — ED Provider Notes (Signed)
CSN: [id number]     Arrival date & time 04/09/15  0803 History   None    Chief Complaint  Patient presents with  . Abscess   HPI  Ms. Kinnee is a 34 year old female with PMHx of abscesses with an abscess of the left buttock. She reports that the abscess appeared approximately 2 weeks ago. She has been using hot compresses to try and open the abscess but reports that it has not begun to drain yet. She reports increasing pain with sitting and the area is extremely tender to touch. She denies associated fevers, chills, abdominal pain, nausea, vomiting, constipation, diarrhea, dysuria or any other complaints at this time.  Past Medical History  Diagnosis Date  . Pyelonephritis     x 3 episodes  . Abnormal Pap smear of cervix     LEEP 2011  . Hypoglycemia   . Anemia     with pregnancy  . Chronic headaches   . Cyst near coccyx   . Pilonidal cyst   . Sickle cell trait [facility])    Past Surgical History  Procedure Laterality Date  . Dilation and curettage of uterus  2008  . Leep  2011  . Other surgical history      Pynlodial cyst removed from tail bone 2012  . Other surgical history  10/2010    removal of cyst near coxxyx  . Pilonidal cyst excision  10/2010   Family History  Problem Relation Age of Onset  . Hypertension Brother   . Cancer Maternal Aunt     breast   Social History  Substance Use Topics  . Smoking status: Current Every Day Smoker -- 0.10 packs/day    Types: Cigarettes    Last Attempt to Quit: 09/12/2014  . Smokeless tobacco: Never Used  . Alcohol Use: No   OB History    Gravida Para Term Preterm AB TAB SAB Ectopic Multiple Living   5 2 2  0 3 1 2  0 0 2     Review of Systems  Skin: Positive for wound (abscess).  All other systems reviewed and are negative.     Allergies  Codeine and Septra  Home Medications   Prior to Admission medications   Medication Sig Start Date End Date Taking? Authorizing Provider  acetaminophen (TYLENOL) 500 MG tablet Take  1,500 mg by mouth every 8 (eight) hours as needed for mild pain, moderate pain, fever or headache (for pain.).     Historical Provider, MD  butalbital-acetaminophen-caffeine (FIORICET) 50-325-40 MG tablet Take 1-2 tablets by mouth every 6 (six) hours as needed for headache. 03/26/15   Janne Napoleon, NP  ibuprofen (ADVIL,MOTRIN) 600 MG tablet Take 1 tablet (600 mg total) by mouth every 6 (six) hours as needed. 03/06/15   Jeffrey Hedges, PA-C   BP 128/86 mmHg  Pulse 77  Temp(Src) 97.6 F (36.4 C) (Oral)  Resp 16  SpO2 100%  LMP 03/20/2015 Physical Exam  Constitutional: She appears well-developed and well-nourished. No distress.  Nontoxic, no acute distress  HENT:  Head: Normocephalic and atraumatic.  Eyes: Conjunctivae are normal. Right eye exhibits no discharge. Left eye exhibits no discharge. No scleral icterus.  Neck: Normal range of motion.  Cardiovascular: Normal rate and regular rhythm.   Pulmonary/Chest: Effort normal. No respiratory distress.  Abdominal: Soft. She exhibits no distension. There is no tenderness.  Musculoskeletal: Normal range of motion.  Neurological: She is alert. Coordination normal.  Skin: Skin is warm and dry.     Small  1 cm abscess to left buttock. Central fluctuance with surrounding induration. Erythema over the abscess but not extending to the skin outside the abscess. No streaking. Abscess is mildly tender to palpation. No warmth to touch.  Psychiatric: She has a normal mood and affect. Her behavior is normal.  Nursing note and vitals reviewed.   ED Course  Procedures (including critical care time) INCISION AND DRAINAGE Performed by: Eston Esters Consent: Verbal consent obtained. Risks and benefits: risks, benefits and alternatives were discussed Type: abscess  Body area: left buttock  Anesthesia: local infiltration  Incision was made with a scalpel.  Local anesthetic: lidocaine 2% without epinephrine  Anesthetic total: 4 ml  Complexity:  complex Blunt dissection to break up loculations  Drainage: purulent  Drainage amount: minimal  Patient tolerance: Patient tolerated the procedure well with no immediate complications.   Labs Review Labs Reviewed - No data to display  Imaging Review No results found. I have personally reviewed and evaluated these images and lab results as part of my medical decision-making.   EKG Interpretation None      MDM   Final diagnoses:  Abscess   Patient presenting with skin abscess of the left  buttock amenable to incision and drainage.  Patient tolerated the procedure well. No packing or drain inserted. Antibiotic therapy is not indicated. Encouraged warm soaks at home and keeping the wound clean and dry. Instructed to go to PCP or urgent care in 2 days for wound recheck. Patient expresses understanding and is stable for discharge.      Josephina Gip, PA-C 04/09/15 0849  Josephina Gip, PA-C 04/09/15 0849  Josephina Gip, PA-C 04/09/15 [id number]  Virgel Manifold, MD 04/10/15 [phone]

## 2015-07-11 ENCOUNTER — Encounter (HOSPITAL_COMMUNITY): Payer: Self-pay | Admitting: Emergency Medicine

## 2015-07-11 ENCOUNTER — Emergency Department (HOSPITAL_COMMUNITY)
Admission: EM | Admit: 2015-07-11 | Discharge: 2015-07-11 | Disposition: A | Discharge status: Home / Self Care | Payer: Non-veteran care | Attending: Emergency Medicine | Admitting: Emergency Medicine

## 2015-07-11 DIAGNOSIS — F17210 Nicotine dependence, cigarettes, uncomplicated: Secondary | ICD-10-CM | POA: Insufficient documentation

## 2015-07-11 DIAGNOSIS — R109 Unspecified abdominal pain: Secondary | ICD-10-CM | POA: Insufficient documentation

## 2015-07-11 DIAGNOSIS — G8929 Other chronic pain: Secondary | ICD-10-CM | POA: Insufficient documentation

## 2015-07-11 DIAGNOSIS — R112 Nausea with vomiting, unspecified: Secondary | ICD-10-CM | POA: Diagnosis not present

## 2015-07-11 LAB — COMPREHENSIVE METABOLIC PANEL
ALT: 10 U/L — AB (ref 14–54)
AST: 17 U/L (ref 15–41)
Albumin: 4.3 g/dL (ref 3.5–5.0)
Alkaline Phosphatase: 47 U/L (ref 38–126)
Anion gap: 9 (ref 5–15)
BUN: 11 mg/dL (ref 6–20)
CHLORIDE: 105 mmol/L (ref 101–111)
CO2: 24 mmol/L (ref 22–32)
CREATININE: 0.71 mg/dL (ref 0.44–1.00)
Calcium: 9 mg/dL (ref 8.9–10.3)
GFR calc Af Amer: >60 mL/min (ref >60)
Glucose, Bld: 92 mg/dL (ref 65–99)
Potassium: 3.5 mmol/L (ref 3.5–5.1)
Sodium: 138 mmol/L (ref 135–145)
TOTAL PROTEIN: 7.2 g/dL (ref 6.5–8.1)
Total Bilirubin: 1.3 mg/dL — ABNORMAL HIGH (ref 0.3–1.2)

## 2015-07-11 LAB — CBC
HCT: 35.7 % — ABNORMAL LOW (ref 36.0–46.0)
Hemoglobin: 12.1 g/dL (ref 12.0–15.0)
MCH: 29.7 pg (ref 26.0–34.0)
MCHC: 33.9 g/dL (ref 30.0–36.0)
MCV: 87.7 fL (ref 78.0–100.0)
Platelets: 197 10*3/uL (ref 150–400)
RBC: 4.07 MIL/uL (ref 3.87–5.11)
RDW: 13.4 % (ref 11.5–15.5)
WBC: 6.3 10*3/uL (ref 4.0–10.5)

## 2015-07-11 LAB — LIPASE, BLOOD: Lipase: 18 U/L (ref 11–51)

## 2015-07-11 LAB — I-STAT BETA HCG BLOOD, ED (MC, WL, AP ONLY): I-stat hCG, quantitative: <5 m[IU]/mL (ref <5)

## 2015-07-11 NOTE — ED Notes (Signed)
Pt reports nausea , emesis , abd pain started today. took bismol with no relief. denies diarrhea.

## 2015-07-11 NOTE — ED Notes (Signed)
Bed: WTR7 Expected date:  Expected time:  Means of arrival:  Comments: Phlebotomy 

## 2015-09-29 ENCOUNTER — Encounter (HOSPITAL_COMMUNITY): Payer: Self-pay | Admitting: Emergency Medicine

## 2015-09-29 ENCOUNTER — Emergency Department (HOSPITAL_COMMUNITY)
Admission: EM | Admit: 2015-09-29 | Discharge: 2015-09-29 | Disposition: A | Discharge status: Home / Self Care | Payer: No Typology Code available for payment source | Attending: Emergency Medicine | Admitting: Emergency Medicine

## 2015-09-29 DIAGNOSIS — M545 Low back pain: Secondary | ICD-10-CM | POA: Diagnosis not present

## 2015-09-29 DIAGNOSIS — Z79899 Other long term (current) drug therapy: Secondary | ICD-10-CM | POA: Insufficient documentation

## 2015-09-29 DIAGNOSIS — E162 Hypoglycemia, unspecified: Secondary | ICD-10-CM | POA: Diagnosis not present

## 2015-09-29 DIAGNOSIS — Z791 Long term (current) use of non-steroidal anti-inflammatories (NSAID): Secondary | ICD-10-CM | POA: Insufficient documentation

## 2015-09-29 DIAGNOSIS — Z87891 Personal history of nicotine dependence: Secondary | ICD-10-CM | POA: Diagnosis not present

## 2015-09-29 MED ORDER — HYDROCODONE-ACETAMINOPHEN 5-325 MG PO TABS: 1.0000 | ORAL_TABLET | Freq: Four times a day (QID) | ORAL | Status: DC | PRN
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
## 2015-09-29 NOTE — Discharge Instructions (Signed)
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
## 2016-01-09 ENCOUNTER — Emergency Department (HOSPITAL_COMMUNITY)
Admission: EM | Admit: 2016-01-09 | Discharge: 2016-01-09 | Discharge status: Against Medical Advice | Payer: Non-veteran care | Attending: Dermatology | Admitting: Dermatology

## 2016-01-09 ENCOUNTER — Encounter (HOSPITAL_COMMUNITY): Payer: Self-pay

## 2016-01-09 DIAGNOSIS — R51 Headache: Secondary | ICD-10-CM | POA: Diagnosis present

## 2016-01-09 DIAGNOSIS — Z87891 Personal history of nicotine dependence: Secondary | ICD-10-CM | POA: Insufficient documentation

## 2016-01-09 DIAGNOSIS — R519 Headache, unspecified: Secondary | ICD-10-CM

## 2016-01-09 DIAGNOSIS — Z5321 Procedure and treatment not carried out due to patient leaving prior to being seen by health care provider: Secondary | ICD-10-CM | POA: Diagnosis not present

## 2016-01-09 HISTORY — DX: Migraine, unspecified, not intractable, without status migrainosus: G43.909

## 2016-01-09 MED ORDER — ONDANSETRON 4 MG PO TBDP
4.0000 mg | ORAL_TABLET | Freq: Once | ORAL | Status: AC | PRN
Start: 2016-01-09 — End: 2016-01-09
  Administered 2016-01-09: 4 mg via ORAL
  Filled 2016-01-09: qty 1

## 2016-01-09 NOTE — ED Triage Notes (Signed)
PT C/O A HEADACHE, N/V, AND LIGHT SENSITIVITY THAT HS GOTTEN PROGRESSIVELY WORSE SINCE 10 AM TODAY. PT STS SHE HAD INJECTIONS FOR ALOPECIA ON WED, BUT DOES NOT THINK THAT IS THE CAUSE. PT ACTIVELY VOMITING.

## 2016-01-09 NOTE — ED Provider Notes (Signed)
Patient presented with migraine headache however eloped prior to my assessment. Patient was seen walking out of the ED with an incompetent patient's.   Margaret Blank, MD 01/09/16 [phone]

## 2016-01-09 NOTE — ED Notes (Signed)
Pt came up to registration window demanding a note for work. Pt was informed that she would need to be seen by a doctor in order to receive discharge paperwork. Pt became hysterical stating "I know you don't give a fuck that I'm in pain, but I'm in PAIN". Pt informed she could stay and wait to be seen, pt continues to be tearful and making a scene in the lobby. This Probation officer informed the patient that she would get a nurse and be right back.

## 2016-01-10 ENCOUNTER — Emergency Department (HOSPITAL_COMMUNITY)
Admission: EM | Admit: 2016-01-10 | Discharge: 2016-01-10 | Disposition: A | Discharge status: Home / Self Care | Payer: Non-veteran care | Attending: Emergency Medicine | Admitting: Emergency Medicine

## 2016-01-10 ENCOUNTER — Encounter (HOSPITAL_COMMUNITY): Payer: Self-pay | Admitting: Emergency Medicine

## 2016-01-10 DIAGNOSIS — Z87891 Personal history of nicotine dependence: Secondary | ICD-10-CM | POA: Insufficient documentation

## 2016-01-10 DIAGNOSIS — R51 Headache: Secondary | ICD-10-CM | POA: Insufficient documentation

## 2016-01-10 DIAGNOSIS — Z79899 Other long term (current) drug therapy: Secondary | ICD-10-CM | POA: Insufficient documentation

## 2016-01-10 DIAGNOSIS — R519 Headache, unspecified: Secondary | ICD-10-CM

## 2016-01-10 DIAGNOSIS — Z791 Long term (current) use of non-steroidal anti-inflammatories (NSAID): Secondary | ICD-10-CM | POA: Insufficient documentation

## 2016-01-10 MED ORDER — DIPHENHYDRAMINE HCL 50 MG/ML IJ SOLN
12.5000 mg | Freq: Once | INTRAMUSCULAR | Status: AC
  Administered 2016-01-10: 12.5 mg via INTRAVENOUS
  Filled 2016-01-10: qty 1

## 2016-01-10 MED ORDER — SODIUM CHLORIDE 0.9 % IV BOLUS (SEPSIS)
1000.0000 mL | Freq: Once | INTRAVENOUS | Status: AC
  Administered 2016-01-10: 1000 mL via INTRAVENOUS

## 2016-01-10 MED ORDER — ONDANSETRON 4 MG PO TBDP
4.0000 mg | ORAL_TABLET | Freq: Three times a day (TID) | ORAL | 0 refills | Status: DC | PRN
Start: 2016-01-10 — End: 2016-03-03

## 2016-01-10 MED ORDER — ONDANSETRON 4 MG PO TBDP
4.0000 mg | ORAL_TABLET | Freq: Once | ORAL | Status: AC | PRN
  Administered 2016-01-10: 4 mg via ORAL
  Filled 2016-01-10: qty 1

## 2016-01-10 MED ORDER — KETOROLAC TROMETHAMINE 30 MG/ML IJ SOLN
30.0000 mg | Freq: Once | INTRAMUSCULAR | Status: AC
  Administered 2016-01-10: 30 mg via INTRAVENOUS
  Filled 2016-01-10: qty 1

## 2016-01-10 NOTE — Discharge Instructions (Signed)
Read the information below.   Your headache improved with fluids and medicine. Be sure to stay well hydrated. I encourage you to take tylenol 650mg  every 6hrs or motrin 400mg  every 6hrs for relief of headache. I am prescribing zofran for relief of nausea.  Use the prescribed medication as directed.  Please discuss all new medications with your pharmacist.    Be sure to follow up with your [place] provider for further evaluation and management of your headaches, I have provided the contact information for [facility] [place], have the [place] provide referral to the clinic.  You may return to the Emergency Department at any time for worsening condition or any new symptoms that concern you. Return to ED if you develop fever, neck pain, dizziness/lightheadedness, sudden onset of the worst headache, numbness/weakness, slurred speech, or facial droop.

## 2016-01-10 NOTE — ED Triage Notes (Signed)
Patient c/o headache that turned into migraine yesterday with n/v. Patient states that she came here to be seen and after waiting 5 hours she left before being seen by MD.  Patient having emesis 2 times this morning.

## 2016-01-10 NOTE — ED Provider Notes (Signed)
[place] DEPT Provider Note   CSN: [id number] Arrival date & time: 01/10/16  0945     History   Chief Complaint Chief Complaint  Patient presents with  . Migraine  . Emesis    HPI Margaret Cameron is a 35 y.o. female.  Margaret Cameron is a 35 y.o. female with history of migraines, alopecia, anemia, sickle cell trait presents to ED with complaint of migraine. Patient states headache started yesterday morning, gradual in onset, 10/10, throbbing in nature. She has associated photophobia, nausea, and vomiting. She denies head trauma, phonophobia, fever, neck pain, numbness, weakness, or new changes in vision. Headache is the same as previous, except she has had some nausea and vomiting. She tried ASA last night with some relief. No chest pain, SOB, dysuria, hematuria, abdominal pain, congestion, or rash.       Past Medical History:  Diagnosis Date  . Abnormal Pap smear of cervix    LEEP 2011  . Anemia    with pregnancy  . Chronic headaches   . Cyst near coccyx   . Hypoglycemia   . Migraine   . Pilonidal cyst   . Pyelonephritis    x 3 episodes  . Sickle cell trait [facility])     Patient Active Problem List   Diagnosis Date Noted  . SAB (spontaneous abortion) in 2nd trimester 2012--16 weeks, PROM, induced 02/25/2013  . Two previous spontaneous abortions (SAB) affecting care of mother, antepartum 02/25/2013  . GBS (group B streptococcus) UTI complicating pregnancy [id number]  . E-coli UTI 02/25/2013  . Vaginal delivery 02/24/2013  . Hx LEEP (loop electrosurgical excision procedure), cervix, pregnancy 12/09/2012  . Hx of pyelonephritis 12/09/2012  . Anemia 12/09/2012  . Pilonidal cyst without abscess 08/29/2011    Past Surgical History:  Procedure Laterality Date  . DILATION AND CURETTAGE OF UTERUS  2008  . LEEP  2011  . OTHER SURGICAL HISTORY     Pynlodial cyst removed from tail bone 2012  . OTHER SURGICAL HISTORY  10/2010   removal of cyst near coxxyx  .  PILONIDAL CYST EXCISION  10/2010    OB History    Gravida Para Term Preterm AB Living   5 2 2  0 3 2   SAB TAB Ectopic Multiple Live Births   2 1 0 0 2       Home Medications    Prior to Admission medications   Medication Sig Start Date End Date Taking? Authorizing Provider  FLUOCINOLONE ACETONIDE BODY 0.01 % OIL Apply 1 application topically 3 (three) times a week. Pt applies to scalp.   Yes Historical Provider, MD  ibuprofen (ADVIL,MOTRIN) 200 MG tablet Take 400 mg by mouth every 6 (six) hours as needed for headache, mild pain or moderate pain.   Yes Historical Provider, MD  ketoconazole (NIZORAL) 2 % shampoo Apply 1 application topically 2 (two) times a week.   Yes Historical Provider, MD  ondansetron (ZOFRAN ODT) 4 MG disintegrating tablet Take 1 tablet (4 mg total) by mouth every 8 (eight) hours as needed for nausea or vomiting. 01/10/16   Roxanna Mew, PA-C    Family History Family History  Problem Relation Age of Onset  . Hypertension Brother   . Cancer Maternal Aunt     breast    Social History Social History  Substance Use Topics  . Smoking status: Former Smoker    Packs/day: 0.10    Types: Cigarettes    Quit date: 07/14/2014  . Smokeless tobacco: Never Used  .  Alcohol use No     Allergies   Codeine and Septra [bactrim]   Review of Systems Review of Systems  Constitutional: Negative for chills, diaphoresis and fever.  HENT: Negative for trouble swallowing.   Eyes: Positive for photophobia.  Respiratory: Negative for shortness of breath.   Cardiovascular: Negative for chest pain.  Gastrointestinal: Positive for nausea and vomiting. Negative for abdominal pain, blood in stool, constipation and diarrhea.  Genitourinary: Negative for dysuria and hematuria.  Musculoskeletal: Negative for neck pain.  Skin: Negative for rash.  Neurological: Positive for headaches. Negative for dizziness, syncope, facial asymmetry, speech difficulty, weakness,  light-headedness and numbness.     Physical Exam Updated Vital Signs BP 141/70 (BP Location: Left Arm)   Pulse 93   Temp 97.7 F (36.5 C) (Oral)   Resp 19   Ht 5\' 6"  (1.676 m)   Wt 81.6 kg   LMP 01/05/2016   SpO2 100%   BMI 29.05 kg/m   Physical Exam  Constitutional: She appears well-developed and well-nourished. No distress.  HENT:  Head: Normocephalic and atraumatic.  Mouth/Throat: Oropharynx is clear and moist. No oropharyngeal exudate.  Eyes: Conjunctivae and EOM are normal. Pupils are equal, round, and reactive to light. Right eye exhibits no discharge. Left eye exhibits no discharge. No scleral icterus.  Neck: Normal range of motion and phonation normal. Neck supple. No spinous process tenderness and no muscular tenderness present. No neck rigidity. Normal range of motion present.  Cardiovascular: Normal rate, regular rhythm, normal heart sounds and intact distal pulses.   No murmur heard. Pulmonary/Chest: Effort normal and breath sounds normal. No stridor. No respiratory distress. She has no wheezes. She has no rales.  Abdominal: Soft. Bowel sounds are normal. She exhibits no distension. There is no tenderness. There is no rebound and no guarding.  Musculoskeletal: Normal range of motion.  Lymphadenopathy:    She has no cervical adenopathy.  Neurological: She is alert. She is not disoriented. Coordination normal. GCS eye subscore is 4. GCS verbal subscore is 5. GCS motor subscore is 6.  Mental Status:  Alert, thought content appropriate, able to give a coherent history. Speech fluent without evidence of aphasia. Able to follow 2 step commands without difficulty.  Cranial Nerves:  II:  Peripheral visual fields grossly normal, pupils equal, round, reactive to light III,IV, VI: ptosis not present, extra-ocular motions intact bilaterally  V,VII: smile symmetric, facial light touch sensation equal VIII: hearing grossly normal to voice  X: uvula elevates symmetrically  XI:  bilateral shoulder shrug symmetric and strong XII: midline tongue extension without fassiculations Motor:  Normal tone. 5/5 in upper and lower extremities bilaterally including strong and equal grip strength and dorsiflexion/plantar flexion Sensory: light touch normal in all extremities. Cerebellar: normal finger-to-nose with bilateral upper extremities Gait: normal gait and balance CV: distal pulses palpable throughout   Skin: Skin is warm and dry. She is not diaphoretic.  Psychiatric: She has a normal mood and affect. Her behavior is normal.    ED Treatments / Results  Labs (all labs ordered are listed, but only abnormal results are displayed) Labs Reviewed - No data to display  EKG  EKG Interpretation None       Radiology No results found.  Procedures Procedures (including critical care time)  Medications Ordered in ED Medications  ondansetron (ZOFRAN-ODT) disintegrating tablet 4 mg (4 mg Oral Given 01/10/16 1000)  ketorolac (TORADOL) 30 MG/ML injection 30 mg (30 mg Intravenous Given 01/10/16 1200)  diphenhydrAMINE (BENADRYL) injection 12.5 mg (  12.5 mg Intravenous Given 01/10/16 1159)  sodium chloride 0.9 % bolus 1,000 mL (0 mLs Intravenous Stopped 01/10/16 1300)     Initial Impression / Assessment and Plan / ED Course  I have reviewed the triage vital signs and the nursing notes.  Pertinent labs & imaging results that were available during my care of the patient were reviewed by me and considered in my medical decision making (see chart for details).  Clinical Course  Comment By Time  On re-evaluation, patient endorses improvement in headache to 3/10 and feels ready to go home.  Roxanna Mew, [place] 08/20 1312    Patient is afebrile and non-toxic appearing. She appears in obvious pain with wet cloth over eyes. Vital signs are stable. No focal neurologic deficits. No facial droop or evidence of aphasia. No nuchal rigidity. Presentation is non concerning for [facility],  ICH, Meningitis, or temporal arteritis. Will treat with IVF, toradol, zofran, and benadryl.   On re-evaluation patient endorses improvement in pain and feels ready to go home. Gait is steady. Patient is no longer nauseous and able to drink water. Discussed symptomatic management. Rx zofran. [facility] for further management of headaches. Return precautions discussed. Patient voiced understanding and is agreeable.    Final Clinical Impressions(s) / ED Diagnoses   Final diagnoses:  Nonintractable headache, unspecified chronicity pattern, unspecified headache type    New Prescriptions New Prescriptions   ONDANSETRON (ZOFRAN ODT) 4 MG DISINTEGRATING TABLET    Take 1 tablet (4 mg total) by mouth every 8 (eight) hours as needed for nausea or vomiting.     Roxanna Mew, PA-C 01/10/16 [place] Yao, MD 01/10/16 [phone]

## 2016-01-10 NOTE — ED Notes (Signed)
Pt ambulated out of department to meet her husband who will take pt home.  No reaction to medication noted at time of discharge.

## 2016-02-16 ENCOUNTER — Emergency Department (HOSPITAL_COMMUNITY)
Admission: EM | Admit: 2016-02-16 | Discharge: 2016-02-16 | Disposition: A | Discharge status: Home / Self Care | Payer: Non-veteran care | Attending: Emergency Medicine | Admitting: Emergency Medicine

## 2016-02-16 ENCOUNTER — Encounter (HOSPITAL_COMMUNITY): Payer: Self-pay

## 2016-02-16 DIAGNOSIS — Z87891 Personal history of nicotine dependence: Secondary | ICD-10-CM | POA: Diagnosis not present

## 2016-02-16 DIAGNOSIS — L03112 Cellulitis of left axilla: Secondary | ICD-10-CM | POA: Insufficient documentation

## 2016-02-16 DIAGNOSIS — L728 Other follicular cysts of the skin and subcutaneous tissue: Secondary | ICD-10-CM | POA: Diagnosis present

## 2016-02-16 MED ORDER — CLINDAMYCIN HCL 150 MG PO CAPS: 450.0000 mg | ORAL_CAPSULE | Freq: Three times a day (TID) | ORAL | 0 refills | Status: DC

## 2016-02-16 NOTE — ED Provider Notes (Signed)
[place] DEPT Provider Note   CSN: [id number] Arrival date & time: 02/16/16  [id number]  By signing my name below, I, Soijett Blue, attest that this documentation has been prepared under the direction and in the presence of Gloriann Loan, PA-C Electronically Signed: Soijett Blue, ED Scribe. 02/16/16. 9:59 AM.   History   Chief Complaint Chief Complaint  Patient presents with  . Cyst    HPI Margaret Cameron is a 35 y.o. female with a PMHx of sickle cell trait, who presents to the Emergency Department complaining of recurrent cyst to left axilla onset 1 week. Pt notes that she has had 3 other cyst to her left axilla. Pt reports that she has had the area I&D and was informed by the provider that they found the core, but she was informed that the cyst would come back  Pt is having associated symptoms of drainage and nausea. She notes that she has tried warm compresses and tylenol for the relief of her symptoms. She denies fever, chills, redness, vomiting, and any other symptoms.    The history is provided by the patient. No language interpreter was used.    Past Medical History:  Diagnosis Date  . Abnormal Pap smear of cervix    LEEP 2011  . Anemia    with pregnancy  . Chronic headaches   . Cyst near coccyx   . Hypoglycemia   . Migraine   . Pilonidal cyst   . Pyelonephritis    x 3 episodes  . Sickle cell trait [facility])     Patient Active Problem List   Diagnosis Date Noted  . SAB (spontaneous abortion) in 2nd trimester 2012--16 weeks, PROM, induced 02/25/2013  . Two previous spontaneous abortions (SAB) affecting care of mother, antepartum 02/25/2013  . GBS (group B streptococcus) UTI complicating pregnancy [id number]  . E-coli UTI 02/25/2013  . Vaginal delivery 02/24/2013  . Hx LEEP (loop electrosurgical excision procedure), cervix, pregnancy 12/09/2012  . Hx of pyelonephritis 12/09/2012  . Anemia 12/09/2012  . Pilonidal cyst without abscess 08/29/2011    Past Surgical  History:  Procedure Laterality Date  . DILATION AND CURETTAGE OF UTERUS  2008  . LEEP  2011  . OTHER SURGICAL HISTORY     Pynlodial cyst removed from tail bone 2012  . OTHER SURGICAL HISTORY  10/2010   removal of cyst near coxxyx  . PILONIDAL CYST EXCISION  10/2010    OB History    Gravida Para Term Preterm AB Living   5 2 2  0 3 2   SAB TAB Ectopic Multiple Live Births   2 1 0 0 2       Home Medications    Prior to Admission medications   Medication Sig Start Date End Date Taking? Authorizing Provider  clindamycin (CLEOCIN) 150 MG capsule Take 3 capsules (450 mg total) by mouth 3 (three) times daily. 02/16/16   Aino Heckert, PA-C  FLUOCINOLONE ACETONIDE BODY 0.01 % OIL Apply 1 application topically 3 (three) times a week. Pt applies to scalp.    Historical Provider, MD  ibuprofen (ADVIL,MOTRIN) 200 MG tablet Take 400 mg by mouth every 6 (six) hours as needed for headache, mild pain or moderate pain.    Historical Provider, MD  ketoconazole (NIZORAL) 2 % shampoo Apply 1 application topically 2 (two) times a week.    Historical Provider, MD  ondansetron (ZOFRAN ODT) 4 MG disintegrating tablet Take 1 tablet (4 mg total) by mouth every 8 (eight) hours as needed for nausea  or vomiting. 01/10/16   Roxanna Mew, PA-C    Family History Family History  Problem Relation Age of Onset  . Hypertension Brother   . Cancer Maternal Aunt     breast    Social History Social History  Substance Use Topics  . Smoking status: Former Smoker    Packs/day: 0.10    Types: Cigarettes    Quit date: 07/14/2014  . Smokeless tobacco: Never Used  . Alcohol use No     Allergies   Codeine and Septra [bactrim]   Review of Systems Review of Systems  Constitutional: Negative for chills and fever.  Gastrointestinal: Positive for nausea. Negative for vomiting.  Skin: Negative for color change.       Recurrent cyst to left axilla with spontaneous drainage     Physical Exam Updated Vital  Signs BP 114/69 (BP Location: Right Arm)   Pulse 73   Temp 97.8 F (36.6 C) (Oral)   Resp 16   Ht 5\' 6"  (1.676 m)   Wt 76.2 kg   LMP 01/18/2016 (Exact Date)   SpO2 100%   BMI 27.12 kg/m   Physical Exam  Constitutional: She is oriented to person, place, and time. She appears well-developed and well-nourished.  HENT:  Head: Normocephalic and atraumatic.  Right Ear: External ear normal.  Left Ear: External ear normal.  Eyes: Conjunctivae are normal. No scleral icterus.  Neck: No tracheal deviation present.  Pulmonary/Chest: Effort normal. No respiratory distress.  Abdominal: She exhibits no distension.  Musculoskeletal: Normal range of motion.  Neurological: She is alert and oriented to person, place, and time.  Skin: Skin is warm and dry. There is erythema.  1 cm area of minimal erythema and induration. No fluctuance. No drainage.   Psychiatric: She has a normal mood and affect. Her behavior is normal.     ED Treatments / Results  DIAGNOSTIC STUDIES: Oxygen Saturation is 100% on RA, nl by my interpretation.    COORDINATION OF CARE: 9:59 AM Discussed treatment plan with pt at bedside which includes doxycyline Rx, and pt agreed to plan.   Procedures Procedures (including critical care time)  Medications Ordered in ED Medications - No data to display   Initial Impression / Assessment and Plan / ED Course  I have reviewed the triage vital signs and the nursing notes.   Clinical Course   Patient presentation consistent with cellulitis. Afebrile. No tachycardia, hypotension or other symptoms suggestive of severe infection. Area has been demarcated and pt advised to follow up for wound check in 2-3 days, sooner for worsening systemic symptoms, new lymphangitis, or significant spread of erythema past line of demarcation. Will discharge with doxycyline Rx. Return precautions discussed. Pt appears safe for discharge.   Final Clinical Impressions(s) / ED Diagnoses   Final  diagnoses:  Cellulitis of left axilla    New Prescriptions Discharge Medication List as of 02/16/2016 10:50 AM    START taking these medications   Details  clindamycin (CLEOCIN) 150 MG capsule Take 3 capsules (450 mg total) by mouth 3 (three) times daily., Starting Tue 02/16/2016, Print        I personally performed the services described in this documentation, which was scribed in my presence. The recorded information has been reviewed and is accurate.     Gloriann Loan, PA-C 02/16/16 1546    Merrily Pew, MD 02/17/16 1228

## 2016-02-16 NOTE — ED Triage Notes (Signed)
Pt reports recurrent cyst in left axilla. This has been ongoing over several years. Cyst appears small at this time. No swelling, redness, or drainage noted at this time.

## 2016-02-16 NOTE — Discharge Instructions (Addendum)
Please take Clindamycin three times daily.  Continue warm compresses. You may take Tylenol and motrin for pain.  Follow up with general surgery.  Return if you experience increased pain, redness, swelling, fever, or any new symptoms.

## 2016-03-03 ENCOUNTER — Emergency Department (HOSPITAL_COMMUNITY)
Admission: EM | Admit: 2016-03-03 | Discharge: 2016-03-03 | Disposition: A | Discharge status: Home / Self Care | Payer: Non-veteran care | Attending: Emergency Medicine | Admitting: Emergency Medicine

## 2016-03-03 ENCOUNTER — Encounter (HOSPITAL_COMMUNITY): Payer: Self-pay | Admitting: Emergency Medicine

## 2016-03-03 DIAGNOSIS — L0232 Furuncle of buttock: Secondary | ICD-10-CM | POA: Insufficient documentation

## 2016-03-03 DIAGNOSIS — Z87891 Personal history of nicotine dependence: Secondary | ICD-10-CM | POA: Diagnosis not present

## 2016-03-03 DIAGNOSIS — Z79899 Other long term (current) drug therapy: Secondary | ICD-10-CM | POA: Diagnosis not present

## 2016-03-03 DIAGNOSIS — L0231 Cutaneous abscess of buttock: Secondary | ICD-10-CM | POA: Diagnosis present

## 2016-03-03 MED ORDER — LIDOCAINE-EPINEPHRINE (PF) 2 %-1:200000 IJ SOLN
10.0000 mL | Freq: Once | INTRAMUSCULAR | Status: AC
  Administered 2016-03-03: 10 mL via INTRADERMAL
  Filled 2016-03-03: qty 10

## 2016-03-03 NOTE — ED Provider Notes (Signed)
[place] DEPT Provider Note   CSN: [id number] Arrival date & time: 03/03/16  0709     History   Chief Complaint Chief Complaint  Patient presents with  . Abscess    HPI Margaret Cameron is a 35 y.o. female.  The history is provided by the patient.  Abscess  Location:  Pelvis Pelvic abscess location:  L buttock Size:  1cm Abscess quality: induration, itching and painful   Red streaking: no   Duration:  1 week Progression:  Unchanged Pain details:    Quality:  Aching   Severity:  Moderate   Duration:  1 week   Timing:  Constant   Progression:  Unchanged Chronicity:  New Context: not diabetes and not immunosuppression   Relieved by:  Nothing Worsened by:  Nothing Ineffective treatments:  None tried Associated symptoms: no fever and no vomiting   Risk factors: prior abscess     Past Medical History:  Diagnosis Date  . Abnormal Pap smear of cervix    LEEP 2011  . Anemia    with pregnancy  . Chronic headaches   . Cyst near coccyx   . Hypoglycemia   . Migraine   . Pilonidal cyst   . Pyelonephritis    x 3 episodes  . Sickle cell trait [facility])     Patient Active Problem List   Diagnosis Date Noted  . SAB (spontaneous abortion) in 2nd trimester 2012--16 weeks, PROM, induced 02/25/2013  . Two previous spontaneous abortions (SAB) affecting care of mother, antepartum 02/25/2013  . GBS (group B streptococcus) UTI complicating pregnancy [id number]  . E-coli UTI 02/25/2013  . Vaginal delivery 02/24/2013  . Hx LEEP (loop electrosurgical excision procedure), cervix, pregnancy 12/09/2012  . Hx of pyelonephritis 12/09/2012  . Anemia 12/09/2012  . Pilonidal cyst without abscess 08/29/2011    Past Surgical History:  Procedure Laterality Date  . DILATION AND CURETTAGE OF UTERUS  2008  . LEEP  2011  . OTHER SURGICAL HISTORY     Pynlodial cyst removed from tail bone 2012  . OTHER SURGICAL HISTORY  10/2010   removal of cyst near coxxyx  . PILONIDAL CYST  EXCISION  10/2010    OB History    Gravida Para Term Preterm AB Living   5 2 2  0 3 2   SAB TAB Ectopic Multiple Live Births   2 1 0 0 2       Home Medications    Prior to Admission medications   Medication Sig Start Date End Date Taking? Authorizing Provider  FLUOCINOLONE ACETONIDE BODY 0.01 % OIL Apply 1 application topically 2 (two) times a week. Pt applies to scalp.    Yes Historical Provider, MD  ketoconazole (NIZORAL) 2 % shampoo Apply 1 application topically 2 (two) times a week.   Yes Historical Provider, MD    Family History Family History  Problem Relation Age of Onset  . Hypertension Brother   . Cancer Maternal Aunt     breast    Social History Social History  Substance Use Topics  . Smoking status: Former Smoker    Packs/day: 0.10    Types: Cigarettes    Quit date: 07/14/2014  . Smokeless tobacco: Never Used  . Alcohol use No     Allergies   Codeine and Septra [bactrim]   Review of Systems Review of Systems  Constitutional: Negative for fever.  Gastrointestinal: Negative for vomiting.  All other systems reviewed and are negative.    Physical Exam Updated Vital Signs BP  130/64   Pulse 85   Temp 98 F (36.7 C) (Oral)   Resp 18   LMP 02/16/2016   SpO2 100%   Physical Exam  Constitutional: She is oriented to person, place, and time. She appears well-developed and well-nourished. No distress.  HENT:  Head: Normocephalic.  Nose: Nose normal.  Eyes: Conjunctivae are normal.  Neck: Neck supple. No tracheal deviation present.  Cardiovascular: Normal rate and regular rhythm.   Pulmonary/Chest: Effort normal. No respiratory distress.  Abdominal: Soft. She exhibits no distension.  Neurological: She is alert and oriented to person, place, and time.  Skin: Skin is warm and dry. Lesion (1cm furuncle without fluctuance) noted.  Psychiatric: She has a normal mood and affect.     ED Treatments / Results  Labs (all labs ordered are listed, but  only abnormal results are displayed) Labs Reviewed - No data to display  EKG  EKG Interpretation None       Radiology No results found.  Procedures Procedures (including critical care time)  INCISION AND DRAINAGE Performed by: Leo Grosser Consent: Verbal consent obtained. Risks and benefits: risks, benefits and alternatives were discussed Type: furuncle  Body area: left buttock  Anesthesia: local infiltration  Small incision was made with a scalpel to create a tract for better drainage.  Local anesthetic: lidocaine 2% w epinephrine  Anesthetic total: 5 ml  Complexity: complex Blunt dissection to break up loculations  Drainage: bloody  Drainage amount: scant  Packing material: none  Patient tolerance: Patient tolerated the procedure well with no immediate complications.    Medications Ordered in ED Medications  lidocaine-EPINEPHrine (XYLOCAINE W/EPI) 2 %-1:200000 (PF) injection 10 mL (10 mLs Intradermal Given by Other 03/03/16 [id number])     Initial Impression / Assessment and Plan / ED Course  I have reviewed the triage vital signs and the nursing notes.  Pertinent labs & imaging results that were available during my care of the patient were reviewed by me and considered in my medical decision making (see chart for details).  Clinical Course    Patient presents with furuncle for the last week. Indurated and painful. Located over left medial gluteus. No fluctuance. Has history of same. Drained as above with good relief of pressure after local anesthesia. No overlying cellulitis appreciated on exam and no indication for antibiotics at this time. Wound left open to drain and return precautions discussed for wound recheck.    Final Clinical Impressions(s) / ED Diagnoses   Final diagnoses:  Furuncle of buttock    New Prescriptions Current Discharge Medication List       Leo Grosser, MD 03/03/16 [phone]

## 2016-03-03 NOTE — ED Triage Notes (Signed)
Pt reports left buttock abscess x 1 month, progressively growing per pt. Drainage noted per pt. denies fever.

## 2016-03-24 ENCOUNTER — Emergency Department (HOSPITAL_COMMUNITY)
Admission: EM | Admit: 2016-03-24 | Discharge: 2016-03-24 | Disposition: A | Discharge status: Home / Self Care | Payer: Non-veteran care | Attending: Emergency Medicine | Admitting: Emergency Medicine

## 2016-03-24 ENCOUNTER — Encounter (HOSPITAL_COMMUNITY): Payer: Self-pay

## 2016-03-24 ENCOUNTER — Emergency Department (HOSPITAL_COMMUNITY): Payer: Non-veteran care

## 2016-03-24 DIAGNOSIS — S300XXA Contusion of lower back and pelvis, initial encounter: Secondary | ICD-10-CM

## 2016-03-24 DIAGNOSIS — Y929 Unspecified place or not applicable: Secondary | ICD-10-CM | POA: Insufficient documentation

## 2016-03-24 DIAGNOSIS — Y9389 Activity, other specified: Secondary | ICD-10-CM | POA: Insufficient documentation

## 2016-03-24 DIAGNOSIS — Z79899 Other long term (current) drug therapy: Secondary | ICD-10-CM | POA: Insufficient documentation

## 2016-03-24 DIAGNOSIS — W208XXA Other cause of strike by thrown, projected or falling object, initial encounter: Secondary | ICD-10-CM | POA: Insufficient documentation

## 2016-03-24 DIAGNOSIS — S3992XA Unspecified injury of lower back, initial encounter: Secondary | ICD-10-CM | POA: Diagnosis present

## 2016-03-24 DIAGNOSIS — Y990 Civilian activity done for income or pay: Secondary | ICD-10-CM | POA: Insufficient documentation

## 2016-03-24 DIAGNOSIS — S39012A Strain of muscle, fascia and tendon of lower back, initial encounter: Secondary | ICD-10-CM | POA: Insufficient documentation

## 2016-03-24 DIAGNOSIS — Z87891 Personal history of nicotine dependence: Secondary | ICD-10-CM | POA: Insufficient documentation

## 2016-03-24 LAB — URINALYSIS, ROUTINE W REFLEX MICROSCOPIC
Bilirubin Urine: NEGATIVE
Glucose, UA: NEGATIVE mg/dL
Hgb urine dipstick: NEGATIVE
Ketones, ur: NEGATIVE mg/dL
LEUKOCYTES UA: NEGATIVE
Nitrite: NEGATIVE
Protein, ur: NEGATIVE mg/dL
Specific Gravity, Urine: 1.02 (ref 1.005–1.030)
pH: 5.5 (ref 5.0–8.0)

## 2016-03-24 LAB — PREGNANCY, URINE: Preg Test, Ur: NEGATIVE

## 2016-03-24 MED ORDER — METAXALONE 800 MG PO TABS
800.0000 mg | ORAL_TABLET | Freq: Once | ORAL | Status: AC
  Administered 2016-03-24: 800 mg via ORAL
  Filled 2016-03-24: qty 1

## 2016-03-24 MED ORDER — KETOROLAC TROMETHAMINE 60 MG/2ML IM SOLN
30.0000 mg | Freq: Once | INTRAMUSCULAR | Status: AC
  Administered 2016-03-24: 30 mg via INTRAMUSCULAR
  Filled 2016-03-24: qty 2

## 2016-03-24 MED ORDER — METAXALONE 800 MG PO TABS: 800.0000 mg | ORAL_TABLET | Freq: Three times a day (TID) | ORAL | 0 refills | Status: DC | PRN

## 2016-03-24 NOTE — ED Provider Notes (Signed)
[facility] DEPT Provider Note   CSN: [id number] Arrival date & time: 03/24/16  [id number]     History   Chief Complaint Chief Complaint  Patient presents with  . Back Pain    HPI Margaret Cameron is a 35 y.o. female.  HPI Patient presents with back injury. States she was working at [employer] and a box fell and landed on her lower back. Happened yesterday. Has pain. States she was not able to sleep well due to the pain. No relief with aspirin. States she has had low back problems before at the spot but never this severe. No loss of bladder bowel control. No weakness. Increased pain with ambulation.   Past Medical History:  Diagnosis Date  . Abnormal Pap smear of cervix    LEEP 2011  . Anemia    with pregnancy  . Chronic headaches   . Cyst near coccyx   . Hypoglycemia   . Migraine   . Pilonidal cyst   . Pyelonephritis    x 3 episodes  . Sickle cell trait [facility])     Patient Active Problem List   Diagnosis Date Noted  . SAB (spontaneous abortion) in 2nd trimester 2012--16 weeks, PROM, induced 02/25/2013  . Two previous spontaneous abortions (SAB) affecting care of mother, antepartum 02/25/2013  . GBS (group B streptococcus) UTI complicating pregnancy [id number]  . E-coli UTI 02/25/2013  . Vaginal delivery 02/24/2013  . Hx LEEP (loop electrosurgical excision procedure), cervix, pregnancy 12/09/2012  . Hx of pyelonephritis 12/09/2012  . Anemia 12/09/2012  . Pilonidal cyst without abscess 08/29/2011    Past Surgical History:  Procedure Laterality Date  . DILATION AND CURETTAGE OF UTERUS  2008  . LEEP  2011  . OTHER SURGICAL HISTORY     Pynlodial cyst removed from tail bone 2012  . OTHER SURGICAL HISTORY  10/2010   removal of cyst near coxxyx  . PILONIDAL CYST EXCISION  10/2010    OB History    Gravida Para Term Preterm AB Living   5 2 2  0 3 2   SAB TAB Ectopic Multiple Live Births   2 1 0 0 2       Home Medications    Prior to Admission medications     Medication Sig Start Date End Date Taking? Authorizing Provider  FLUOCINOLONE ACETONIDE BODY 0.01 % OIL Apply 1 application topically 2 (two) times a week. Pt applies to scalp.    Yes Historical Provider, MD  ketoconazole (NIZORAL) 2 % shampoo Apply 1 application topically 2 (two) times a week.   Yes Historical Provider, MD  metaxalone (SKELAXIN) 800 MG tablet Take 1 tablet (800 mg total) by mouth 3 (three) times daily as needed for muscle spasms. 03/24/16   Davonna Belling, MD    Family History Family History  Problem Relation Age of Onset  . Hypertension Brother   . Cancer Maternal Aunt     breast    Social History Social History  Substance Use Topics  . Smoking status: Former Smoker    Packs/day: 0.10    Types: Cigarettes    Quit date: 07/14/2014  . Smokeless tobacco: Never Used  . Alcohol use No     Allergies   Codeine and Septra [bactrim]   Review of Systems Review of Systems  Constitutional: Negative for appetite change.  Respiratory: Negative for shortness of breath.   Cardiovascular: Negative for chest pain.  Gastrointestinal: Negative for abdominal pain.  Genitourinary: Negative for difficulty urinating and flank pain.  Musculoskeletal:  Positive for back pain. Negative for joint swelling.  Skin: Negative for rash.  Neurological: Negative for weakness.  Psychiatric/Behavioral: Negative for confusion.     Physical Exam Updated Vital Signs BP 114/68 (BP Location: Left Arm)   Pulse 75   Temp 97.9 F (36.6 C) (Oral)   Resp 18   LMP 03/16/2016   SpO2 95%   Physical Exam  Constitutional: She appears well-developed.  Patient was sitting up straight in bed.  HENT:  Head: Normocephalic.  Neck: Neck supple.  Cardiovascular: Normal rate.   Pulmonary/Chest: Effort normal.  Abdominal: There is no tenderness.  Musculoskeletal: She exhibits tenderness.  Tenderness over upper lumbar spine on the midline. No step-off. Some ecchymosis on the midline. Some  tenderness over lateral paraspinal musculature but no ecchymosis. Neurovascular intact in both feet.  Skin: Skin is warm. Capillary refill takes less than 2 seconds.     ED Treatments / Results  Labs (all labs ordered are listed, but only abnormal results are displayed) Labs Reviewed  URINALYSIS, ROUTINE W REFLEX MICROSCOPIC (NOT AT [facility])  PREGNANCY, URINE    EKG  EKG Interpretation None       Radiology Dg Lumbar Spine 2-3 Views  Result Date: 03/24/2016 CLINICAL DATA:  Pain. EXAM: LUMBAR SPINE - 2-3 VIEW COMPARISON:  04/19/2005. FINDINGS: The scoliosis lumbar spine concave left noted. No acute bony abnormality identified. No focal bony abnormality identified. Normal mineralization. IMPRESSION: Scoliosis lumbar spine concave left. No acute or focal bony abnormality. Electronically Signed   By: Marcello Moores  Register   On: 03/24/2016 08:27    Procedures Procedures (including critical care time)  Medications Ordered in ED Medications  ketorolac (TORADOL) injection 30 mg (30 mg Intramuscular Given 03/24/16 0837)  metaxalone (SKELAXIN) tablet 800 mg (800 mg Oral Given 03/24/16 0837)     Initial Impression / Assessment and Plan / ED Course  I have reviewed the triage vital signs and the nursing notes.  Pertinent labs & imaging results that were available during my care of the patient were reviewed by me and considered in my medical decision making (see chart for details).  Clinical Course    Patient presents with back pain after being hit by a box. Nonfocal exam. X-ray reassuring. Feels somewhat better after muscle relaxers and IM Toradol. Discharge home. Patient states she has follow-up with her primary care doctor.  Final Clinical Impressions(s) / ED Diagnoses   Final diagnoses:  Strain of lumbar region, initial encounter  Lumbar contusion, initial encounter    New Prescriptions New Prescriptions   METAXALONE (SKELAXIN) 800 MG TABLET    Take 1 tablet (800 mg total) by mouth  3 (three) times daily as needed for muscle spasms.     Davonna Belling, MD 03/24/16 [phone]

## 2016-03-24 NOTE — Discharge Instructions (Signed)
Follow-up with your doctor as planned 

## 2016-03-24 NOTE — ED Triage Notes (Signed)
Pt states she was loading a truck and a table fell on her lower back, pt has a bruised area and states that it's tender to touch

## 2016-03-24 NOTE — ED Notes (Signed)
Patient from home with c/o low and mid-back pain since heavy boxes fell on her yesterday at work.  Patient now c/o paraspinal pain and muscular pain to low back, radiating into hips.  Patient also c/o numbness in right back, radiating in to right hip.  She denies incontinence or saddle anesthesia.  Patient is painful to palpation along lumbar spine and surrounding muscles.  No step-offs noted.  No bruising or deformities noted.  Patient is ambulatory.

## 2016-05-27 ENCOUNTER — Encounter (HOSPITAL_COMMUNITY): Payer: Self-pay | Admitting: Emergency Medicine

## 2016-05-27 ENCOUNTER — Ambulatory Visit (HOSPITAL_COMMUNITY)
Admission: EM | Admit: 2016-05-27 | Discharge: 2016-05-27 | Disposition: A | Discharge status: Home / Self Care | Payer: Non-veteran care | Attending: Emergency Medicine | Admitting: Emergency Medicine

## 2016-05-27 ENCOUNTER — Ambulatory Visit (INDEPENDENT_AMBULATORY_CARE_PROVIDER_SITE_OTHER): Payer: Non-veteran care

## 2016-05-27 DIAGNOSIS — M5442 Lumbago with sciatica, left side: Secondary | ICD-10-CM

## 2016-05-27 MED ORDER — TRAMADOL HCL 50 MG PO TABS: 50.0000 mg | ORAL_TABLET | Freq: Four times a day (QID) | ORAL | 0 refills | Status: DC | PRN

## 2016-05-27 MED ORDER — KETOROLAC TROMETHAMINE 60 MG/2ML IM SOLN
INTRAMUSCULAR | Status: AC
  Filled 2016-05-27: qty 2

## 2016-05-27 MED ORDER — METHYLPREDNISOLONE SODIUM SUCC 125 MG IJ SOLR
INTRAMUSCULAR | Status: AC
  Filled 2016-05-27: qty 2

## 2016-05-27 MED ORDER — METAXALONE 800 MG PO TABS: 800.0000 mg | ORAL_TABLET | Freq: Three times a day (TID) | ORAL | 0 refills | Status: DC

## 2016-05-27 MED ORDER — KETOROLAC TROMETHAMINE 60 MG/2ML IM SOLN
60.0000 mg | Freq: Once | INTRAMUSCULAR | Status: AC
  Administered 2016-05-27: 60 mg via INTRAMUSCULAR

## 2016-05-27 MED ORDER — DICLOFENAC SODIUM 75 MG PO TBEC: 75.0000 mg | DELAYED_RELEASE_TABLET | Freq: Two times a day (BID) | ORAL | 0 refills | Status: DC

## 2016-05-27 MED ORDER — PREDNISONE 10 MG (21) PO TBPK: ORAL_TABLET | ORAL | 0 refills | Status: DC

## 2016-05-27 MED ORDER — METHYLPREDNISOLONE SODIUM SUCC 125 MG IJ SOLR
125.0000 mg | Freq: Once | INTRAMUSCULAR | Status: AC
  Administered 2016-05-27: 125 mg via INTRAMUSCULAR

## 2016-05-27 NOTE — ED Provider Notes (Signed)
HPI  SUBJECTIVE:  Margaret Cameron is a 36 y.o. female who presents with acute onset of bilateral low back pain starting this am. She states that she woke up this morning and states that her back feels like "one big knot". She reports the pain as dull, throbbing, constant and becomes sharp with bending forward and certain movements. She states that her left leg is not when she sits for too long. This is new. She reports having similar back pain with her back strain several weeks ago. 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 She has tried warm compresses, Theragesic ointment and 2 tablets of Aleve. Warm compresses seem to help the most. Denies N/V, fevers, flank pain, abdominal pain, urinary urgency, frequency, dysuria, cloudy or odorous urine, hematuria.  No syncope. No saddle anesthesia, distal weakness, bilateral radicular leg pain/weakness, fevers/night sweats, recent h/o trauma,  bladder/ bowel incontinence, urinary retention, h/o CA / multiple myleoma, unexplained weight loss, pain worse at night,  h/o prolonged steroid use, h/o osteopenia, h/o IVDU, h/o HIV,  known AAA.  States feels similar to previous episodes of back pain. no h/o pyelonephritis, nephrolithiasis, DM, HTN. GIB, kidney disease. LMP: 12/27 no chance of pregnancy. PMD: [facility]  H/o UTI,    Past Medical History:  Diagnosis Date  . Abnormal Pap smear of cervix    LEEP 2011  . Anemia    with pregnancy  . Chronic headaches   . Cyst near coccyx   . Hypoglycemia   . Migraine   . Pilonidal cyst   . Pyelonephritis    x 3 episodes  . Sickle cell trait [facility])     Past Surgical History:  Procedure Laterality Date  . DILATION AND CURETTAGE OF UTERUS  2008  . LEEP  2011  . OTHER SURGICAL HISTORY     Pynlodial cyst removed from tail bone 2012  . OTHER SURGICAL HISTORY  10/2010   removal of cyst near coxxyx  . PILONIDAL CYST EXCISION  10/2010    Family History  Problem Relation Age of Onset  . Hypertension Brother    . Cancer Maternal Aunt     breast    Social History  Substance Use Topics  . Smoking status: Former Smoker    Packs/day: 0.10    Types: Cigarettes    Quit date: 07/14/2014  . Smokeless tobacco: Never Used  . Alcohol use No    No current facility-administered medications for this encounter.   Current Outpatient Prescriptions:  .  diclofenac (VOLTAREN) 75 MG EC tablet, Take 1 tablet (75 mg total) by mouth 2 (two) times daily. Take with food, Disp: 30 tablet, Rfl: 0 .  metaxalone (SKELAXIN) 800 MG tablet, Take 1 tablet (800 mg total) by mouth 3 (three) times daily., Disp: 21 tablet, Rfl: 0 .  predniSONE (STERAPRED UNI-PAK 21 TAB) 10 MG (21) TBPK tablet, Dispense one 6 day pack. Take as directed with food., Disp: 21 tablet, Rfl: 0 .  traMADol (ULTRAM) 50 MG tablet, Take 1 tablet (50 mg total) by mouth every 6 (six) hours as needed., Disp: 20 tablet, Rfl: 0  Allergies  Allergen Reactions  . Codeine Rash  . Septra [Bactrim] Rash     ROS  As noted in HPI.   Physical Exam  BP 115/70 (BP Location: Left Arm)   Pulse 72   Temp 98.1 F (36.7 C) (Oral)   Resp 16   LMP 05/18/2016   SpO2 100%   Constitutional: Well developed, well nourished, Appears very  uncomfortable. Eyes:  EOMI, conjunctiva normal bilaterally HENT: Normocephalic, atraumatic,mucus membranes moist Respiratory: Normal inspiratory effort Cardiovascular: Normal rate GI: nondistended. No suprapubic tenderness skin: No rash over her back, skin intact Musculoskeletal: no CVAT. + Left paralumbar tenderness, + bilateral muscle spasm. No bony tenderness. Bilateral lower extremities nontender, baseline ROM with intact DP pulses. Pain aggravated with left hip flexion and extension against resistance. No pain with passive int/ext rotation hips bilaterally. SLR positive L side  Sensation baseline light touch bilaterally for Pt, DTR's symmetric and intact bilaterally KJ, Motor symmetric. 4/5 at the L hip flexion and  quadriceps, hanstrings. 5/5 equal bilaterally bilateral EHL, foot dorsiflexion, foot plantarflexion, gait somewhat antalgic but without apparent new ataxia. Neurologic: Alert & oriented x 3, no focal neuro deficits Psychiatric: Speech and behavior appropriate   ED Course   Medications  ketorolac (TORADOL) injection 60 mg (60 mg Intramuscular Given 05/27/16 1758)  methylPREDNISolone sodium succinate (SOLU-MEDROL) 125 mg/2 mL injection 125 mg (125 mg Intramuscular Given 05/27/16 1758)    Orders Placed This Encounter  Procedures  . DG Lumbar Spine Complete    Standing Status:   Standing    Number of Occurrences:   1    Order Specific Question:   Reason for Exam (SYMPTOM  OR DIAGNOSIS REQUIRED)    Answer:   low back pain r/o acute changes  . Ambulatory referral to Physical Therapy    Referral Priority:   Urgent    Referral Type:   Physical Medicine    Referral Reason:   Specialty Services Required    Requested Specialty:   Physical Therapy    Number of Visits Requested:   1    No results found for this or any previous visit (from the past 24 hour(s)). Dg Lumbar Spine Complete  Result Date: 05/27/2016 CLINICAL DATA:  Low back pain EXAM: LUMBAR SPINE - COMPLETE 4+ VIEW COMPARISON:  03/24/2016 FINDINGS: Normal alignment. Disc spaces are normal. Negative for fracture or pars defect. SI joints normal. IUD noted in the pelvis. IMPRESSION: Negative. Electronically Signed   By: Franchot Gallo M.D.   On: 05/27/2016 18:32    ED Clinical Impression  Acute left-sided low back pain with left-sided sciatica  ED Assessment/Plan  [place] narcotic database reviewed. Pt with no narcotic rx in the past 6 months.  Giving Toradol 60 mg IM and Solu-Medrol 125 mg IM. Obtaining lumbar films given the new numbness.   Reviewed imaging independently. No fracture or dislocation. SI joints normal. See radiology report for details   Pt with  reduction in pain after pain meds. No new findings on re-exam. Pt  ambulatory in the ED. Home with diclofenac, tramadol, Skelaxin, steriod. Pt to f/u with  PT and PMD.  Discussed imaging, medical decision-making, and plan for follow-up with the patient.  Discussed signs and symptoms that should prompt return to the emergency department.  Patient agrees with plan.   *This clinic note was created using Dragon dictation software. Therefore, there may be occasional mistakes despite careful proofreading.  ?    Melynda Ripple, MD 05/27/16 [phone]

## 2016-05-27 NOTE — ED Triage Notes (Signed)
Here for back pain onset today when she woke up  Pain increases w/activity  Reports she 2 weeks ago she was moving heavy packages at work and strained her back muscles  Saw PCP at [place] in [place]... Was treated for muscle strain... X-ray showed mild arthritis  A&O x4... NAD

## 2016-05-27 NOTE — Discharge Instructions (Signed)
Take the NSAID on a regular basis for the next 10 days. tramadol for severe pain only. many people find gentle stretching and deep tissue massage helpful. Try [facility] on [address]. They have very reasonable prices and take walk ins. Or you can go to  [facility]. Follow-up with your primary care physician in several days, go to the ER for the signs and symptoms we discussed. °

## 2016-05-30 ENCOUNTER — Telehealth (HOSPITAL_COMMUNITY): Payer: Self-pay | Admitting: Emergency Medicine

## 2016-05-30 NOTE — Telephone Encounter (Signed)
Patient arrived at [facility] requesting a work note, thought note in packet from visit on Friday 1/5.  Spoke to dr honig.  Agreed to a note to return to work today.

## 2016-07-21 ENCOUNTER — Ambulatory Visit (HOSPITAL_COMMUNITY)
Admission: RE | Admit: 2016-07-21 | Discharge: 2016-07-21 | Disposition: A | Discharge status: Home / Self Care | Payer: Self-pay | Attending: Psychiatry | Admitting: Psychiatry

## 2016-07-21 DIAGNOSIS — F410 Panic disorder [episodic paroxysmal anxiety] without agoraphobia: Secondary | ICD-10-CM | POA: Insufficient documentation

## 2016-07-21 NOTE — H&P (Signed)
Behavioral Health Medical Screening Exam  Margaret Cameron is an 36 y.o. female.  Total Time spent with patient: 15 minutes  Psychiatric Specialty Exam: Physical Exam  Constitutional: She is oriented to person, place, and time. She appears well-developed and well-nourished.  HENT:  Head: Normocephalic and atraumatic.  Right Ear: External ear normal.  Left Ear: External ear normal.  Neck: Normal range of motion.  Cardiovascular: Normal rate and normal heart sounds.   Respiratory: Effort normal and breath sounds normal.  GI: Soft. Bowel sounds are normal.  Musculoskeletal: Normal range of motion.  Neurological: She is alert and oriented to person, place, and time.  Skin: Skin is warm and dry.    Review of Systems  Psychiatric/Behavioral: Negative for depression, hallucinations, memory loss, substance abuse and suicidal ideas. The patient is nervous/anxious. The patient does not have insomnia.     Blood pressure 120/69, pulse 64, temperature 98.5 F (36.9 C), temperature source Oral, resp. rate 18, SpO2 100 %.There is no height or weight on file to calculate BMI.  General Appearance: Casual and Fairly Groomed  Eye Contact:  Good  Speech:  Clear and Coherent and Normal Rate  Volume:  Normal  Mood:  Anxious  Affect:  Congruent  Thought Process:  Coherent, Goal Directed and Linear  Orientation:  Full (Time, Place, and Person)  Thought Content:  Logical  Suicidal Thoughts:  No  Homicidal Thoughts:  No  Memory:  Immediate;   Good Recent;   Good Remote;   Fair  Judgement:  Good  Insight:  Good  Psychomotor Activity:  Normal  Concentration: Concentration: Good and Attention Span: Good  Recall:  Good  Fund of Knowledge:Good  Language: Good  Akathisia:  No  Handed:  Right  AIMS (if indicated):     Assets:  Communication Skills Desire for Improvement Financial Resources/Insurance Housing Intimacy Leisure Time Physical Health Resilience Social  Support Vocational/Educational  Sleep:       Musculoskeletal: Strength & Muscle Tone: within normal limits Gait & Station: normal Patient leans: N/A  Blood pressure 120/69, pulse 64, temperature 98.5 F (36.9 C), temperature source Oral, resp. rate 18, SpO2 100 %.  Recommendations:  Based on my evaluation the patient does not appear to have an emergency medical condition.  Ethelene Hal, NP 07/21/2016, 1:41 PM

## 2016-07-21 NOTE — BH Assessment (Signed)
Tele Assessment Note   Margaret Cameron is an 36 y.o. female who presents voluntarily  reporting symptoms of anxiety. She states that she has panic attacks when she is driving down hills, which have worsened in the past week. She denies current stressors and does not know why this happened now, but says the symptoms originated from [place] and it [place] to the side. 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 She had treatment for anxiety in 2010, which helped. She sleeps 3-4 hours per day due to her job at [employer] which starts ay 4 am. She serves in the [employer], but is getting ready to leave the service.  Pt lives with her husband and two sons, and supports include her whole family. Pt denies history of abuse and trauma.   Pt has good insight and judgment. Pt's memory is normal. Pt denies legal history. ? Pt has no IP history. ? MSE: Pt is casually dressed, alert, oriented x4 with normal speech and normal motor behavior. Eye contact is good. Pt's mood is anxious, and affect is congruent with mood. Thought process is coherent and relevant. There is no indication Pt is currently responding to internal stimuli or experiencing delusional thought content. Pt was cooperative throughout assessment. Pt is currently unable to contract for safety outside the hospital and wants inpatient psychiatric treatment.  Jinny Blossom recommends OP treatment and the only option currently is the [place] for covered OP services. Explained to patient, and she will contact [place].    Diagnosis: Panic Disorder  Past Medical History:  Past Medical History:  Diagnosis Date  . Abnormal Pap smear of cervix    LEEP 2011  . Anemia    with pregnancy  . Chronic headaches   . Cyst near coccyx   . Hypoglycemia   . Migraine   . Pilonidal cyst   . Pyelonephritis    x 3 episodes  . Sickle cell trait [facility])     Past Surgical History:  Procedure  Laterality Date  . DILATION AND CURETTAGE OF UTERUS  2008  . LEEP  2011  . OTHER SURGICAL HISTORY     Pynlodial cyst removed from tail bone 2012  . OTHER SURGICAL HISTORY  10/2010   removal of cyst near coxxyx  . PILONIDAL CYST EXCISION  10/2010    Family History:  Family History  Problem Relation Age of Onset  . Hypertension Brother   . Cancer Maternal Aunt     breast    Social History:  reports that she quit smoking about 2 years ago. Her smoking use included Cigarettes. She smoked 0.10 packs per day. She has never used smokeless tobacco. She reports that she does not drink alcohol or use drugs.  Additional Social History:  Alcohol / Drug Use Pain Medications: denies Prescriptions: denies Over the Counter: denies History of alcohol / drug use?: Yes (hx of marijuana use) Longest period of sobriety (when/how long): sober 4 years Negative Consequences of Use:  (none currently) Withdrawal Symptoms:  (denies)  CIWA: CIWA-Ar BP: 120/69 Pulse Rate: 64 COWS:    PATIENT STRENGTHS: (choose at least two) Ability for insight Active sense of humor Average or above average intelligence Capable of independent living Communication skills Financial means General fund of knowledge Motivation for treatment/growth Physical Health Special hobby/interest Supportive family/friends Work skills  Allergies:  Allergies  Allergen Reactions  . Codeine Rash  .  Septra [Bactrim] Rash    Home Medications:  (Not in a hospital admission)  OB/GYN Status:  No LMP recorded.  General Assessment Data Location of Assessment: [facility] Assessment Services TTS Assessment: In system Is this a Tele or Face-to-Face Assessment?: Face-to-Face Is this an Initial Assessment or a Re-assessment for this encounter?: Initial Assessment Marital status: Married Is patient pregnant?: Unknown Pregnancy Status: Unknown Living Arrangements: Spouse/significant other, Children Can pt return to current living  arrangement?: Yes Admission Status: Voluntary Is patient capable of signing voluntary admission?: Yes Referral Source: Self/Family/Friend Insurance type: VA  Medical Screening Exam ([place]) Medical Exam completed: Yes  Crisis Care Plan Living Arrangements: Spouse/significant other, Children Name of Psychiatrist:  (none) Name of Therapist: none  Education Status Is patient currently in school?: No  Risk to self with the past 6 months Suicidal Ideation: No Has patient been a risk to self within the past 6 months prior to admission? : No Suicidal Intent: No Has patient had any suicidal intent within the past 6 months prior to admission? : No Is patient at risk for suicide?: Yes Suicidal Plan?: No Has patient had any suicidal plan within the past 6 months prior to admission? : No Access to Means: Yes Specify Access to Suicidal Means: gun in home (but locked up) What has been your use of drugs/alcohol within the last 12 months?: denies Previous Attempts/Gestures: No Other Self Harm Risks: none Intentional Self Injurious Behavior: None Family Suicide History: Unknown Recent stressful life event(s):  (panic attacks) Persecutory voices/beliefs?: No Depression: No Depression Symptoms:  (denies) Substance abuse history and/or treatment for substance abuse?: Yes Suicide prevention information given to non-admitted patients: Not applicable  Risk to Others within the past 6 months Homicidal Ideation: No Does patient have any lifetime risk of violence toward others beyond the six months prior to admission? : No Thoughts of Harm to Others: No Current Homicidal Intent: No Current Homicidal Plan: No Access to Homicidal Means: No History of harm to others?: No Assessment of Violence: None Noted Does patient have access to weapons?: Yes (Comment) (guns locked up) Criminal Charges Pending?: No Does patient have a court date: No Is patient on probation?:  No  Psychosis Hallucinations: None noted Delusions: None noted  Mental Status Report Appearance/Hygiene: Unremarkable Eye Contact: Good Motor Activity: Restlessness Speech: Logical/coherent, Rapid Level of Consciousness: Alert Mood: Anxious Affect: Anxious Anxiety Level: Panic Attacks Panic attack frequency: frequent--daily Most recent panic attack: today Thought Processes: Coherent, Relevant Judgement: Unimpaired Orientation: Person, Place, Time, Situation, Appropriate for developmental age Obsessive Compulsive Thoughts/Behaviors: None  Cognitive Functioning Concentration: Fair Memory: Recent Intact, Remote Intact IQ: Average Insight: Good Impulse Control: Good Appetite: Poor Weight Loss:  (unk) Weight Gain:  (0) Sleep: Decreased Total Hours of Sleep: 3 Vegetative Symptoms: None  ADLScreening [facility] Assessment Services) Patient's cognitive ability adequate to safely complete daily activities?: No Patient able to express need for assistance with ADLs?: Yes Independently performs ADLs?: Yes (appropriate for developmental age)  Prior Inpatient Therapy Prior Inpatient Therapy: No  Prior Outpatient Therapy Prior Outpatient Therapy: Yes Prior Therapy Dates: 2010 Prior Therapy Facilty/Provider(s): saw a counselor for 1 week Reason for Treatment: anxiety Does patient have an ACCT team?: No Does patient have Intensive In-House Services?  : No Does patient have [facility] services? : No Does patient have P4CC services?: No  ADL Screening (condition at time of admission) Patient's cognitive ability adequate to safely complete daily activities?: No Patient able to express need for assistance with ADLs?: Yes  Independently performs ADLs?: Yes (appropriate for developmental age)       Abuse/Neglect Assessment (Assessment to be complete while patient is alone) Physical Abuse: Denies Verbal Abuse: Denies Sexual Abuse: Denies Exploitation of patient/patient's resources:  Denies Self-Neglect: Denies Values / Beliefs Cultural Requests During Hospitalization: None Spiritual Requests During Hospitalization: None Consults Spiritual Care Consult Needed: No Social Work Consult Needed: No Regulatory affairs officer (For Healthcare) Does Patient Have a Medical Advance Directive?: No    Additional Information 1:1 In Past 12 Months?: No CIRT Risk: No Elopement Risk: No Does patient have medical clearance?: No     Disposition:  Disposition Initial Assessment Completed for this Encounter: Yes Disposition of Patient: Outpatient treatment Type of outpatient treatment: Adult ([place])  [place] 07/21/2016 2:19 PM

## 2016-09-13 ENCOUNTER — Encounter (HOSPITAL_COMMUNITY): Payer: Self-pay

## 2016-09-13 ENCOUNTER — Emergency Department (HOSPITAL_COMMUNITY)
Admission: EM | Admit: 2016-09-13 | Discharge: 2016-09-13 | Disposition: A | Discharge status: Home / Self Care | Payer: Self-pay | Attending: Emergency Medicine | Admitting: Emergency Medicine

## 2016-09-13 ENCOUNTER — Emergency Department (HOSPITAL_COMMUNITY): Payer: Self-pay

## 2016-09-13 DIAGNOSIS — R6884 Jaw pain: Secondary | ICD-10-CM | POA: Insufficient documentation

## 2016-09-13 DIAGNOSIS — Z87891 Personal history of nicotine dependence: Secondary | ICD-10-CM | POA: Insufficient documentation

## 2016-09-13 LAB — I-STAT BETA HCG BLOOD, ED (MC, WL, AP ONLY): I-stat hCG, quantitative: <5 m[IU]/mL (ref <5)

## 2016-09-13 LAB — SEDIMENTATION RATE: Sed Rate: 6 mm/hr (ref 0–22)

## 2016-09-13 MED ORDER — CYCLOBENZAPRINE HCL 10 MG PO TABS: 10.0000 mg | ORAL_TABLET | Freq: Two times a day (BID) | ORAL | 0 refills | Status: DC | PRN

## 2016-09-13 MED ORDER — KETOROLAC TROMETHAMINE 30 MG/ML IJ SOLN
30.0000 mg | Freq: Once | INTRAMUSCULAR | Status: AC
  Administered 2016-09-13: 30 mg via INTRAMUSCULAR
  Filled 2016-09-13: qty 1

## 2016-09-13 NOTE — ED Notes (Signed)
Pt is back from x-ray

## 2016-09-13 NOTE — ED Provider Notes (Signed)
[place] DEPT [facility] Provider Note   CSN: [id number] Arrival date & time: 09/13/16  2549     History   Chief Complaint Chief Complaint  Patient presents with  . Jaw Pain    pt with R sided jaw pain that started on Friday no injury to area     HPI Margaret Cameron is a 36 y.o. female presents with right-sided jaw pain that began 4 days ago. She describes the pain as an "ache" and states it radiates to the upper right ear. She states that pain is significantly worse with opening and closing her mouth and with eating food. She reports difficulty tolerating PO secondary to difficulty opening and closing her mouth but state she has managed to eat and drink. She has been able to tolerate her secretions. She has taken Tylenol and ibuprofen orally for pain. She has also used warm compresses with no relief of pain. She reports associated mild headache. Patient states that she does grind her teeth at night but does not wear a mouthguard. She denies any fever or dental pain, difficulty breathing. She denies any trauma or injury to face.    The history is provided by the patient.    Past Medical History:  Diagnosis Date  . Abnormal Pap smear of cervix    LEEP 2011  . Anemia    with pregnancy  . Chronic headaches   . Cyst near coccyx   . Hypoglycemia   . Migraine   . Pilonidal cyst   . Pyelonephritis    x 3 episodes  . Sickle cell trait [facility])     Patient Active Problem List   Diagnosis Date Noted  . SAB (spontaneous abortion) in 2nd trimester 2012--16 weeks, PROM, induced 02/25/2013  . Two previous spontaneous abortions (SAB) affecting care of mother, antepartum 02/25/2013  . GBS (group B streptococcus) UTI complicating pregnancy 82/64/1583  . E-coli UTI 02/25/2013  . Vaginal delivery 02/24/2013  . Hx LEEP (loop electrosurgical excision procedure), cervix, pregnancy 12/09/2012  . Hx of pyelonephritis 12/09/2012  . Anemia 12/09/2012  . Pilonidal cyst without abscess 08/29/2011     Past Surgical History:  Procedure Laterality Date  . DILATION AND CURETTAGE OF UTERUS  2008  . LEEP  2011  . OTHER SURGICAL HISTORY     Pynlodial cyst removed from tail bone 2012  . OTHER SURGICAL HISTORY  10/2010   removal of cyst near coxxyx  . PILONIDAL CYST EXCISION  10/2010    OB History    Gravida Para Term Preterm AB Living   '5 2 2 ' 0 3 2   SAB TAB Ectopic Multiple Live Births   2 1 0 0 2       Home Medications    Prior to Admission medications   Medication Sig Start Date End Date Taking? Authorizing Provider  cyclobenzaprine (FLEXERIL) 10 MG tablet Take 1 tablet (10 mg total) by mouth 2 (two) times daily as needed for muscle spasms. 09/13/16   Volanda Napoleon, PA-C  diclofenac (VOLTAREN) 75 MG EC tablet Take 1 tablet (75 mg total) by mouth 2 (two) times daily. Take with food 05/27/16   Melynda Ripple, MD  metaxalone (SKELAXIN) 800 MG tablet Take 1 tablet (800 mg total) by mouth 3 (three) times daily. 05/27/16   Melynda Ripple, MD  predniSONE (STERAPRED UNI-PAK 21 TAB) 10 MG (21) TBPK tablet Dispense one 6 day pack. Take as directed with food. 05/27/16   Melynda Ripple, MD  traMADol (ULTRAM) 50 MG tablet  Take 1 tablet (50 mg total) by mouth every 6 (six) hours as needed. 05/27/16   Melynda Ripple, MD    Family History Family History  Problem Relation Age of Onset  . Hypertension Brother   . Cancer Maternal Aunt     breast    Social History Social History  Substance Use Topics  . Smoking status: Former Smoker    Packs/day: 0.10    Types: Cigarettes    Quit date: 07/14/2014  . Smokeless tobacco: Never Used  . Alcohol use No     Allergies   Codeine and Septra [bactrim]   Review of Systems Review of Systems  Constitutional: Negative for fever.  HENT: Negative for dental problem, facial swelling and trouble swallowing.        +Jaw Pain  Respiratory: Negative for shortness of breath.   Neurological: Positive for headaches.  All other systems reviewed  and are negative.    Physical Exam Updated Vital Signs BP 113/62 (BP Location: Left Arm)   Pulse (!) 59   Temp 98.3 F (36.8 C) (Oral)   Resp 18   Ht '5\' 6"'  (1.676 m)   Wt 72.6 kg   SpO2 100%   BMI 25.82 kg/m   Physical Exam  Constitutional: She appears well-developed and well-nourished.  HENT:  Head: Normocephalic and atraumatic.  Right Ear: Tympanic membrane normal.  Left Ear: Tympanic membrane normal.  Mouth/Throat: Oropharynx is clear and moist and mucous membranes are normal. No trismus in the jaw. Normal dentition. No uvula swelling.  No evidence of jaw dislocation. Muscular spasm noted over bilateral jaw. No deformity or crepitus noted. Patient with slight trismus but is able to almost depress mandible. No gingival swelling or dental abscess noted. No difficulty talking. No tenderness to palpation to the right temporal area.   Eyes: Conjunctivae and EOM are normal. Right eye exhibits no discharge. Left eye exhibits no discharge. No scleral icterus.  Neck: Full passive range of motion without pain. Neck supple. No neck rigidity.  Pulmonary/Chest: Effort normal. No accessory muscle usage. No respiratory distress.  Musculoskeletal: She exhibits no deformity.  Lymphadenopathy:       Head (right side): No submental and no submandibular adenopathy present.       Head (left side): No submental and no submandibular adenopathy present.    She has no cervical adenopathy.  Neurological: She is alert.  Skin: Skin is warm and dry.  Psychiatric: She has a normal mood and affect. Her speech is normal and behavior is normal.     ED Treatments / Results  Labs (all labs ordered are listed, but only abnormal results are displayed) Labs Reviewed  SEDIMENTATION RATE  I-STAT BETA HCG BLOOD, ED ([facility], [facility], [facility] ONLY)    EKG  EKG Interpretation None       Radiology Dg Orthopantogram  Result Date: 09/13/2016 CLINICAL DATA:  Right-sided jaw pain radiating to the ear with swelling  since Friday. No history of injury. EXAM: ORTHOPANTOGRAM/PANORAMIC COMPARISON:  None. FINDINGS: The mandible appears intact and symmetrically located. No erosion or lesion noted. No noted tooth devitalization. Symmetric partial visualization of maxillary sinuses. IMPRESSION: Negative exam. Electronically Signed   By: Monte Fantasia M.D.   On: 09/13/2016 09:31    Procedures Procedures (including critical care time)  Medications Ordered in ED Medications  ketorolac (TORADOL) 30 MG/ML injection 30 mg (30 mg Intramuscular Given 09/13/16 0936)     Initial Impression / Assessment and Plan / ED Course  I have reviewed the  triage vital signs and the nursing notes.  Pertinent labs & imaging results that were available during my care of the patient were reviewed by me and considered in my medical decision making (see chart for details).    36 year old female presents with 4 days of worsening right jaw pain. Patient is afebrile, non-toxic appearing, sitting comfortably on examination table. History of teeth grinding at night but does not wear a mouthguard. No preceding trauma or injury. Pain not relieved with Tylenol or warm compresses. Physical exam with no evidence of dislocation or deformity. Slight trismus but is able to almost fully depress mandible. No evidence of dental abscess on exam.Consider TMJ vs muscular strain vs trigeminal neuralgia. Low suspicion for dislocation/fracture given history/exam. Unlikely great cell arteritis given history/physical exam. 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 Will give analgesic in the department. Will obtain XR of face for eval of any jaw fracture vs dislocation. Will also check ESR to evaluate for any inflammation.   Toradol given in the department.   XR reviewed. Negative for any acute fracture. Results discussed with patient. She reports mild improvement of pain with use of Toradol and feels like it has helped her ROM.  Patient states that she does have a dentist that she sees regularly. Instructed her to call her dentists and arrange for a follow-up appointment in the next 24-48 hours. Patient stable for discharge at this time. Strict return precautions given. Patient expresses understanding and agreement to plan.   Final Clinical Impressions(s) / ED Diagnoses   Final diagnoses:  Jaw pain    New Prescriptions Discharge Medication List as of 09/13/2016 12:00 PM    START taking these medications   Details  cyclobenzaprine (FLEXERIL) 10 MG tablet Take 1 tablet (10 mg total) by mouth 2 (two) times daily as needed for muscle spasms., Starting Tue 09/13/2016, Print         Volanda Napoleon, PA-C 09/15/16 2353    Merrily Pew, MD 09/18/16 [phone]

## 2016-09-13 NOTE — Discharge Instructions (Signed)
Follow-up with your dentist. Call tomorrow and arrange for an appointment.   Take medication as needed for relief of symptoms.   Return to the Emergency Department for any worsening pain, fevers, increased drooling or any other concerns.    If you do not have a primary care doctor you see regularly, please you the list below. Please call them to arrange for follow-up.    No Primary Care Doctor Call [place] Other agencies that provide inexpensive medical care    [place]  [phone]    [facility] Internal Medicine  [place]  [phone]    [facility]  [phone]    [facility]  [phone]    [facility]  [phone]

## 2016-10-13 ENCOUNTER — Encounter (HOSPITAL_COMMUNITY): Payer: Self-pay | Admitting: Emergency Medicine

## 2016-10-13 ENCOUNTER — Emergency Department (HOSPITAL_COMMUNITY)
Admission: EM | Admit: 2016-10-13 | Discharge: 2016-10-14 | Disposition: A | Discharge status: Home / Self Care | Payer: Non-veteran care | Attending: Emergency Medicine | Admitting: Emergency Medicine

## 2016-10-13 DIAGNOSIS — Z791 Long term (current) use of non-steroidal anti-inflammatories (NSAID): Secondary | ICD-10-CM | POA: Insufficient documentation

## 2016-10-13 DIAGNOSIS — G8929 Other chronic pain: Secondary | ICD-10-CM | POA: Insufficient documentation

## 2016-10-13 DIAGNOSIS — Z79899 Other long term (current) drug therapy: Secondary | ICD-10-CM | POA: Insufficient documentation

## 2016-10-13 DIAGNOSIS — L02412 Cutaneous abscess of left axilla: Secondary | ICD-10-CM | POA: Insufficient documentation

## 2016-10-13 DIAGNOSIS — Z87891 Personal history of nicotine dependence: Secondary | ICD-10-CM | POA: Insufficient documentation

## 2016-10-13 MED ORDER — LIDOCAINE-EPINEPHRINE (PF) 2 %-1:200000 IJ SOLN
20.0000 mL | Freq: Once | INTRAMUSCULAR | Status: AC
  Administered 2016-10-14: 20 mL
  Filled 2016-10-13: qty 20

## 2016-10-13 NOTE — ED Notes (Signed)
I&D tray and ordered lidocaine at bedside

## 2016-10-13 NOTE — ED Triage Notes (Signed)
Pt reports abscess to left axilla for the last two weeks. Pain with movement reported. Pt reports prior hx of abscess in same area.

## 2016-10-14 MED ORDER — DOXYCYCLINE HYCLATE 100 MG PO CAPS
100.0000 mg | ORAL_CAPSULE | Freq: Two times a day (BID) | ORAL | 0 refills | Status: DC
Start: 2016-10-14 — End: 2017-08-29

## 2016-10-14 MED ORDER — TRAMADOL HCL 50 MG PO TABS: 50.0000 mg | ORAL_TABLET | Freq: Four times a day (QID) | ORAL | 0 refills | Status: DC | PRN

## 2016-10-14 NOTE — ED Provider Notes (Signed)
[place] DEPT Provider Note   CSN: [id number] Arrival date & time: 10/13/16  2252     History   Chief Complaint Chief Complaint  Patient presents with  . Abscess    HPI Margaret Cameron is a 36 y.o. female.  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 The past 2 weeks she has noticed increasing pain and swelling to the left axillary fold similar to prior abscess. She has tried using warm compress, and over-the-counter medication without adequate relief. Pain worse with movement or with palpation. No associated chest pain or short of breath. She is up-to-date with tetanus. She denies any fever. She denies any injury. No history of diabetes.  Past Medical History:  Diagnosis Date  . Abnormal Pap smear of cervix    LEEP 2011  . Anemia    with pregnancy  . Chronic headaches   . Cyst near coccyx   . Hypoglycemia   . Migraine   . Pilonidal cyst   . Pyelonephritis    x 3 episodes  . Sickle cell trait [facility])     Patient Active Problem List   Diagnosis Date Noted  . SAB (spontaneous abortion) in 2nd trimester 2012--16 weeks, PROM, induced 02/25/2013  . Two previous spontaneous abortions (SAB) affecting care of mother, antepartum 02/25/2013  . GBS (group B streptococcus) UTI complicating pregnancy 09/73/5329  . E-coli UTI 02/25/2013  . Vaginal delivery 02/24/2013  . Hx LEEP (loop electrosurgical excision procedure), cervix, pregnancy 12/09/2012  . Hx of pyelonephritis 12/09/2012  . Anemia 12/09/2012  . Pilonidal cyst without abscess 08/29/2011    Past Surgical History:  Procedure Laterality Date  . DILATION AND CURETTAGE OF UTERUS  2008  . LEEP  2011  . OTHER SURGICAL HISTORY     Pynlodial cyst removed from tail bone 2012  . OTHER SURGICAL HISTORY  10/2010   removal of cyst near coxxyx  . PILONIDAL CYST EXCISION  10/2010    OB History    Gravida Para Term Preterm AB Living   5 2 2  0 3 2   SAB TAB Ectopic Multiple Live Births   2 1 0 0 2       Home Medications    Prior to Admission medications   Medication Sig Start Date End Date Taking? Authorizing Provider  cyclobenzaprine (FLEXERIL) 10 MG tablet Take 1 tablet (10 mg total) by mouth 2 (two) times daily as needed for muscle spasms. 09/13/16   Volanda Napoleon, PA-C  diclofenac (VOLTAREN) 75 MG EC tablet Take 1 tablet (75 mg total) by mouth 2 (two) times daily. Take with food 05/27/16   Melynda Ripple, MD  metaxalone (SKELAXIN) 800 MG tablet Take 1 tablet (800 mg total) by mouth 3 (three) times daily. 05/27/16   Melynda Ripple, MD  predniSONE (STERAPRED UNI-PAK 21 TAB) 10 MG (21) TBPK tablet Dispense one 6 day pack. Take as directed with food. 05/27/16   Melynda Ripple, MD  traMADol (ULTRAM) 50 MG tablet Take 1 tablet (50 mg total) by mouth every 6 (six) hours as needed. 05/27/16   Melynda Ripple, MD    Family History Family History  Problem Relation Age of Onset  . Hypertension Brother   . Cancer Maternal Aunt        breast    Social History Social History  Substance Use Topics  . Smoking status: Former Smoker    Packs/day: 0.10    Types: Cigarettes  Quit date: 07/14/2014  . Smokeless tobacco: Never Used  . Alcohol use No     Allergies   Codeine and Septra [bactrim]   Review of Systems Review of Systems  Constitutional: Negative for fever.  Neurological: Negative for numbness.     Physical Exam Updated Vital Signs BP 124/83 (BP Location: Right Arm)   Pulse 70   Temp 98.9 F (37.2 C) (Oral)   Resp 18   Ht 5\' 6"  (1.676 m)   Wt 72.1 kg (159 lb)   LMP 10/04/2016 (Exact Date)   SpO2 99%   BMI 25.66 kg/m   Physical Exam  Constitutional: She appears well-developed and well-nourished. No distress.  HENT:  Head: Atraumatic.  Eyes: Conjunctivae are normal.  Neck: Neck supple.  Musculoskeletal: She exhibits tenderness (Left axillary region. An area of induration  approximately 2 cm in diameter, tender to palpation without any surrounding skin erythema.).  Neurological: She is alert.  Skin: No rash noted.  Psychiatric: She has a normal mood and affect.  Nursing note and vitals reviewed.    ED Treatments / Results  Labs (all labs ordered are listed, but only abnormal results are displayed) Labs Reviewed - No data to display  EKG  EKG Interpretation None       Radiology No results found.  Procedures Procedures (including critical care time)  INCISION AND DRAINAGE Performed by: Domenic Moras Consent: Verbal consent obtained. Risks and benefits: risks, benefits and alternatives were discussed Type: abscess  Body area: L axillary fold  Anesthesia: local infiltration  Incision was made with a scalpel.  Local anesthetic: lidocaine 2% w epinephrine  Anesthetic total: 4 ml  Complexity: complex Blunt dissection to break up loculations  Drainage: purulent  Drainage amount: moderate  Packing material: none  Patient tolerance: Patient tolerated the procedure well with no immediate complications.     Medications Ordered in ED Medications  lidocaine-EPINEPHrine (XYLOCAINE W/EPI) 2 %-1:200000 (PF) injection 20 mL (not administered)     Initial Impression / Assessment and Plan / ED Course  I have reviewed the triage vital signs and the nursing notes.  Pertinent labs & imaging results that were available during my care of the patient were reviewed by me and considered in my medical decision making (see chart for details).     BP 124/83 (BP Location: Right Arm)   Pulse 70   Temp 98.9 F (37.2 C) (Oral)   Resp 18   Ht 5\' 6"  (1.676 m)   Wt 72.1 kg (159 lb)   LMP 10/04/2016 (Exact Date)   SpO2 99%   BMI 25.66 kg/m    Final Clinical Impressions(s) / ED Diagnoses   Final diagnoses:  Abscess of axilla, left    New Prescriptions New Prescriptions   DOXYCYCLINE (VIBRAMYCIN) 100 MG CAPSULE    Take 1 capsule (100 mg  total) by mouth 2 (two) times daily. One po bid x 7 days   12:35 AM Abscess L axilla with successful I&D.  Wound care instruction given. Pt sts she isnot pregnant. Doxy prescribed.  Return in 48 hrs if no improvement.     Domenic Moras, PA-C 10/14/16 0036    Orpah Greek, MD 10/14/16 [phone]

## 2016-10-14 NOTE — ED Notes (Signed)
Pt ambulatory and independent at discharge.  Verbalized understanding of discharge instructions 

## 2017-05-09 ENCOUNTER — Other Ambulatory Visit: Payer: Self-pay

## 2017-05-09 ENCOUNTER — Emergency Department (HOSPITAL_COMMUNITY)
Admission: EM | Admit: 2017-05-09 | Discharge: 2017-05-09 | Disposition: A | Discharge status: Home / Self Care | Payer: Non-veteran care | Attending: Emergency Medicine | Admitting: Emergency Medicine

## 2017-05-09 ENCOUNTER — Encounter (HOSPITAL_COMMUNITY): Payer: Self-pay

## 2017-05-09 DIAGNOSIS — L0231 Cutaneous abscess of buttock: Secondary | ICD-10-CM | POA: Diagnosis not present

## 2017-05-09 DIAGNOSIS — L0291 Cutaneous abscess, unspecified: Secondary | ICD-10-CM

## 2017-05-09 DIAGNOSIS — Z87891 Personal history of nicotine dependence: Secondary | ICD-10-CM | POA: Diagnosis not present

## 2017-05-09 DIAGNOSIS — Z79899 Other long term (current) drug therapy: Secondary | ICD-10-CM | POA: Insufficient documentation

## 2017-05-09 MED ORDER — CEPHALEXIN 500 MG PO CAPS: 500.0000 mg | ORAL_CAPSULE | Freq: Four times a day (QID) | ORAL | 0 refills | Status: DC

## 2017-05-09 MED ORDER — LIDOCAINE-EPINEPHRINE (PF) 2 %-1:200000 IJ SOLN
10.0000 mL | Freq: Once | INTRAMUSCULAR | Status: AC
  Administered 2017-05-09: 10 mL
  Filled 2017-05-09: qty 20

## 2017-05-09 NOTE — ED Provider Notes (Signed)
[place] EMERGENCY DEPARTMENT Provider Note   CSN: [id number] Arrival date & time: 05/09/17  9518     History   Chief Complaint Chief Complaint  Patient presents with  . Abscess    HPI Margaret Cameron is a 36 y.o. female.  HPI 36 year old African American female past medical history significant for recurring abscesses presents to the emergency department today with nickel sized abscess to the left buttocks.  Patient states that is been there for 3 weeks and has progressively worsened.  Reports having to have this area drained several times in the past.  She states that she has tried warm compresses with no drainage.  Denies any associated fever, chills.  Denies any pain with defecation.  Denies any history of diabetes.  Patient has not taken anything for the pain prior to arrival. Past Medical History:  Diagnosis Date  . Abnormal Pap smear of cervix    LEEP 2011  . Anemia    with pregnancy  . Chronic headaches   . Cyst near coccyx   . Hypoglycemia   . Migraine   . Pilonidal cyst   . Pyelonephritis    x 3 episodes  . Sickle cell trait [facility])     Patient Active Problem List   Diagnosis Date Noted  . SAB (spontaneous abortion) in 2nd trimester 2012--16 weeks, PROM, induced 02/25/2013  . Two previous spontaneous abortions (SAB) affecting care of mother, antepartum 02/25/2013  . GBS (group B streptococcus) UTI complicating pregnancy 84/16/6063  . E-coli UTI 02/25/2013  . Vaginal delivery 02/24/2013  . Hx LEEP (loop electrosurgical excision procedure), cervix, pregnancy 12/09/2012  . Hx of pyelonephritis 12/09/2012  . Anemia 12/09/2012  . Pilonidal cyst without abscess 08/29/2011    Past Surgical History:  Procedure Laterality Date  . DILATION AND CURETTAGE OF UTERUS  2008  . LEEP  2011  . OTHER SURGICAL HISTORY     Pynlodial cyst removed from tail bone 2012  . OTHER SURGICAL HISTORY  10/2010   removal of cyst near coxxyx  . PILONIDAL CYST  EXCISION  10/2010    OB History    Gravida Para Term Preterm AB Living   5 2 2  0 3 2   SAB TAB Ectopic Multiple Live Births   2 1 0 0 2       Home Medications    Prior to Admission medications   Medication Sig Start Date End Date Taking? Authorizing Provider  cyclobenzaprine (FLEXERIL) 10 MG tablet Take 1 tablet (10 mg total) by mouth 2 (two) times daily as needed for muscle spasms. 09/13/16   Volanda Napoleon, PA-C  diclofenac (VOLTAREN) 75 MG EC tablet Take 1 tablet (75 mg total) by mouth 2 (two) times daily. Take with food 05/27/16   Melynda Ripple, MD  doxycycline (VIBRAMYCIN) 100 MG capsule Take 1 capsule (100 mg total) by mouth 2 (two) times daily. One po bid x 7 days 10/14/16   Domenic Moras, PA-C  metaxalone (SKELAXIN) 800 MG tablet Take 1 tablet (800 mg total) by mouth 3 (three) times daily. 05/27/16   Melynda Ripple, MD  predniSONE (STERAPRED UNI-PAK 21 TAB) 10 MG (21) TBPK tablet Dispense one 6 day pack. Take as directed with food. 05/27/16   Melynda Ripple, MD  traMADol (ULTRAM) 50 MG tablet Take 1 tablet (50 mg total) by mouth every 6 (six) hours as needed for moderate pain. 10/14/16   Domenic Moras, PA-C    Family History Family History  Problem Relation Age  of Onset  . Hypertension Brother   . Cancer Maternal Aunt        breast    Social History Social History   Tobacco Use  . Smoking status: Former Smoker    Packs/day: 0.10    Types: Cigarettes    Last attempt to quit: 07/14/2014    Years since quitting: 2.8  . Smokeless tobacco: Never Used  Substance Use Topics  . Alcohol use: No  . Drug use: No     Allergies   Codeine and Septra [bactrim]   Review of Systems Review of Systems  Constitutional: Negative for chills and fever.  Gastrointestinal: Negative for rectal pain and vomiting.  Musculoskeletal: Negative for myalgias.  Skin: Positive for wound.     Physical Exam Updated Vital Signs BP 131/66 (BP Location: Right Arm)   Pulse 80   Temp  98.2 F (36.8 C) (Oral)   Resp 17   Ht 5\' 6"  (1.676 m)   Wt 70.8 kg (156 lb)   SpO2 99%   BMI 25.18 kg/m   Physical Exam  Constitutional: She appears well-developed and well-nourished. No distress.  HENT:  Head: Normocephalic and atraumatic.  Eyes: Right eye exhibits no discharge. Left eye exhibits no discharge. No scleral icterus.  Neck: Normal range of motion.  Pulmonary/Chest: No respiratory distress.  Genitourinary:  Genitourinary Comments: Patient with approximately 2 cm area of induration to the left inner buttocks.  Does not extend into the rectum.  Mildly erythematous and warm to touch.  No purulent drainage.  Prior drainage site noted.  Mildly tender to palpation.  Musculoskeletal: Normal range of motion.  Neurological: She is alert.  Skin: No pallor.  Psychiatric: Her behavior is normal. Judgment and thought content normal.  Nursing note and vitals reviewed.    ED Treatments / Results  Labs (all labs ordered are listed, but only abnormal results are displayed) Labs Reviewed - No data to display  EKG  EKG Interpretation None       Radiology No results found.  Procedures .Marland KitchenIncision and Drainage Date/Time: 05/09/2017 11:05 AM Performed by: Doristine Devoid, PA-C Authorized by: Doristine Devoid, PA-C   Consent:    Consent obtained:  Verbal   Consent given by:  Patient   Risks discussed:  Bleeding, damage to other organs, infection, incomplete drainage and pain   Alternatives discussed:  No treatment Location:    Type:  Abscess   Location:  Anogenital   Anogenital location: buttocks. Pre-procedure details:    Skin preparation:  Chloraprep Anesthesia (see MAR for exact dosages):    Anesthesia method:  Local infiltration   Local anesthetic:  Lidocaine 1% WITH epi Procedure type:    Complexity:  Simple Procedure details:    Incision types:  Single straight   Incision depth:  Dermal   Scalpel blade:  11   Wound management:  Probed and  deloculated   Drainage:  Bloody and purulent   Drainage amount:  Scant   Wound treatment:  Wound left open   Packing materials:  None Post-procedure details:    Patient tolerance of procedure:  Tolerated well, no immediate complications   (including critical care time)  Medications Ordered in ED Medications - No data to display   Initial Impression / Assessment and Plan / ED Course  I have reviewed the triage vital signs and the nursing notes.  Pertinent labs & imaging results that were available during my care of the patient were reviewed by me and considered in  my medical decision making (see chart for details).     Patient with skin abscess amenable to incision and drainage.  Abscess was not large enough to warrant packing or drain,  wound recheck in 2 days. Encouraged home warm soaks and flushing.  Mild signs of cellulitis is surrounding skin.  Given location history will start on antibiotics.  Will d/c to home.   Pt is hemodynamically stable, in NAD, & able to ambulate in the ED. Evaluation does not show pathology that would require ongoing emergent intervention or inpatient treatment. I explained the diagnosis to the patient. Pain has been managed & has no complaints prior to dc. Pt is comfortable with above plan and is stable for discharge at this time. All questions were answered prior to disposition. Strict return precautions for f/u to the ED were discussed. Encouraged follow up with PCP.    Final Clinical Impressions(s) / ED Diagnoses   Final diagnoses:  None    ED Discharge Orders    None       Doristine Devoid, PA-C 05/09/17 1106    Mackuen, Fredia Sorrow, MD 05/09/17 1124

## 2017-05-09 NOTE — Discharge Instructions (Signed)
You have been treated for an abscess in the ED.  ° °There are sign of surrounding infection. Please take all of your antibiotics until finished!   You may develop abdominal discomfort or diarrhea from the antibiotic. You may help offset this with probiotics which you can buy or get in yogurt.  ° °May take tylenol and motrin as needed for pain. Warm compress to the area to help with the healing process. Avoid swimming for 1 week. May soak in warm water to help with pain and the healing process.  ° °Follow up with your doctor, an urgent care, or return to ED in order to remove your packing in 48-72 hours. If you do not have packing return in 48-72 hours for wound recheck. Return to the emergency department if you develop a fever, your abscess appears to become more infected (growing surrounding redness and warmth), new or worsening symptoms develop, any additional concerns.  ° °Abscess °An abscess (boil or furuncle) is an infected area that contains a collection of pus.  ° °SYMPTOMS °Signs and symptoms of an abscess include pain, tenderness, redness, or hardness. You may feel a moveable soft area under your skin. An abscess can occur anywhere in the body.  ° °TREATMENT  °A surgical cut (incision) may be made over your abscess to drain the pus. Gauze may be packed into the space or a drain may be looped through the abscess cavity (pocket). This provides a drain that will allow the cavity to heal from the inside outwards. The abscess may be painful for a few days, but should feel much better if it was drained.  °Your abscess, if seen early, may not have localized and may not have been drained. If not, another appointment may be required if it does not get better on its own or with medications. ° °HOME CARE INSTRUCTIONS  °Keep the skin and clothes clean around your abscess.  °If the abscess was drained, you will need to use gauze dressing to collect any draining pus. Dressings will typically need to be changed 3 or more  times a day.  °The infection may spread by skin contact with others. Avoid skin contact as much as possible.  °Practice good hygiene. This includes regular hand washing, cover any draining skin lesions, and do not share personal care items.  °SEEK MEDICAL CARE IF:  °You develop increased pain, swelling, redness, drainage, or bleeding in the wound site.  °You develop signs of generalized infection including muscle aches, chills, fever, or a general ill feeling.  °You have an oral temperature above 102° F (38.9° C).  °MAKE SURE YOU:  °Understand these instructions.  °Will watch your condition.  °Will get help right away if you are not doing well or get worse.  °Document Released: 02/16/2005 Document Revised: 01/19/2011 Document Reviewed: 12/11/2007 °[employer]® Patient Information ©2012 [employer]. ° ° ° °

## 2017-05-09 NOTE — ED Triage Notes (Signed)
PT reports having nickel sized abscess to left buttocks x ~3 weeks. PT states she has tried warm compress with no drainage. Denies fevers/chills.

## 2017-05-23 HISTORY — PX: CERVICAL CONIZATION W/BX: SHX1330

## 2017-08-28 ENCOUNTER — Encounter (HOSPITAL_COMMUNITY): Payer: Self-pay | Admitting: *Deleted

## 2017-08-28 ENCOUNTER — Other Ambulatory Visit: Payer: Self-pay

## 2017-08-28 ENCOUNTER — Emergency Department (HOSPITAL_COMMUNITY)
Admission: EM | Admit: 2017-08-28 | Discharge: 2017-08-29 | Disposition: A | Discharge status: Home / Self Care | Payer: Non-veteran care | Attending: Emergency Medicine | Admitting: Emergency Medicine

## 2017-08-28 ENCOUNTER — Emergency Department (HOSPITAL_COMMUNITY): Payer: Non-veteran care

## 2017-08-28 DIAGNOSIS — R531 Weakness: Secondary | ICD-10-CM | POA: Diagnosis not present

## 2017-08-28 DIAGNOSIS — R109 Unspecified abdominal pain: Secondary | ICD-10-CM | POA: Diagnosis not present

## 2017-08-28 DIAGNOSIS — R0602 Shortness of breath: Secondary | ICD-10-CM | POA: Diagnosis not present

## 2017-08-28 DIAGNOSIS — R1030 Lower abdominal pain, unspecified: Secondary | ICD-10-CM | POA: Insufficient documentation

## 2017-08-28 DIAGNOSIS — D069 Carcinoma in situ of cervix, unspecified: Secondary | ICD-10-CM | POA: Insufficient documentation

## 2017-08-28 DIAGNOSIS — R5383 Other fatigue: Secondary | ICD-10-CM | POA: Insufficient documentation

## 2017-08-28 DIAGNOSIS — N99820 Postprocedural hemorrhage and hematoma of a genitourinary system organ or structure following a genitourinary system procedure: Secondary | ICD-10-CM | POA: Diagnosis not present

## 2017-08-28 DIAGNOSIS — Z87891 Personal history of nicotine dependence: Secondary | ICD-10-CM | POA: Insufficient documentation

## 2017-08-28 DIAGNOSIS — R112 Nausea with vomiting, unspecified: Secondary | ICD-10-CM | POA: Insufficient documentation

## 2017-08-28 DIAGNOSIS — R42 Dizziness and giddiness: Secondary | ICD-10-CM | POA: Insufficient documentation

## 2017-08-28 DIAGNOSIS — N939 Abnormal uterine and vaginal bleeding, unspecified: Secondary | ICD-10-CM | POA: Insufficient documentation

## 2017-08-28 DIAGNOSIS — Z79899 Other long term (current) drug therapy: Secondary | ICD-10-CM | POA: Insufficient documentation

## 2017-08-28 LAB — CBC
HCT: 30 % — ABNORMAL LOW (ref 36.0–46.0)
Hemoglobin: 10 g/dL — ABNORMAL LOW (ref 12.0–15.0)
MCH: 29.2 pg (ref 26.0–34.0)
MCHC: 33.3 g/dL (ref 30.0–36.0)
MCV: 87.7 fL (ref 78.0–100.0)
PLATELETS: 264 10*3/uL (ref 150–400)
RBC: 3.42 MIL/uL — ABNORMAL LOW (ref 3.87–5.11)
RDW: 13.1 % (ref 11.5–15.5)
WBC: 15.7 10*3/uL — ABNORMAL HIGH (ref 4.0–10.5)

## 2017-08-28 LAB — COMPREHENSIVE METABOLIC PANEL
ALK PHOS: 50 U/L (ref 38–126)
ALT: 12 U/L — AB (ref 14–54)
AST: 18 U/L (ref 15–41)
Albumin: 3.6 g/dL (ref 3.5–5.0)
Anion gap: 9 (ref 5–15)
BUN: 7 mg/dL (ref 6–20)
CALCIUM: 8.8 mg/dL — AB (ref 8.9–10.3)
CHLORIDE: 109 mmol/L (ref 101–111)
CO2: 21 mmol/L — AB (ref 22–32)
CREATININE: 0.82 mg/dL (ref 0.44–1.00)
GFR calc Af Amer: >60 mL/min (ref >60)
GFR calc non Af Amer: >60 mL/min (ref >60)
GLUCOSE: 99 mg/dL (ref 65–99)
Potassium: 4.1 mmol/L (ref 3.5–5.1)
SODIUM: 139 mmol/L (ref 135–145)
Total Bilirubin: 0.6 mg/dL (ref 0.3–1.2)
Total Protein: 6.2 g/dL — ABNORMAL LOW (ref 6.5–8.1)

## 2017-08-28 LAB — TYPE AND SCREEN
ABO/RH(D): O POS
Antibody Screen: NEGATIVE

## 2017-08-28 LAB — I-STAT BETA HCG BLOOD, ED (MC, WL, AP ONLY): I-stat hCG, quantitative: <5 m[IU]/mL (ref <5)

## 2017-08-28 LAB — ABO/RH: ABO/RH(D): O POS

## 2017-08-28 MED ORDER — SODIUM CHLORIDE 0.9 % IV BOLUS
1000.0000 mL | Freq: Once | INTRAVENOUS | Status: AC
  Administered 2017-08-28: 1000 mL via INTRAVENOUS

## 2017-08-28 MED ORDER — METRONIDAZOLE 500 MG PO TABS: 500.0000 mg | ORAL_TABLET | Freq: Two times a day (BID) | ORAL | 0 refills | Status: DC

## 2017-08-28 MED ORDER — AMOXICILLIN-POT CLAVULANATE 875-125 MG PO TABS
1.0000 | ORAL_TABLET | Freq: Once | ORAL | Status: AC
  Administered 2017-08-28: 1 via ORAL
  Filled 2017-08-28: qty 1

## 2017-08-28 MED ORDER — METRONIDAZOLE 500 MG PO TABS
500.0000 mg | ORAL_TABLET | Freq: Once | ORAL | Status: AC
  Administered 2017-08-28: 500 mg via ORAL
  Filled 2017-08-28: qty 1

## 2017-08-28 MED ORDER — AMOXICILLIN-POT CLAVULANATE 875-125 MG PO TABS: 1.0000 | ORAL_TABLET | Freq: Two times a day (BID) | ORAL | 0 refills | Status: DC

## 2017-08-28 NOTE — ED Notes (Signed)
ED Provider at bedside. 

## 2017-08-28 NOTE — Discharge Instructions (Signed)
Follow-up with the [place] or the [facility] tomorrow morning for further evaluation.

## 2017-08-28 NOTE — ED Provider Notes (Signed)
[place] EMERGENCY DEPARTMENT Provider Note   CSN: [id number] Arrival date & time: 08/28/17  1543     History   Chief Complaint Chief Complaint  Patient presents with  . Shortness of Breath  . Vaginal Bleeding    HPI Margaret Cameron is a 37 y.o. female who is status post cervical biopsy on August 18, 2017 for abnormal Pap smear, who presents to ED for evaluation of ongoing vaginal bleeding since biopsy.  She states that she was experiencing vaginal bleeding equivalent to her usual menstrual cycle since the biopsy was done.  However, last night she began passing large clots.  Since this morning, she has had to go through 1 pad every 5-10 minutes due to excessive bleeding.  She spoke to the on-call doctor at the [facility] who told her to come to the emergency room for further evaluation.  She has been taking ibuprofen and Tylenol for pain.  She does report lower abdominal cramping, 2 episodes of NBNB emesis and left-sided flank pain.  She is now experiencing some shortness of breath and generalized weakness as well.  Denies any chest pain, fever, hematemesis, use of tampons or recent sexual intercourse, blood thinner use, OCP use.  HPI  Past Medical History:  Diagnosis Date  . Abnormal Pap smear of cervix    LEEP 2011  . Anemia    with pregnancy  . Chronic headaches   . Cyst near coccyx   . Hypoglycemia   . Migraine   . Pilonidal cyst   . Pyelonephritis    x 3 episodes  . Sickle cell trait [facility])     Patient Active Problem List   Diagnosis Date Noted  . SAB (spontaneous abortion) in 2nd trimester 2012--16 weeks, PROM, induced 02/25/2013  . Two previous spontaneous abortions (SAB) affecting care of mother, antepartum 02/25/2013  . GBS (group B streptococcus) UTI complicating pregnancy 52/77/8242  . E-coli UTI 02/25/2013  . Vaginal delivery 02/24/2013  . Hx LEEP (loop electrosurgical excision procedure), cervix, pregnancy 12/09/2012  . Hx of pyelonephritis  12/09/2012  . Anemia 12/09/2012  . Pilonidal cyst without abscess 08/29/2011    Past Surgical History:  Procedure Laterality Date  . DILATION AND CURETTAGE OF UTERUS  2008  . LEEP  2011  . OTHER SURGICAL HISTORY     Pynlodial cyst removed from tail bone 2012  . OTHER SURGICAL HISTORY  10/2010   removal of cyst near coxxyx  . PILONIDAL CYST EXCISION  10/2010     OB History    Gravida  5   Para  2   Term  2   Preterm  0   AB  3   Living  2     SAB  2   TAB  1   Ectopic  0   Multiple  0   Live Births  2            Home Medications    Prior to Admission medications   Medication Sig Start Date End Date Taking? Authorizing Provider  acetaminophen (TYLENOL) 500 MG tablet Take 1,000 mg by mouth every 6 (six) hours as needed.   Yes [provider]  ibuprofen (ADVIL,MOTRIN) 600 MG tablet Take 600 mg by mouth every 6 (six) hours as needed.   Yes [provider]  levonorgestrel (MIRENA, 52 MG,) 20 MCG/24HR IUD Inject 20 mcg into the skin continuous.   Yes [provider]  traMADol (ULTRAM) 50 MG tablet Take 1 tablet (50 mg  total) by mouth every 6 (six) hours as needed for moderate pain. 10/14/16  Yes Domenic Moras, PA-C  amoxicillin-clavulanate (AUGMENTIN) 875-125 MG tablet Take 1 tablet by mouth every 12 (twelve) hours. 08/28/17   Telia Amundson, PA-C  cephALEXin (KEFLEX) 500 MG capsule Take 1 capsule (500 mg total) by mouth 4 (four) times daily. Patient not taking: Reported on 08/28/2017 05/09/17   Ocie Cornfield T, PA-C  cyclobenzaprine (FLEXERIL) 10 MG tablet Take 1 tablet (10 mg total) by mouth 2 (two) times daily as needed for muscle spasms. Patient not taking: Reported on 08/28/2017 09/13/16   Volanda Napoleon, PA-C  diclofenac (VOLTAREN) 75 MG EC tablet Take 1 tablet (75 mg total) by mouth 2 (two) times daily. Take with food Patient not taking: Reported on 08/28/2017 05/27/16   Melynda Ripple, MD  doxycycline (VIBRAMYCIN) 100 MG capsule Take 1  capsule (100 mg total) by mouth 2 (two) times daily. One po bid x 7 days Patient not taking: Reported on 08/28/2017 10/14/16   Domenic Moras, PA-C  metaxalone (SKELAXIN) 800 MG tablet Take 1 tablet (800 mg total) by mouth 3 (three) times daily. Patient not taking: Reported on 08/28/2017 05/27/16   Melynda Ripple, MD  metroNIDAZOLE (FLAGYL) 500 MG tablet Take 1 tablet (500 mg total) by mouth 2 (two) times daily. 08/28/17   Shann Lewellyn, PA-C  predniSONE (STERAPRED UNI-PAK 21 TAB) 10 MG (21) TBPK tablet Dispense one 6 day pack. Take as directed with food. Patient not taking: Reported on 08/28/2017 05/27/16   Melynda Ripple, MD    Family History Family History  Problem Relation Age of Onset  . Hypertension Brother   . Cancer Maternal Aunt        breast    Social History Social History   Tobacco Use  . Smoking status: Former Smoker    Packs/day: 0.10    Types: Cigarettes    Last attempt to quit: 07/14/2014    Years since quitting: 3.1  . Smokeless tobacco: Never Used  Substance Use Topics  . Alcohol use: No  . Drug use: No     Allergies   Sulfamethoxazole-trimethoprim; Codeine; and Septra [bactrim]   Review of Systems Review of Systems  Constitutional: Positive for fatigue. Negative for appetite change, chills and fever.  HENT: Negative for ear pain, rhinorrhea, sneezing and sore throat.   Eyes: Negative for photophobia and visual disturbance.  Respiratory: Negative for cough, chest tightness, shortness of breath and wheezing.   Cardiovascular: Negative for chest pain and palpitations.  Gastrointestinal: Positive for abdominal pain. Negative for blood in stool, constipation, diarrhea, nausea and vomiting.  Genitourinary: Positive for flank pain and vaginal bleeding. Negative for dysuria, hematuria, urgency and vaginal discharge.  Musculoskeletal: Negative for myalgias.  Skin: Negative for rash.  Neurological: Positive for light-headedness. Negative for dizziness and weakness.      Physical Exam Updated Vital Signs BP 113/68   Pulse 85   Temp 97.9 F (36.6 C) (Oral)   Resp (!) 24   SpO2 100%   Physical Exam  Constitutional: She appears well-developed and well-nourished. No distress.  HENT:  Head: Normocephalic and atraumatic.  Nose: Nose normal.  Eyes: Conjunctivae and EOM are normal. Left eye exhibits no discharge. No scleral icterus.  Neck: Normal range of motion. Neck supple.  Cardiovascular: Normal rate, regular rhythm, normal heart sounds and intact distal pulses. Exam reveals no gallop and no friction rub.  No murmur heard. Pulmonary/Chest: Effort normal and breath sounds normal. No respiratory distress.  Abdominal:  Soft. Bowel sounds are normal. She exhibits no distension. There is no tenderness. There is no guarding.  Genitourinary: There is bleeding in the vagina.  Genitourinary Comments: Dark red blood in vaginal vault with several clot-like/tissue like contents noted.  Unable to visualize cervix due to discoloration, tissue.  Musculoskeletal: Normal range of motion. She exhibits no edema.  Neurological: She is alert. She exhibits normal muscle tone. Coordination normal.  Skin: Skin is warm and dry. No rash noted.  Psychiatric: She has a normal mood and affect.  Nursing note and vitals reviewed.    ED Treatments / Results  Labs (all labs ordered are listed, but only abnormal results are displayed) Labs Reviewed  COMPREHENSIVE METABOLIC PANEL - Abnormal; Notable for the following components:      Result Value   CO2 21 (*)    Calcium 8.8 (*)    Total Protein 6.2 (*)    ALT 12 (*)    All other components within normal limits  CBC - Abnormal; Notable for the following components:   WBC 15.7 (*)    RBC 3.42 (*)    Hemoglobin 10.0 (*)    HCT 30.0 (*)    All other components within normal limits  I-STAT BETA HCG BLOOD, ED ([facility], [facility], [facility] ONLY)  TYPE AND SCREEN  ABO/RH    EKG EKG Interpretation  Date/Time:  Monday August 28 2017  16:00:00 EDT Ventricular Rate:  93 PR Interval:  166 QRS Duration: 80 QT Interval:  344 QTC Calculation: 427 R Axis:   64 Text Interpretation:  Normal sinus rhythm Nonspecific T wave abnormality Abnormal ECG No significant change since last tracing Confirmed by Isla Pence [phone]) on 08/28/2017 10:09:23 PM   Radiology Dg Chest 2 View  Result Date: 08/28/2017 CLINICAL DATA:  Shortness of breath. EXAM: CHEST - 2 VIEW COMPARISON:  Radiographs of October 30, 2014. FINDINGS: The heart size and mediastinal contours are within normal limits. Both lungs are clear. No pneumothorax or pleural effusion is noted. The visualized skeletal structures are unremarkable. IMPRESSION: No active cardiopulmonary disease. Electronically Signed   By: Marijo Conception, M.D.   On: 08/28/2017 17:00    Procedures Procedures (including critical care time)  Medications Ordered in ED Medications  sodium chloride 0.9 % bolus 1,000 mL (0 mLs Intravenous Stopped 08/28/17 2254)  metroNIDAZOLE (FLAGYL) tablet 500 mg (500 mg Oral Given 08/28/17 2202)  amoxicillin-clavulanate (AUGMENTIN) 875-125 MG per tablet 1 tablet (1 tablet Oral Given 08/28/17 2202)  sodium chloride 0.9 % bolus 1,000 mL (1,000 mLs Intravenous Canceled Entry 08/28/17 2249)     Initial Impression / Assessment and Plan / ED Course  I have reviewed the triage vital signs and the nursing notes.  Pertinent labs & imaging results that were available during my care of the patient were reviewed by me and considered in my medical decision making (see chart for details).  Clinical Course as of Apr 09 0001  Mon Aug 28, 2017  2119 Spoke to Dr. Vertell Limber, who completed biopsy at [facility].  She recommends consulting OB/GYN here for evaluation and possible packing, catheter placement.   [HK]  2149 Dr. Vertell Limber line for emergencies [phone]   [HK]    Clinical Course User Index [HK] Delia Heady, PA-C    Patient presents to ED for evaluation of ongoing vaginal bleeding since  since cervical cone biopsy was done on March 29.  She states that the initial bleeding was similar to her menstrual cycle.  However, yesterday she began passing  clots and then today soaking through 1 pad every 5-10 minutes.  She spoke to her doctor who recommended her to come to the ED for further evaluation.  She denies use of tampons or sexual intercourse.  She does report some lightheadedness.  On physical exam patient did have clots present in vaginal vault as well as active bleeding.  There was some tissue-like contents noted as well.  Hemoglobin hematocrit stable at 10 and 30.  Her vital signs are stable and within normal limits.  I spoke to Dr. Vertell Limber, her doctor from the [place] who completed the biopsy.  They recommended consulting our OB/GYN for possible packing and catheter placement.  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  Dr. Vertell Limber stated that the patient waited about a year before coming in for her biopsy.  I then spoke to the on call provider at the [facility] who recommended that we pack the patient's vaginal vault with Kerlix and place her on Flagyl and Augmentin for possible infection.  They did recommend having the patient follow-up with either the [place] or the [facility] tomorrow morning for further evaluation.  Patient given first dose of antibiotics here in the ED and packing was placed.  She was given 2 L of fluid with improvement in her symptoms. Patient is agreeable to this plan.  Advised to return for any severe or worsening symptoms.  Portions of this note were generated with [employer]. Dictation errors may occur despite best attempts at proofreading.   Final Clinical Impressions(s) / ED Diagnoses   Final diagnoses:  Vaginal bleeding    ED Discharge Orders        Ordered    metroNIDAZOLE (FLAGYL) 500 MG tablet  2 times daily     08/28/17 2355    amoxicillin-clavulanate (AUGMENTIN) 875-125 MG tablet   Every 12 hours     08/28/17 2355       Delia Heady, PA-C 08/29/17 0013    Isla Pence, MD 08/29/17 1558

## 2017-08-28 NOTE — ED Triage Notes (Addendum)
Pt reports having cervical biopsy done on 3/29/ having heavy bleeding since and reports passing large clots, states she is bleeding through one pad every 5 minutes. Having sob and weakness. Appears pale at triage.

## 2017-08-29 ENCOUNTER — Encounter (HOSPITAL_COMMUNITY): Payer: Self-pay

## 2017-08-29 ENCOUNTER — Inpatient Hospital Stay (EMERGENCY_DEPARTMENT_HOSPITAL)
Admission: AD | Admit: 2017-08-29 | Discharge: 2017-08-29 | Disposition: A | Discharge status: Home Health | Payer: Non-veteran care | Source: Ambulatory Visit | Attending: Obstetrics and Gynecology | Admitting: Obstetrics and Gynecology

## 2017-08-29 DIAGNOSIS — Z87891 Personal history of nicotine dependence: Secondary | ICD-10-CM | POA: Insufficient documentation

## 2017-08-29 DIAGNOSIS — Z9889 Other specified postprocedural states: Secondary | ICD-10-CM

## 2017-08-29 DIAGNOSIS — N99820 Postprocedural hemorrhage and hematoma of a genitourinary system organ or structure following a genitourinary system procedure: Secondary | ICD-10-CM

## 2017-08-29 DIAGNOSIS — T8149XA Infection following a procedure, other surgical site, initial encounter: Secondary | ICD-10-CM

## 2017-08-29 LAB — CBC WITH DIFFERENTIAL/PLATELET
BASOS ABS: 0 10*3/uL (ref 0.0–0.1)
BASOS PCT: 0 %
EOS ABS: 0.1 10*3/uL (ref 0.0–0.7)
Eosinophils Relative: 1 %
HCT: 24.3 % — ABNORMAL LOW (ref 36.0–46.0)
HEMOGLOBIN: 8.5 g/dL — AB (ref 12.0–15.0)
Lymphocytes Relative: 19 %
Lymphs Abs: 2.3 10*3/uL (ref 0.7–4.0)
MCH: 30.5 pg (ref 26.0–34.0)
MCHC: 35 g/dL (ref 30.0–36.0)
MCV: 87.1 fL (ref 78.0–100.0)
Monocytes Absolute: 0.4 10*3/uL (ref 0.1–1.0)
Monocytes Relative: 4 %
NEUTROS PCT: 76 %
Neutro Abs: 9.3 10*3/uL — ABNORMAL HIGH (ref 1.7–7.7)
PLATELETS: 215 10*3/uL (ref 150–400)
RBC: 2.79 MIL/uL — AB (ref 3.87–5.11)
RDW: 13.3 % (ref 11.5–15.5)
WBC: 12.1 10*3/uL — AB (ref 4.0–10.5)

## 2017-08-29 LAB — TYPE AND SCREEN
ABO/RH(D): O POS
Antibody Screen: NEGATIVE

## 2017-08-29 MED ORDER — FERRIC SUBSULFATE 259 MG/GM EX SOLN
CUTANEOUS | Status: AC
  Filled 2017-08-29: qty 8

## 2017-08-29 MED ORDER — ONDANSETRON 8 MG PO TBDP
8.0000 mg | ORAL_TABLET | Freq: Once | ORAL | Status: AC
  Administered 2017-08-29: 8 mg via ORAL
  Filled 2017-08-29: qty 1

## 2017-08-29 NOTE — Discharge Instructions (Signed)
Cervical Conization, Care After °This sheet gives you information about how to care for yourself after your procedure. Your doctor may also give you more specific instructions. If you have problems or questions, contact your doctor. °Follow these instructions at home: °Medicines °· Take over-the-counter and prescription medicines only as told by your doctor. °· Do not take aspirin until your doctor says it is okay. °· If you take pain medicine: °? You may have constipation. To help treat this, your doctor may tell you to: °§ Drink enough fluid to keep your pee (urine) clear or pale yellow. °§ Take medicines. °§ Eat foods that are high in fiber. These include fresh fruits and vegetables, whole grains, bran, and beans. °§ Limit foods that are high in fat and sugar. These include fried foods and sweet foods. °? Do not drive or use heavy machines. °General instructions °· You can eat your usual diet unless your doctor tells you not to do so. °· Take showers for the first week. Do not take baths, swim, or use hot tubs until your doctor says it is okay. °· Do not douche, use tampons, or have sex until your doctor says it is okay. °· For 7-14 days after your procedure, avoid: °? Being very active. °? Exercising. °? Heavy lifting. °· Keep all follow-up visits as told by your doctor. This is important. °Contact a doctor if: °· You have a rash. °· You are dizzy or lightheaded. °· You feel sick to your stomach (nauseous). °· You throw up (vomit). °· You have fluid from your vagina (vaginal discharge) that smells bad. °Get help right away if: °· There are blood clots coming from your vagina. °· You have more bleeding than you would have in a normal period. For example, you soak a pad in less than 1 hour. °· You have a fever. °· You have more and more cramps. °· You pass out (faint). °· You have pain when peeing. °· Your have a lot of pain. °· Your pain gets worse. °· Your pain does not get better when you take your  medicine. °· You have blood in your pee. °· You throw up (vomit). °Summary °· After your procedure, take over-the-counter and prescription medicines only as told by your doctor. °· Do not douche, use tampons, or have sex until your doctor says it is okay. °· For about 7-14 days after your procedure, try not to exercise or lift heavy objects. °· Get help right away if you have new symptoms, or if your symptoms become worse. °This information is not intended to replace advice given to you by your health care provider. Make sure you discuss any questions you have with your health care provider. °Document Released: 02/16/2008 Document Revised: 05/11/2016 Document Reviewed: 05/11/2016 °Elsevier Interactive Patient Education © 2017 Elsevier Inc. ° °

## 2017-08-29 NOTE — ED Notes (Signed)
Pt verbalizes understanding of d/c instructions. Pt received prescriptions. Pt taken out to lobby in wheelchair at d/c with all belongings.   

## 2017-08-29 NOTE — MAU Note (Signed)
Had cone biopsy done at [facility] on 3/29. Wk later started having heavy bleeding, soaking pads.  Went to [facility] last night,  They packed it.  Packing fell out about 0800.  Bleeding is not as heavy nor is she passing clots like she was.  Pain is concentrated on LLQ, started yesterday, cramping also.  Vomiting has continued, not as bad, last was around 0700.  Little dizzy. Denies fever or diarrhea

## 2017-08-29 NOTE — MAU Provider Note (Addendum)
Chief Complaint: Vaginal Bleeding; Abdominal Pain; Nausea; and Dizziness   First Provider Initiated Contact with Patient 08/29/17 1049     SUBJECTIVE HPI: Margaret Cameron is a 37 y.o. 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 Was seen at [facility] ED yesterday soaking a pad every 5-10 minutes. See note for details. 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 attending last night, the ED provider packed her vagina and told her to F/U at [place] fell out this morning. Bleeding has been much better, but she reports dizziness and SOB same as at ED yesterday. EKG and CXR were Nml. SOB is w/ exertion only. No Syncope or near-syncope. Also reports pelvic/low abd pain L>R. Presumed infection from Cone Bx. Started on Augmentin and Flagyl. Took first dose last night. Denies fever, chills or purulent vaginal drainage. Has not resumed IC sine procedure.    Past Medical History:  Diagnosis Date  . Abnormal Pap smear of cervix    LEEP 2011  . Anemia    with pregnancy  . Chronic headaches   . Cyst near coccyx   . Hypoglycemia   . Migraine   . Pilonidal cyst   . Pyelonephritis    x 3 episodes  . Sickle cell trait (HCC)    OB History  Gravida Para Term Preterm AB Living  5 2 2  0 3 2  SAB TAB Ectopic Multiple Live Births  2 1 0 0 2    # Outcome Date GA Lbr Len/2nd Weight Sex Delivery Anes PTL Lv  5 Term 02/24/13 [redacted]w[redacted]d 09:48 / 00:01 6 lb 15.5 oz (3.16 kg) M Vag-Spont EPI  LIV  4 SAB 04/13/11 [redacted]w[redacted]d / 00:02  M  None  FD     Birth Comments: None  3 Term 04/10/08 [redacted]w[redacted]d   M    LIV  2 SAB           1 TAB            Past Surgical History:  Procedure Laterality Date  . CERVICAL CONE BIOPSY    . DILATION AND CURETTAGE OF UTERUS  2008  . LEEP  2011  . OTHER SURGICAL HISTORY      Pynlodial cyst removed from tail bone 2012  . OTHER SURGICAL HISTORY  10/2010   removal of cyst near coxxyx  . PILONIDAL CYST EXCISION  10/2010   Social History   Socioeconomic History  . Marital status: Married    Spouse name: Not on file  . Number of children: Not on file  . Years of education: Not on file  . Highest education level: Not on file  Occupational History  . Not on file  Social Needs  . Financial resource strain: Not on file  . Food insecurity:    Worry: Not on file    Inability: Not on file  . Transportation needs:    Medical: Not on file    Non-medical: Not on file  Tobacco Use  . Smoking status: Former Smoker    Packs/day: 0.10    Types: Cigarettes    Last attempt to quit: 07/14/2014    Years since quitting: 3.1  . Smokeless tobacco: Never Used  Substance and Sexual Activity  . Alcohol use: No  . Drug use: No  .  Sexual activity: Not Currently    Birth control/protection: Inserts  Lifestyle  . Physical activity:    Days per week: Not on file    Minutes per session: Not on file  . Stress: Not on file  Relationships  . Social connections:    Talks on phone: Not on file    Gets together: Not on file    Attends religious service: Not on file    Active member of club or organization: Not on file    Attends meetings of clubs or organizations: Not on file    Relationship status: Not on file  . Intimate partner violence:    Fear of current or ex partner: Not on file    Emotionally abused: Not on file    Physically abused: Not on file    Forced sexual activity: Not on file  Other Topics Concern  . Not on file  Social History Narrative  . Not on file   Family History  Problem Relation Age of Onset  . Hypertension Brother   . Cancer Maternal Aunt        breast   No current facility-administered medications on file prior to encounter.    Current Outpatient Medications on File Prior to Encounter  Medication Sig Dispense Refill  .  amoxicillin-clavulanate (AUGMENTIN) 875-125 MG tablet Take 1 tablet by mouth every 12 (twelve) hours. 14 tablet 0  . ibuprofen (ADVIL,MOTRIN) 600 MG tablet Take 600 mg by mouth every 6 (six) hours as needed for headache, mild pain or cramping.     . metroNIDAZOLE (FLAGYL) 500 MG tablet Take 1 tablet (500 mg total) by mouth 2 (two) times daily. 14 tablet 0  . OVER THE COUNTER MEDICATION Take 1 Dose by mouth daily. Allergy medication-    . cephALEXin (KEFLEX) 500 MG capsule Take 1 capsule (500 mg total) by mouth 4 (four) times daily. (Patient not taking: Reported on 08/28/2017) 20 capsule 0  . cyclobenzaprine (FLEXERIL) 10 MG tablet Take 1 tablet (10 mg total) by mouth 2 (two) times daily as needed for muscle spasms. (Patient not taking: Reported on 08/28/2017) 20 tablet 0  . doxycycline (VIBRAMYCIN) 100 MG capsule Take 1 capsule (100 mg total) by mouth 2 (two) times daily. One po bid x 7 days (Patient not taking: Reported on 08/28/2017) 14 capsule 0  . metaxalone (SKELAXIN) 800 MG tablet Take 1 tablet (800 mg total) by mouth 3 (three) times daily. (Patient not taking: Reported on 08/28/2017) 21 tablet 0  . predniSONE (STERAPRED UNI-PAK 21 TAB) 10 MG (21) TBPK tablet Dispense one 6 day pack. Take as directed with food. (Patient not taking: Reported on 08/28/2017) 21 tablet 0  . traMADol (ULTRAM) 50 MG tablet Take 1 tablet (50 mg total) by mouth every 6 (six) hours as needed for moderate pain. 12 tablet 0   Allergies  Allergen Reactions  . Sulfamethoxazole-Trimethoprim Itching  . Codeine Rash  . Septra [Bactrim] Rash    I have reviewed patient's Past Medical Hx, Surgical Hx, Family Hx, Social Hx, medications and allergies.   Review of Systems  Constitutional: Positive for fatigue. Negative for chills and fever.  Respiratory: Positive for shortness of breath. Negative for cough, chest tightness, wheezing and stridor.   Cardiovascular: Negative for chest pain, palpitations and leg swelling.   Gastrointestinal: Positive for abdominal pain. Negative for abdominal distention.  Genitourinary: Positive for pelvic pain and vaginal bleeding. Negative for difficulty urinating, dysuria, flank pain, vaginal discharge and vaginal pain.  Neurological: Positive for  dizziness and light-headedness. Negative for syncope.  Hematological: Does not bruise/bleed easily.    OBJECTIVE Patient Vitals for the past 24 hrs:  BP Temp Temp src Pulse Resp SpO2 Weight  08/29/17 1210 - - - - - 100 % -  08/29/17 1205 - - - - - 99 % -  08/29/17 1200 - - - - - 100 % -  08/29/17 1155 - - - - - 100 % -  08/29/17 1150 - - - - - 100 % -  08/29/17 1145 - - - - - 100 % -  08/29/17 1140 - - - - - 100 % -  08/29/17 1135 - - - - - 100 % -  08/29/17 1130 - - - - - 98 % -  08/29/17 1125 - - - - - 100 % -  08/29/17 1120 - - - - - 99 % -  08/29/17 1115 - - - - - 100 % -  08/29/17 1110 - - - - - 100 % -  08/29/17 1105 - - - - - 93 % -  08/29/17 1100 - - - - - 100 % -  08/29/17 1055 - - - - - 100 % -  08/29/17 1052 - - - - - 100 % -  08/29/17 1040 - - - - - 98 % -  08/29/17 1035 - - - - - 100 % -  08/29/17 1030 - - - - - 100 % -  08/29/17 1020 (!) 108/56 98.2 F (36.8 C) Oral 89 20 100 % 177 lb 8 oz (80.5 kg)  08/29/17 1018 - - - - - 100 % -   Constitutional: Well-developed, well-nourished female in no acute distress.   Skin: Pale Cardiovascular: normal rate, no diaphoresis Respiratory: normal rate and Some Dyspnea w/ ambulation and prolonged.  GI: Abd soft, non-tender. MS: Extremities nontender, no edema, normal ROM Neurologic: Alert and oriented x 4.  GU: Neg CVAT.  SPECULUM EXAM: NEFG, small amount of active bleeding. Moderate-sized clot in vagina not adherent to site of Bx, removed carefully w/out new bleeding. An IUD was inside the clot. Mild tenderness w/ manipulation of remaining cervix. No purulent drainage. Mild odor. Bx site coated w/ Monsel's solution. No active bleeding. Vagina packed w/ 2 inch  gauze. Pt tolerated well.  BIMANUAL: Deferred  LAB RESULTS Results for orders placed or performed during the hospital encounter of 08/29/17 (from the past 24 hour(s))  Type and screen [place]     Status: None   Collection Time: 08/29/17 10:46 AM  Result Value Ref Range   ABO/RH(D) O POS    Antibody Screen NEG    Sample Expiration      09/01/2017 Performed at [facility], [address] [place], [place] [address]   CBC with Differential/Platelet     Status: Abnormal   Collection Time: 08/29/17 10:46 AM  Result Value Ref Range   WBC 12.1 (H) 4.0 - 10.5 K/uL   RBC 2.79 (L) 3.87 - 5.11 MIL/uL   Hemoglobin 8.5 (L) 12.0 - 15.0 g/dL   HCT 24.3 (L) 36.0 - 46.0 %   MCV 87.1 78.0 - 100.0 fL   MCH 30.5 26.0 - 34.0 pg   MCHC 35.0 30.0 - 36.0 g/dL   RDW 13.3 11.5 - 15.5 %   Platelets 215 150 - 400 K/uL   Neutrophils Relative % 76 %   Neutro Abs 9.3 (H) 1.7 - 7.7 K/uL   Lymphocytes Relative 19 %  Lymphs Abs 2.3 0.7 - 4.0 K/uL   Monocytes Relative 4 %   Monocytes Absolute 0.4 0.1 - 1.0 K/uL   Eosinophils Relative 1 %   Eosinophils Absolute 0.1 0.0 - 0.7 K/uL   Basophils Relative 0 %   Basophils Absolute 0.0 0.0 - 0.1 K/uL    IMAGING Dg Chest 2 View  Result Date: 08/28/2017 CLINICAL DATA:  Shortness of breath. EXAM: CHEST - 2 VIEW COMPARISON:  Radiographs of October 30, 2014. FINDINGS: The heart size and mediastinal contours are within normal limits. Both lungs are clear. No pneumothorax or pleural effusion is noted. The visualized skeletal structures are unremarkable. IMPRESSION: No active cardiopulmonary disease. Electronically Signed   By: Marijo Conception, M.D.   On: 08/28/2017 17:00    MAU COURSE Orders Placed This Encounter  Procedures  . CBC with Differential/Platelet  . Orthostatic vital signs  . Type and screen [place]  . Discharge patient   Meds ordered this encounter  Medications  . ondansetron (ZOFRAN-ODT) disintegrating  tablet 8 mg  . ferric subsulfate (MONSEL'S) 259 MG/GM paste    Spurlock-Frizzell, J: cabinet override   Discussed Hx, labs, exam w/ Dr. Rosana Hoes. Agrees w/ POC. New orders: F/U in clinic in 2 days. Pt agrees w/ POC. Will not be able to get appt w/ her Gyn in [place] for weeks.    Explained to pt that IUD came out w/ clots. Pt explained that she wasn't sure if it had still been in there because a previous provider couldn't see strings. Was planning BTL after IUD expired in two years. Instructed pt to abstain until contraception could be started (and until she has completely healed from the Cone Bx.) Strongly recommend against pregnancy.  MDM - Vaginal bleeding after Cone Bx, now hemostatic. - Anemia of blood loss. Transfusion not indicated as pt is not orthostatic or near-syncopal. Start PO iron and push fluids.  - SOB likely 2/2 significant anemia. Work-up at ED neg for emergent condition. Expect improvement w/ cessation of bleeding and iron supplementation, but pt instructed to return to ED if is worsens.  - SUspected mild infection at Bx site. Afebrile and tolerating POs. Appropriate for OP ABX.   ASSESSMENT 1. Postprocedural hemorrhage of a genitourinary system organ or structure following a genitourinary system procedure   2. S/P cone biopsy of cervix   3. Postprocedural wound infection     PLAN Discharge home in stable condition. Bleeding and infection precautions Follow-up [place] for [place] Follow up on 08/31/2017.   Specialty:  Obstetrics and Gynecology Why:  at 10:35 Contact information: [facility] [phone]       [place] Follow up.   Why:  as needed in gynecology emergencies Contact information: [address] [phone] [place] [phone]         Allergies as of 08/29/2017      Reactions    Sulfamethoxazole-trimethoprim Itching   Codeine Rash   Septra [bactrim] Rash      Medication List    STOP taking these medications   cephALEXin 500 MG capsule Commonly known as:  KEFLEX   cyclobenzaprine 10 MG tablet Commonly known as:  FLEXERIL   doxycycline 100 MG capsule Commonly known as:  VIBRAMYCIN   metaxalone 800 MG tablet Commonly known as:  SKELAXIN   predniSONE 10 MG (21) Tbpk tablet Commonly known as:  STERAPRED UNI-PAK 21  TAB   traMADol 50 MG tablet Commonly known as:  ULTRAM     TAKE these medications   amoxicillin-clavulanate 875-125 MG tablet Commonly known as:  AUGMENTIN Take 1 tablet by mouth every 12 (twelve) hours.   ibuprofen 600 MG tablet Commonly known as:  ADVIL,MOTRIN Take 600 mg by mouth every 6 (six) hours as needed for headache, mild pain or cramping.   metroNIDAZOLE 500 MG tablet Commonly known as:  FLAGYL Take 1 tablet (500 mg total) by mouth 2 (two) times daily.   OVER THE COUNTER MEDICATION Take 1 Dose by mouth daily. Allergy medication-        Tamala Julian, [place], [place] 08/29/2017  1:59 PM

## 2017-08-31 ENCOUNTER — Ambulatory Visit (INDEPENDENT_AMBULATORY_CARE_PROVIDER_SITE_OTHER): Payer: Non-veteran care | Admitting: Obstetrics & Gynecology

## 2017-08-31 ENCOUNTER — Encounter: Payer: Self-pay | Admitting: Obstetrics & Gynecology

## 2017-08-31 ENCOUNTER — Encounter: Payer: Self-pay | Admitting: Obstetrics and Gynecology

## 2017-08-31 DIAGNOSIS — D069 Carcinoma in situ of cervix, unspecified: Secondary | ICD-10-CM | POA: Diagnosis not present

## 2017-08-31 NOTE — Progress Notes (Signed)
Subjective:cc: f/u after vaginal bleeding 2 week postop CKC at [place] is a 37 y.o. female who presents to the clinic 2 weeks status post CKC for severe dysplasia. Eating a regular diet without difficulty. Bowel movements are normal. The patient is not having any pain. She was seen at [facility] ED and MAU. Vaginal pack needs to be removed, placed 4/9  The following portions of the patient's history were reviewed and updated as appropriate: allergies, current medications, past family history, past medical history, past social history, past surgical history and problem list.  Review of Systems Genitourinary:positive for vaginal discharge and dark blood and odor    Objective:    BP 120/61   Pulse 75   Ht 5\' 6"  (1.676 m)   Wt 174 lb 6.4 oz (79.1 kg)   LMP 07/25/2017   BMI 28.15 kg/m  General:  alert, cooperative and no distress  Abdomen: soft  vagina     pack removed, dark blood stained and no active cervical bleeding  Assessment:    Postoperative course complicated by vaginal bleeding Operative findings again reviewed. Pathology report discussed.    Plan:    1. Continue any current medications. 2. Wound care discussed. 3. Activity restrictions: pelvic rest 4. Anticipated return to work: 1-2 weeks. 5. Follow up: 4/26 at [facility]  Woodroe Mode, MD 08/31/2017

## 2017-08-31 NOTE — Patient Instructions (Signed)
Cervical Conization, Care After °This sheet gives you information about how to care for yourself after your procedure. Your doctor may also give you more specific instructions. If you have problems or questions, contact your doctor. °Follow these instructions at home: °Medicines °· Take over-the-counter and prescription medicines only as told by your doctor. °· Do not take aspirin until your doctor says it is okay. °· If you take pain medicine: °? You may have constipation. To help treat this, your doctor may tell you to: °§ Drink enough fluid to keep your pee (urine) clear or pale yellow. °§ Take medicines. °§ Eat foods that are high in fiber. These include fresh fruits and vegetables, whole grains, bran, and beans. °§ Limit foods that are high in fat and sugar. These include fried foods and sweet foods. °? Do not drive or use heavy machines. °General instructions °· You can eat your usual diet unless your doctor tells you not to do so. °· Take showers for the first week. Do not take baths, swim, or use hot tubs until your doctor says it is okay. °· Do not douche, use tampons, or have sex until your doctor says it is okay. °· For 7-14 days after your procedure, avoid: °? Being very active. °? Exercising. °? Heavy lifting. °· Keep all follow-up visits as told by your doctor. This is important. °Contact a doctor if: °· You have a rash. °· You are dizzy or lightheaded. °· You feel sick to your stomach (nauseous). °· You throw up (vomit). °· You have fluid from your vagina (vaginal discharge) that smells bad. °Get help right away if: °· There are blood clots coming from your vagina. °· You have more bleeding than you would have in a normal period. For example, you soak a pad in less than 1 hour. °· You have a fever. °· You have more and more cramps. °· You pass out (faint). °· You have pain when peeing. °· Your have a lot of pain. °· Your pain gets worse. °· Your pain does not get better when you take your  medicine. °· You have blood in your pee. °· You throw up (vomit). °Summary °· After your procedure, take over-the-counter and prescription medicines only as told by your doctor. °· Do not douche, use tampons, or have sex until your doctor says it is okay. °· For about 7-14 days after your procedure, try not to exercise or lift heavy objects. °· Get help right away if you have new symptoms, or if your symptoms become worse. °This information is not intended to replace advice given to you by your health care provider. Make sure you discuss any questions you have with your health care provider. °Document Released: 02/16/2008 Document Revised: 05/11/2016 Document Reviewed: 05/11/2016 °Elsevier Interactive Patient Education © 2017 Elsevier Inc. ° °

## 2018-01-01 ENCOUNTER — Other Ambulatory Visit: Payer: Self-pay

## 2018-01-01 ENCOUNTER — Emergency Department (HOSPITAL_COMMUNITY)
Admission: EM | Admit: 2018-01-01 | Discharge: 2018-01-01 | Disposition: A | Discharge status: Home / Self Care | Payer: Non-veteran care | Attending: Emergency Medicine | Admitting: Emergency Medicine

## 2018-01-01 DIAGNOSIS — Z87891 Personal history of nicotine dependence: Secondary | ICD-10-CM | POA: Diagnosis not present

## 2018-01-01 DIAGNOSIS — Z79899 Other long term (current) drug therapy: Secondary | ICD-10-CM | POA: Insufficient documentation

## 2018-01-01 DIAGNOSIS — M79671 Pain in right foot: Secondary | ICD-10-CM | POA: Diagnosis present

## 2018-01-01 DIAGNOSIS — B070 Plantar wart: Secondary | ICD-10-CM | POA: Insufficient documentation

## 2018-01-01 MED ORDER — SALICYLIC ACID 28 % EX SOLN: 1.0000 "application " | Freq: Two times a day (BID) | CUTANEOUS | 1 refills | Status: AC

## 2018-01-01 NOTE — ED Provider Notes (Addendum)
[facility] DEPT Provider Note: Margaret Spurling, MD, FACEP  CSN: [id number] MRN: [id number] ARRIVAL: 01/01/18 at [facility]: [id number]   CHIEF COMPLAINT  Foot Pain   HISTORY OF PRESENT ILLNESS  01/01/18 1:51 AM Margaret Cameron is a 37 y.o. female with a 27-month history of pain in her right heel.  The pain is well localized.  Over the past several days to spot has darkened in color.  She rates her pain as a 10 out of 10 when weightbearing.   Past Medical History:  Diagnosis Date  . Abnormal Pap smear of cervix    LEEP 2011  . Anemia    with pregnancy  . Chronic headaches   . Cyst near coccyx   . Hypoglycemia   . Migraine   . Pilonidal cyst   . Pyelonephritis    x 3 episodes  . Sickle cell trait [facility])     Past Surgical History:  Procedure Laterality Date  . CERVICAL CONE BIOPSY    . DILATION AND CURETTAGE OF UTERUS  2008  . LEEP  2011  . OTHER SURGICAL HISTORY     Pynlodial cyst removed from tail bone 2012  . OTHER SURGICAL HISTORY  10/2010   removal of cyst near coxxyx  . PILONIDAL CYST EXCISION  10/2010    Family History  Problem Relation Age of Onset  . Hypertension Brother   . Cancer Maternal Aunt        breast    Social History   Tobacco Use  . Smoking status: Former Smoker    Packs/day: 0.10    Types: Cigarettes    Last attempt to quit: 07/14/2014    Years since quitting: 3.4  . Smokeless tobacco: Never Used  Substance Use Topics  . Alcohol use: No  . Drug use: No    Prior to Admission medications   Medication Sig Start Date End Date Taking? Authorizing Provider  amoxicillin-clavulanate (AUGMENTIN) 875-125 MG tablet Take 1 tablet by mouth every 12 (twelve) hours. 08/28/17   Khatri, Hina, PA-C  ibuprofen (ADVIL,MOTRIN) 600 MG tablet Take 600 mg by mouth every 6 (six) hours as needed for headache, mild pain or cramping.     [provider]  metroNIDAZOLE (FLAGYL) 500 MG tablet Take 1 tablet (500 mg total) by mouth 2 (two) times daily.  08/28/17   Khatri, Hina, PA-C  OVER THE COUNTER MEDICATION Take 1 Dose by mouth daily. Allergy medication-    [provider]    Allergies Sulfamethoxazole-trimethoprim; Codeine; and Septra [bactrim]   REVIEW OF SYSTEMS  Negative except as noted here or in the History of Present Illness.   PHYSICAL EXAMINATION  Initial Vital Signs Blood pressure 102/83, pulse 85, temperature 98.5 F (36.9 C), temperature source Oral, resp. rate 16, height 5\' 6"  (1.676 m), weight 78 kg, SpO2 94 %.  Examination General: Well-developed, well-nourished female in no acute distress; appearance consistent with age of record HENT: normocephalic; atraumatic Eyes: Normal appearance Neck: supple Heart: regular rate and rhythm Lungs: clear to auscultation bilaterally Abdomen: soft; nondistended; nontender; bowel sounds present Extremities: No deformity; full range of motion Neurologic: Awake, alert and oriented; motor function intact in all extremities and symmetric; no facial droop Skin: Warm and dry; tender plantar wart of right heel Psychiatric: Normal mood and affect   RESULTS  Summary of this visit's results, reviewed by myself:   EKG Interpretation  Date/Time:    Ventricular Rate:    PR Interval:    QRS Duration:  QT Interval:    QTC Calculation:   R Axis:     Text Interpretation:        Laboratory Studies: No results found for this or any previous visit (from the past 24 hour(s)). Imaging Studies: No results found.  ED COURSE and MDM  Nursing notes and initial vitals signs, including pulse oximetry, reviewed.  Vitals:   01/01/18 0140  BP: 102/83  Pulse: 85  Resp: 16  Temp: 98.5 F (36.9 C)  TempSrc: Oral  SpO2: 94%  Weight: 78 kg  Height: 5\' 6"  (1.676 m)   Treat with topical salicylic acid.  Will refer to podiatry if symptoms are not improving.  She was also advised that corn pads may help redistribute the weight and reduce pain.  PROCEDURES    ED DIAGNOSES       ICD-10-CM   1. Plantar wart of right foot B07.0        Melady Chow, MD 01/01/18 0158    Shanon Rosser, MD 01/01/18 9326

## 2018-01-01 NOTE — ED Triage Notes (Signed)
Pt to ED by POV with c/o of right foot pain. Pt states she has a spot on the bottom of her foot that has just started to turn a black color. Pt states pain is 10/10 when walking on the foot.

## 2018-09-27 ENCOUNTER — Encounter (HOSPITAL_COMMUNITY)
Admission: EM | Disposition: A | Discharge status: Home / Self Care | Payer: Self-pay | Source: Home / Self Care | Attending: Emergency Medicine

## 2018-09-27 ENCOUNTER — Emergency Department (HOSPITAL_COMMUNITY): Payer: No Typology Code available for payment source

## 2018-09-27 ENCOUNTER — Emergency Department (HOSPITAL_COMMUNITY): Payer: No Typology Code available for payment source | Admitting: Certified Registered Nurse Anesthetist

## 2018-09-27 ENCOUNTER — Other Ambulatory Visit: Payer: Self-pay

## 2018-09-27 ENCOUNTER — Emergency Department (HOSPITAL_COMMUNITY)
Admission: EM | Admit: 2018-09-27 | Discharge: 2018-09-27 | Disposition: A | Discharge status: Home / Self Care | Payer: No Typology Code available for payment source | Attending: Emergency Medicine | Admitting: Emergency Medicine

## 2018-09-27 ENCOUNTER — Encounter (HOSPITAL_COMMUNITY): Payer: Self-pay

## 2018-09-27 DIAGNOSIS — Z1159 Encounter for screening for other viral diseases: Secondary | ICD-10-CM | POA: Insufficient documentation

## 2018-09-27 DIAGNOSIS — Z87891 Personal history of nicotine dependence: Secondary | ICD-10-CM | POA: Insufficient documentation

## 2018-09-27 DIAGNOSIS — D573 Sickle-cell trait: Secondary | ICD-10-CM | POA: Diagnosis not present

## 2018-09-27 DIAGNOSIS — R51 Headache: Secondary | ICD-10-CM | POA: Insufficient documentation

## 2018-09-27 DIAGNOSIS — N882 Stricture and stenosis of cervix uteri: Secondary | ICD-10-CM | POA: Diagnosis not present

## 2018-09-27 DIAGNOSIS — D069 Carcinoma in situ of cervix, unspecified: Secondary | ICD-10-CM | POA: Diagnosis not present

## 2018-09-27 DIAGNOSIS — N857 Hematometra: Secondary | ICD-10-CM

## 2018-09-27 DIAGNOSIS — R102 Pelvic and perineal pain: Secondary | ICD-10-CM

## 2018-09-27 HISTORY — PX: DILATION AND CURETTAGE OF UTERUS: SHX78

## 2018-09-27 LAB — COMPREHENSIVE METABOLIC PANEL
ALT: 14 U/L (ref 0–44)
AST: 29 U/L (ref 15–41)
Albumin: 4.9 g/dL (ref 3.5–5.0)
Alkaline Phosphatase: 45 U/L (ref 38–126)
Anion gap: 9 (ref 5–15)
BUN: 8 mg/dL (ref 6–20)
CO2: 19 mmol/L — ABNORMAL LOW (ref 22–32)
Calcium: 9.2 mg/dL (ref 8.9–10.3)
Chloride: 109 mmol/L (ref 98–111)
Creatinine, Ser: 0.85 mg/dL (ref 0.44–1.00)
GFR calc Af Amer: >60 mL/min (ref >60)
GFR calc non Af Amer: >60 mL/min (ref >60)
Glucose, Bld: 109 mg/dL — ABNORMAL HIGH (ref 70–99)
Potassium: 4.4 mmol/L (ref 3.5–5.1)
Sodium: 137 mmol/L (ref 135–145)
Total Bilirubin: 1.3 mg/dL — ABNORMAL HIGH (ref 0.3–1.2)
Total Protein: 7.7 g/dL (ref 6.5–8.1)

## 2018-09-27 LAB — URINALYSIS, ROUTINE W REFLEX MICROSCOPIC
Bacteria, UA: NONE SEEN
Bilirubin Urine: NEGATIVE
Glucose, UA: NEGATIVE mg/dL
Ketones, ur: NEGATIVE mg/dL
Leukocytes,Ua: NEGATIVE
Nitrite: NEGATIVE
Protein, ur: NEGATIVE mg/dL
Specific Gravity, Urine: 1.014 (ref 1.005–1.030)
pH: 5 (ref 5.0–8.0)

## 2018-09-27 LAB — CBC WITH DIFFERENTIAL/PLATELET
Abs Immature Granulocytes: 0.02 10*3/uL (ref 0.00–0.07)
Basophils Absolute: 0 10*3/uL (ref 0.0–0.1)
Basophils Relative: 0 %
Eosinophils Absolute: 0.1 10*3/uL (ref 0.0–0.5)
Eosinophils Relative: 1 %
HCT: 41.3 % (ref 36.0–46.0)
Hemoglobin: 13.5 g/dL (ref 12.0–15.0)
Immature Granulocytes: 0 %
Lymphocytes Relative: 34 %
Lymphs Abs: 4.1 10*3/uL — ABNORMAL HIGH (ref 0.7–4.0)
MCH: 30.2 pg (ref 26.0–34.0)
MCHC: 32.7 g/dL (ref 30.0–36.0)
MCV: 92.4 fL (ref 80.0–100.0)
Monocytes Absolute: 0.5 10*3/uL (ref 0.1–1.0)
Monocytes Relative: 4 %
Neutro Abs: 7.1 10*3/uL (ref 1.7–7.7)
Neutrophils Relative %: 61 %
Platelets: 238 10*3/uL (ref 150–400)
RBC: 4.47 MIL/uL (ref 3.87–5.11)
RDW: 13.3 % (ref 11.5–15.5)
WBC: 11.8 10*3/uL — ABNORMAL HIGH (ref 4.0–10.5)
nRBC: 0 % (ref 0.0–0.2)

## 2018-09-27 LAB — I-STAT BETA HCG BLOOD, ED (MC, WL, AP ONLY): I-stat hCG, quantitative: <5 m[IU]/mL (ref <5)

## 2018-09-27 LAB — TYPE AND SCREEN
ABO/RH(D): O POS
Antibody Screen: NEGATIVE

## 2018-09-27 LAB — LIPASE, BLOOD: Lipase: 23 U/L (ref 11–51)

## 2018-09-27 LAB — SARS CORONAVIRUS 2 BY RT PCR (HOSPITAL ORDER, PERFORMED IN ~~LOC~~ HOSPITAL LAB): SARS Coronavirus 2: NEGATIVE

## 2018-09-27 SURGERY — Surgical Case
Anesthesia: *Unknown

## 2018-09-27 SURGERY — DILATION AND CURETTAGE
Anesthesia: General | Site: Cervix

## 2018-09-27 MED ORDER — FENTANYL CITRATE (PF) 100 MCG/2ML IJ SOLN
INTRAMUSCULAR | Status: AC
  Filled 2018-09-27: qty 2

## 2018-09-27 MED ORDER — MIDAZOLAM HCL 2 MG/2ML IJ SOLN
INTRAMUSCULAR | Status: AC
  Filled 2018-09-27: qty 2

## 2018-09-27 MED ORDER — FENTANYL CITRATE (PF) 100 MCG/2ML IJ SOLN
INTRAMUSCULAR | Status: DC | PRN
  Administered 2018-09-27 (×2): 50 ug via INTRAVENOUS

## 2018-09-27 MED ORDER — LIDOCAINE-EPINEPHRINE 1 %-1:100000 IJ SOLN
INTRAMUSCULAR | Status: DC | PRN
  Administered 2018-09-27: 10 mL

## 2018-09-27 MED ORDER — IOHEXOL 300 MG/ML  SOLN
100.0000 mL | Freq: Once | INTRAMUSCULAR | Status: AC | PRN
  Administered 2018-09-27: 13:00:00 100 mL via INTRAVENOUS

## 2018-09-27 MED ORDER — METOCLOPRAMIDE HCL 5 MG/ML IJ SOLN: 10.0000 mg | Freq: Once | INTRAMUSCULAR | Status: DC | PRN

## 2018-09-27 MED ORDER — 0.9 % SODIUM CHLORIDE (POUR BTL) OPTIME
TOPICAL | Status: DC | PRN
  Administered 2018-09-27: 1000 mL

## 2018-09-27 MED ORDER — ONDANSETRON HCL 4 MG/2ML IJ SOLN
4.0000 mg | INTRAMUSCULAR | Status: DC | PRN
  Administered 2018-09-27: 4 mg via INTRAVENOUS
  Filled 2018-09-27: qty 2

## 2018-09-27 MED ORDER — LACTATED RINGERS IV SOLN
INTRAVENOUS | Status: DC
  Administered 2018-09-27: 17:00:00 via INTRAVENOUS

## 2018-09-27 MED ORDER — LIDOCAINE 2% (20 MG/ML) 5 ML SYRINGE
INTRAMUSCULAR | Status: DC | PRN
  Administered 2018-09-27: 60 mg via INTRAVENOUS

## 2018-09-27 MED ORDER — SODIUM CHLORIDE (PF) 0.9 % IJ SOLN
INTRAMUSCULAR | Status: AC
  Filled 2018-09-27: qty 50

## 2018-09-27 MED ORDER — PROPOFOL 10 MG/ML IV BOLUS
INTRAVENOUS | Status: AC
  Filled 2018-09-27: qty 20

## 2018-09-27 MED ORDER — PROPOFOL 10 MG/ML IV BOLUS
INTRAVENOUS | Status: DC | PRN
  Administered 2018-09-27: 180 mg via INTRAVENOUS

## 2018-09-27 MED ORDER — MEPERIDINE HCL 50 MG/ML IJ SOLN: 6.2500 mg | INTRAMUSCULAR | Status: DC | PRN

## 2018-09-27 MED ORDER — DEXAMETHASONE SODIUM PHOSPHATE 10 MG/ML IJ SOLN
INTRAMUSCULAR | Status: DC | PRN
  Administered 2018-09-27: 10 mg via INTRAVENOUS

## 2018-09-27 MED ORDER — FENTANYL CITRATE (PF) 100 MCG/2ML IJ SOLN: 25.0000 ug | INTRAMUSCULAR | Status: DC | PRN

## 2018-09-27 MED ORDER — LIDOCAINE-EPINEPHRINE 1 %-1:100000 IJ SOLN
INTRAMUSCULAR | Status: AC
  Filled 2018-09-27: qty 1

## 2018-09-27 MED ORDER — OXYCODONE-ACETAMINOPHEN 5-325 MG PO TABS: 1.0000 | ORAL_TABLET | Freq: Four times a day (QID) | ORAL | 0 refills | Status: DC | PRN

## 2018-09-27 MED ORDER — ONDANSETRON HCL 4 MG/2ML IJ SOLN
INTRAMUSCULAR | Status: DC | PRN
  Administered 2018-09-27: 4 mg via INTRAVENOUS

## 2018-09-27 MED ORDER — ACETAMINOPHEN 10 MG/ML IV SOLN
INTRAVENOUS | Status: AC
  Filled 2018-09-27: qty 100

## 2018-09-27 MED ORDER — IBUPROFEN 600 MG PO TABS: 600.0000 mg | ORAL_TABLET | Freq: Four times a day (QID) | ORAL | 1 refills | Status: DC | PRN

## 2018-09-27 MED ORDER — DEXMEDETOMIDINE HCL IN NACL 200 MCG/50ML IV SOLN
INTRAVENOUS | Status: AC
  Filled 2018-09-27: qty 50

## 2018-09-27 MED ORDER — DEXMEDETOMIDINE HCL IN NACL 200 MCG/50ML IV SOLN
INTRAVENOUS | Status: DC | PRN
  Administered 2018-09-27: 12 ug via INTRAVENOUS

## 2018-09-27 MED ORDER — LIDOCAINE 2% (20 MG/ML) 5 ML SYRINGE
INTRAMUSCULAR | Status: AC
  Filled 2018-09-27: qty 5

## 2018-09-27 MED ORDER — KETOROLAC TROMETHAMINE 30 MG/ML IJ SOLN
30.0000 mg | Freq: Once | INTRAMUSCULAR | Status: AC
  Administered 2018-09-27: 30 mg via INTRAVENOUS

## 2018-09-27 MED ORDER — ACETAMINOPHEN 10 MG/ML IV SOLN
1000.0000 mg | Freq: Once | INTRAVENOUS | Status: AC
  Administered 2018-09-27: 1000 mg via INTRAVENOUS

## 2018-09-27 MED ORDER — KETOROLAC TROMETHAMINE 30 MG/ML IJ SOLN
INTRAMUSCULAR | Status: AC
  Filled 2018-09-27: qty 1

## 2018-09-27 MED ORDER — LACTATED RINGERS IV SOLN: INTRAVENOUS | Status: DC

## 2018-09-27 MED ORDER — PHENYLEPHRINE 40 MCG/ML (10ML) SYRINGE FOR IV PUSH (FOR BLOOD PRESSURE SUPPORT)
PREFILLED_SYRINGE | INTRAVENOUS | Status: DC | PRN
  Administered 2018-09-27 (×3): 80 ug via INTRAVENOUS

## 2018-09-27 MED ORDER — HYDROMORPHONE HCL 1 MG/ML IJ SOLN
1.0000 mg | Freq: Once | INTRAMUSCULAR | Status: DC
  Filled 2018-09-27: qty 1

## 2018-09-27 MED ORDER — MIDAZOLAM HCL 5 MG/5ML IJ SOLN
INTRAMUSCULAR | Status: DC | PRN
  Administered 2018-09-27: 2 mg via INTRAVENOUS

## 2018-09-27 MED ORDER — DEXAMETHASONE SODIUM PHOSPHATE 10 MG/ML IJ SOLN
INTRAMUSCULAR | Status: AC
  Filled 2018-09-27: qty 1

## 2018-09-27 MED ORDER — PHENYLEPHRINE 40 MCG/ML (10ML) SYRINGE FOR IV PUSH (FOR BLOOD PRESSURE SUPPORT)
PREFILLED_SYRINGE | INTRAVENOUS | Status: AC
  Filled 2018-09-27: qty 10

## 2018-09-27 MED ORDER — FENTANYL CITRATE (PF) 100 MCG/2ML IJ SOLN
50.0000 ug | INTRAMUSCULAR | Status: DC | PRN
  Administered 2018-09-27: 50 ug via INTRAVENOUS
  Filled 2018-09-27: qty 2

## 2018-09-27 MED ORDER — ONDANSETRON HCL 4 MG/2ML IJ SOLN
INTRAMUSCULAR | Status: AC
  Filled 2018-09-27: qty 2

## 2018-09-27 SURGICAL SUPPLY — 8 items
CATH ROBINSON RED A/P 16FR (CATHETERS) ×3 IMPLANT
COVER SURGICAL LIGHT HANDLE (MISCELLANEOUS) ×3 IMPLANT
COVER WAND RF STERILE (DRAPES) IMPLANT
GLOVE BIO SURGEON STRL SZ 6.5 (GLOVE) ×2 IMPLANT
GLOVE BIO SURGEONS STRL SZ 6.5 (GLOVE) ×1
KIT TURNOVER KIT A (KITS) ×3 IMPLANT
UNDERPAD 30X30 (UNDERPADS AND DIAPERS) ×3 IMPLANT
WATER STERILE IRR 1000ML POUR (IV SOLUTION) ×3 IMPLANT

## 2018-09-27 NOTE — Anesthesia Procedure Notes (Signed)
Procedure Name: LMA Insertion Date/Time: 09/27/2018 4:46 PM Performed by: Mitzie Na, CRNA Pre-anesthesia Checklist: Patient identified, Emergency Drugs available, Suction available, Patient being monitored and Timeout performed Patient Re-evaluated:Patient Re-evaluated prior to induction Oxygen Delivery Method: Circle system utilized Preoxygenation: Pre-oxygenation with 100% oxygen Induction Type: IV induction LMA: LMA inserted LMA Size: 3.0 Number of attempts: 1 Placement Confirmation: CO2 detector and breath sounds checked- equal and bilateral Tube secured with: Tape

## 2018-09-27 NOTE — ED Notes (Signed)
ED Provider at bedside. 

## 2018-09-27 NOTE — Op Note (Signed)
PREOPERATIVE DIAGNOSIS:  Cervical stenosis with hematometra POSTOPERATIVE DIAGNOSIS: The same PROCEDURE: Dilation of stenotic cervix SURGEON:  Dr. Emeterio Reeve   INDICATIONS: 38 y.o. [id number]  here for scheduled surgery for the aforementioned diagnoses.   Risks of surgery were discussed with the patient including but not limited to: bleeding which may require transfusion; infection which may require antibiotics; injury to uterus or surrounding organs; intrauterine scarring which may impair future fertility; need for additional procedures including laparotomy or laparoscopy; and other postoperative/anesthesia complications. Written informed consent was obtained.    FINDINGS:  A 8 week size uterus. Stenotic cervix, drainage of old uterine blood ANESTHESIA:   LMA, paracervical block with 10 ml of 1% lidocaine with epinephrine INTRAVENOUS FLUIDS:  1000 ml of LR ESTIMATED BLOOD LOSS:  Less than 20 ml, 60 ml of old blood COMPLICATIONS:  None immediate.  PROCEDURE DETAILS:  The patient was then taken to the operating room where LMA anesthesia was administered and was found to be adequate.  After an adequate timeout was performed, she was placed in the dorsal lithotomy position and examined; then prepped and draped in the sterile manner.   Her bladder was catheterized for an unmeasured amount of clear, yellow urine. A speculum was then placed in the patient's vagina and a single tooth tenaculum was applied to the anterior lip of the cervix.   A paracervical block was administered.  The cervix was found to be scarred and stenotic from CKC performed last year. Lacrimal dilators were used to locate the os and the canal was dilated manually with metal dilators up to number 43 Pratt.   Copious old blood drained from the uterus, estimated to be 60 ml.The tenaculum was removed from the anterior lip of the cervix and the vaginal speculum was removed after noting good hemostasis.  The patient tolerated the procedure  well and was taken to the recovery area awake, extubated and in stable condition.  The patient will be discharged to home as per PACU criteria.  Routine postoperative instructions given.  She was prescribed Percocet, Ibuprofen.  She will follow up in the clinic in 4 weeks for postoperative evaluation.  Woodroe Mode, MD 09/27/2018 5:42 PM

## 2018-09-27 NOTE — H&P (Signed)
Margaret Cameron is an 38 y.o. female. [id number] She had a cervical conization procedure 1 year ago at the [place] for cervical dysplasia and had done well until 2 weeks ago when she began to have pelvic pain and now has had nausea and vomiting. [place] and CT shows probable hematometra secondary to cervical stenosis.  Pertinent Gynecological History: Menses: no bleeding for one year Bleeding: none Contraception: Nexplanon DES exposure: denies Blood transfusions: none Sexually transmitted diseases: no past history Previous GYN Procedures: CKC   Menstrual History:  No LMP recorded. Patient has had an implant.    Past Medical History:  Diagnosis Date  . Abnormal Pap smear of cervix    LEEP 2011  . Anemia    with pregnancy  . Chronic headaches   . Cyst near coccyx   . Hypoglycemia   . Migraine   . Pilonidal cyst   . Pyelonephritis    x 3 episodes  . Sickle cell trait [facility])     Past Surgical History:  Procedure Laterality Date  . CERVICAL CONE BIOPSY    . CERVICAL CONIZATION W/BX  2019  . DILATION AND CURETTAGE OF UTERUS  2008  . LEEP  2011  . OTHER SURGICAL HISTORY     Pynlodial cyst removed from tail bone 2012  . OTHER SURGICAL HISTORY  10/2010   removal of cyst near coxxyx  . PILONIDAL CYST EXCISION  10/2010    Family History  Problem Relation Age of Onset  . Hypertension Brother   . Cancer Maternal Aunt        breast    Social History:  reports that she quit smoking about 4 years ago. Her smoking use included cigarettes. She smoked 0.10 packs per day. She has never used smokeless tobacco. She reports that she does not drink alcohol or use drugs.  Allergies:  Allergies  Allergen Reactions  . Codeine Rash  . Septra [Bactrim] Rash    (Not in a hospital admission)   Review of Systems  Respiratory: Negative.   Genitourinary:       Pelvic pain, no bleeding  All other systems reviewed and are negative.   Blood pressure (!) 110/55, pulse (!) 58, temperature 98.3 F  (36.8 C), temperature source Oral, resp. rate 16, height 5\' 6"  (1.676 m), weight 80.7 kg, SpO2 100 %. Physical Exam  Vitals reviewed. Constitutional: She appears well-developed. No distress.  Neck: Normal range of motion.  Respiratory: Effort normal.  GI: Soft. She exhibits no distension.  Psychiatric: She has a normal mood and affect. Her behavior is normal.    Results for orders placed or performed during the hospital encounter of 09/27/18 (from the past 24 hour(s))  Comprehensive metabolic panel     Status: Abnormal   Collection Time: 09/27/18 10:55 AM  Result Value Ref Range   Sodium 137 135 - 145 mmol/L   Potassium 4.4 3.5 - 5.1 mmol/L   Chloride 109 98 - 111 mmol/L   CO2 19 (L) 22 - 32 mmol/L   Glucose, Bld 109 (H) 70 - 99 mg/dL   BUN 8 6 - 20 mg/dL   Creatinine, Ser 0.85 0.44 - 1.00 mg/dL   Calcium 9.2 8.9 - 10.3 mg/dL   Total Protein 7.7 6.5 - 8.1 g/dL   Albumin 4.9 3.5 - 5.0 g/dL   AST 29 15 - 41 U/L   ALT 14 0 - 44 U/L   Alkaline Phosphatase 45 38 - 126 U/L   Total Bilirubin 1.3 (H) 0.3 -  1.2 mg/dL   GFR calc non Af Amer >60 >60 mL/min   GFR calc Af Amer >60 >60 mL/min   Anion gap 9 5 - 15  Lipase, blood     Status: None   Collection Time: 09/27/18 10:55 AM  Result Value Ref Range   Lipase 23 11 - 51 U/L  CBC with Differential     Status: Abnormal   Collection Time: 09/27/18 10:55 AM  Result Value Ref Range   WBC 11.8 (H) 4.0 - 10.5 K/uL   RBC 4.47 3.87 - 5.11 MIL/uL   Hemoglobin 13.5 12.0 - 15.0 g/dL   HCT 41.3 36.0 - 46.0 %   MCV 92.4 80.0 - 100.0 fL   MCH 30.2 26.0 - 34.0 pg   MCHC 32.7 30.0 - 36.0 g/dL   RDW 13.3 11.5 - 15.5 %   Platelets 238 150 - 400 K/uL   nRBC 0.0 0.0 - 0.2 %   Neutrophils Relative % 61 %   Neutro Abs 7.1 1.7 - 7.7 K/uL   Lymphocytes Relative 34 %   Lymphs Abs 4.1 (H) 0.7 - 4.0 K/uL   Monocytes Relative 4 %   Monocytes Absolute 0.5 0.1 - 1.0 K/uL   Eosinophils Relative 1 %   Eosinophils Absolute 0.1 0.0 - 0.5 K/uL   Basophils  Relative 0 %   Basophils Absolute 0.0 0.0 - 0.1 K/uL   Immature Granulocytes 0 %   Abs Immature Granulocytes 0.02 0.00 - 0.07 K/uL  Urinalysis, Routine w reflex microscopic     Status: Abnormal   Collection Time: 09/27/18 10:55 AM  Result Value Ref Range   Color, Urine YELLOW YELLOW   APPearance CLEAR CLEAR   Specific Gravity, Urine 1.014 1.005 - 1.030   pH 5.0 5.0 - 8.0   Glucose, UA NEGATIVE NEGATIVE mg/dL   Hgb urine dipstick MODERATE (A) NEGATIVE   Bilirubin Urine NEGATIVE NEGATIVE   Ketones, ur NEGATIVE NEGATIVE mg/dL   Protein, ur NEGATIVE NEGATIVE mg/dL   Nitrite NEGATIVE NEGATIVE   Leukocytes,Ua NEGATIVE NEGATIVE   RBC / HPF 0-5 0 - 5 RBC/hpf   WBC, UA 0-5 0 - 5 WBC/hpf   Bacteria, UA NONE SEEN NONE SEEN   Squamous Epithelial / LPF 0-5 0 - 5   Mucus PRESENT   I-Stat beta hCG blood, ED     Status: None   Collection Time: 09/27/18 10:59 AM  Result Value Ref Range   I-stat hCG, quantitative <5.0 <5 mIU/mL   Comment 3            US Pelvis Transvanginal Non-ob (tv Only)  Result Date: 09/27/2018 CLINICAL DATA:  Worsening right pelvic pain for 2 weeks. Previous cervical cone biopsy and LEEP procedure. EXAM: TRANSABDOMINAL AND TRANSVAGINAL ULTRASOUND OF PELVIS DOPPLER ULTRASOUND OF OVARIES TECHNIQUE: Both transabdominal and transvaginal ultrasound examinations of the pelvis were performed. Transabdominal technique was performed for global imaging of the pelvis including uterus, ovaries, adnexal regions, and pelvic cul-de-sac. It was necessary to proceed with endovaginal exam following the transabdominal exam to visualize the endometrium and ovaries. Color and duplex Doppler ultrasound was utilized to evaluate blood flow to the ovaries. COMPARISON:  None. FINDINGS: Uterus Measurements: 9.3 x 6.2 x 7 5 cm = volume: 227 mL. No fibroids or other mass visualized. Endometrium Thickness: Markedly distended endometrial cavity filled with complex fluid, consistent with hematometros. No  obstructing mass identified lower uterine segment or cervix. Right ovary Measurements: 4.3 x 2.5 x 3.0 cm = volume: 17.2 mL. Normal  appearance/no adnexal mass. Left ovary Measurements: 3.4 x 1.7 x 2.0 cm = volume: 6.0 mL. Normal appearance/no adnexal mass. Pulsed Doppler evaluation of both ovaries demonstrates normal low-resistance arterial and venous waveforms. Other findings No abnormal free fluid. 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 No obstructing mass identified. Normal appearance of both ovaries.  No adnexal mass identified. Electronically Signed   By: Earle Gell M.D.   On: 09/27/2018 12:35   US Pelvis Complete  Result Date: 09/27/2018 CLINICAL DATA:  Worsening right pelvic pain for 2 weeks. Previous cervical cone biopsy and LEEP procedure. EXAM: TRANSABDOMINAL AND TRANSVAGINAL ULTRASOUND OF PELVIS DOPPLER ULTRASOUND OF OVARIES TECHNIQUE: Both transabdominal and transvaginal ultrasound examinations of the pelvis were performed. Transabdominal technique was performed for global imaging of the pelvis including uterus, ovaries, adnexal regions, and pelvic cul-de-sac. It was necessary to proceed with endovaginal exam following the transabdominal exam to visualize the endometrium and ovaries. Color and duplex Doppler ultrasound was utilized to evaluate blood flow to the ovaries. COMPARISON:  None. FINDINGS: Uterus Measurements: 9.3 x 6.2 x 7 5 cm = volume: 227 mL. No fibroids or other mass visualized. Endometrium Thickness: Markedly distended endometrial cavity filled with complex fluid, consistent with hematometros. No obstructing mass identified lower uterine segment or cervix. Right ovary Measurements: 4.3 x 2.5 x 3.0 cm = volume: 17.2 mL. Normal appearance/no adnexal mass. Left ovary Measurements: 3.4 x 1.7 x 2.0 cm = volume: 6.0 mL. Normal appearance/no adnexal mass. Pulsed Doppler evaluation of both ovaries demonstrates normal low-resistance arterial and venous waveforms. Other  findings No abnormal free fluid. 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 No obstructing mass identified. Normal appearance of both ovaries.  No adnexal mass identified. Electronically Signed   By: Earle Gell M.D.   On: 09/27/2018 12:35   Ct Abdomen Pelvis W Contrast  Result Date: 09/27/2018 CLINICAL DATA:  Lower abdominopelvic pain x2 weeks EXAM: CT ABDOMEN AND PELVIS WITH CONTRAST TECHNIQUE: Multidetector CT imaging of the abdomen and pelvis was performed using the standard protocol following bolus administration of intravenous contrast. CONTRAST:  151mL OMNIPAQUE IOHEXOL 300 MG/ML  SOLN COMPARISON:  Pelvic ultrasound dated 09/27/2018 FINDINGS: Lower chest: Lung bases are clear. Hepatobiliary: Liver is within normal limits. Gallbladder is unremarkable. No intrahepatic or extrahepatic ductal dilatation. Pancreas: Within normal limits. Spleen: Within normal limits. Adrenals/Urinary Tract: Adrenal glands are within normal limits. Kidneys are within normal limits.  No hydronephrosis. Bladder is within normal limits. Stomach/Bowel: Stomach is within normal limits. No evidence of bowel obstruction. Normal appendix (series 2/image 61). Vascular/Lymphatic: No evidence of abdominal aortic aneurysm. No suspicious abdominopelvic lymphadenopathy. Reproductive: 1.7 cm intramural fibroid in the left uterine body (series 2/image 56). Dilated endometrial cavity with fluid/hemorrhage when correlating with recent pelvic ultrasound, suggesting hematometros, most likely secondary to cervical stenosis. No obstructing mass is evident on CT. Bilateral ovaries are unremarkable. Other: No abdominopelvic ascites. Musculoskeletal: Visualized osseous structures are within normal limits. IMPRESSION: Suspected hematometros, most likely secondary to cervical stenosis. No obstructing mass is evident on CT. Consider GYN consultation for direct visualization/evaluation. 1.7 cm intramural fibroid in the left uterine body.  Electronically Signed   By: Julian Hy M.D.   On: 09/27/2018 13:30   US Pelvic Doppler (torsion R/o Or Mass Arterial Flow)  Result Date: 09/27/2018 CLINICAL DATA:  Worsening right pelvic pain for 2 weeks. Previous cervical cone biopsy and LEEP procedure. EXAM: TRANSABDOMINAL AND TRANSVAGINAL ULTRASOUND OF PELVIS DOPPLER ULTRASOUND OF OVARIES TECHNIQUE: Both transabdominal and transvaginal ultrasound examinations of the  pelvis were performed. Transabdominal technique was performed for global imaging of the pelvis including uterus, ovaries, adnexal regions, and pelvic cul-de-sac. It was necessary to proceed with endovaginal exam following the transabdominal exam to visualize the endometrium and ovaries. Color and duplex Doppler ultrasound was utilized to evaluate blood flow to the ovaries. COMPARISON:  None. FINDINGS: Uterus Measurements: 9.3 x 6.2 x 7 5 cm = volume: 227 mL. No fibroids or other mass visualized. Endometrium Thickness: Markedly distended endometrial cavity filled with complex fluid, consistent with hematometros. No obstructing mass identified lower uterine segment or cervix. Right ovary Measurements: 4.3 x 2.5 x 3.0 cm = volume: 17.2 mL. Normal appearance/no adnexal mass. Left ovary Measurements: 3.4 x 1.7 x 2.0 cm = volume: 6.0 mL. Normal appearance/no adnexal mass. Pulsed Doppler evaluation of both ovaries demonstrates normal low-resistance arterial and venous waveforms. Other findings No abnormal free fluid. 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 No obstructing mass identified. Normal appearance of both ovaries.  No adnexal mass identified. Electronically Signed   By: Earle Gell M.D.   On: 09/27/2018 12:35    Assessment/Plan: Pelvic pain with probable cervical stenosis and hematometra. I recommend D&C. Patient desires surgical management  The risks of surgery were discussed in detail with the patient including but not limited to: bleeding which may require  transfusion or reoperation; infection which may require prolonged hospitalization or re-hospitalization and antibiotic therapy; injury to bowel, bladder, ureters and major vessels or other surrounding organs; need for additional procedures including laparotomy; thromboembolic phenomenon, incisional problems and other postoperative or anesthesia complications.  Patient was told that the likelihood that her condition and symptoms will be treated effectively with this surgical management was very high; the postoperative expectations were also discussed in detail. The patient also understands the alternative treatment options which were discussed in full. All questions were answered.     Emeterio Reeve 09/27/2018, 2:49 PM

## 2018-09-27 NOTE — Anesthesia Preprocedure Evaluation (Signed)
Anesthesia Evaluation  Patient identified by MRN, date of birth, ID band Patient awake    Reviewed: Allergy & Precautions, NPO status , Patient's Chart, lab work & pertinent test results  Airway Mallampati: II  TM Distance: >3 FB Neck ROM: Full    Dental no notable dental hx.    Pulmonary neg pulmonary ROS, former smoker,    Pulmonary exam normal breath sounds clear to auscultation       Cardiovascular negative cardio ROS Normal cardiovascular exam Rhythm:Regular Rate:Normal     Neuro/Psych negative neurological ROS  negative psych ROS   GI/Hepatic negative GI ROS, Neg liver ROS, hiatal hernia, neg GERD  ,  Endo/Other  negative endocrine ROS  Renal/GU negative Renal ROS  negative genitourinary   Musculoskeletal negative musculoskeletal ROS (+)   Abdominal   Peds negative pediatric ROS (+)  Hematology negative hematology ROS (+)   Anesthesia Other Findings   Reproductive/Obstetrics negative OB ROS                            Anesthesia Physical Anesthesia Plan  ASA: I  Anesthesia Plan: General   Post-op Pain Management:    Induction: Intravenous  PONV Risk Score and Plan: 4 or greater and Ondansetron, Dexamethasone, Treatment may vary due to age or medical condition and Midazolam  Airway Management Planned: LMA  Additional Equipment:   Intra-op Plan:   Post-operative Plan: Extubation in OR  Informed Consent: I have reviewed the patients History and Physical, chart, labs and discussed the procedure including the risks, benefits and alternatives for the proposed anesthesia with the patient or authorized representative who has indicated his/her understanding and acceptance.     Dental advisory given  Plan Discussed with: CRNA  Anesthesia Plan Comments:         Anesthesia Quick Evaluation

## 2018-09-27 NOTE — Discharge Instructions (Signed)
Dilation and Curettage or Vacuum Curettage, Care After These instructions give you information about caring for yourself after your procedure. Your doctor may also give you more specific instructions. Call your doctor if you have any problems or questions after your procedure. Follow these instructions at home: Activity  Do not drive or use heavy machinery while taking prescription pain medicine.  For 24 hours after your procedure, avoid driving.  Take short walks often, followed by rest periods. Ask your doctor what activities are safe for you. After one or two days, you may be able to return to your normal activities.  For at least 2 weeks, or as long as told by your doctor: ? Do not douche. ? Do not use tampons. ? Do not have sex. General instructions   Take over-the-counter and prescription medicines only as told by your doctor. This is very important if you take blood thinning medicine.  Do not take baths, swim, or use a hot tub until your doctor approves. Take showers instead of baths.  Wear compression stockings as told by your doctor.  It is up to you to get the results of your procedure. Ask your doctor when your results will be ready.  Keep all follow-up visits as told by your doctor. This is important. Contact a doctor if:  You have very bad cramps that get worse or do not get better with medicine.  You have very bad pain in your belly (abdomen).  You cannot drink fluids without throwing up (vomiting).  You get pain in a different part of the area between your belly and thighs (pelvis).  You have bad-smelling discharge from your vagina.  You have a rash. Get help right away if:  You are bleeding a lot from your vagina. A lot of bleeding means soaking more than one sanitary pad in an hour, for 2 hours in a row.  You have clumps of blood (blood clots) coming from your vagina.  You have a fever or chills.  Your belly feels very tender or hard.  You have chest  pain.  You have trouble breathing.  You cough up blood.  You feel dizzy.  You feel light-headed.  You pass out (faint).  You have pain in your neck or shoulder area. Summary  Take short walks often, followed by rest periods. Ask your doctor what activities are safe for you. After one or two days, you may be able to return to your normal activities.  Do not lift anything that is heavier than 10 lb (4.5 kg) until your doctor approves.  Do not take baths, swim, or use a hot tub until your doctor approves. Take showers instead of baths.  Contact your doctor if you have any symptoms of infection, like bad-smelling discharge from your vagina. This information is not intended to replace advice given to you by your health care provider. Make sure you discuss any questions you have with your health care provider. Document Released: 02/16/2008 Document Revised: 01/25/2016 Document Reviewed: 01/25/2016 Elsevier Interactive Patient Education  2019 [employer].

## 2018-09-27 NOTE — ED Provider Notes (Signed)
[facility] DEPT Provider Note   CSN: [id number] Arrival date & time: 09/27/18  1026    History   Chief Complaint Chief Complaint  Patient presents with   Abdominal Pain   Constipation   Emesis    HPI Margaret Cameron is a 38 y.o. female.     HPI Pt was seen at 1035.  Per pt, c/o gradual onset and persistence of constant right sided abd "pain" for the past 2 weeks.  Has been associated with multiple intermittent episodes of N/V.  Describes the abd pain as "sharp," with radiation into her lower back. Denies cough, known COVID+ exposure. Denies diarrhea, no fevers, no flank pain, no rash, no CP/SOB, no black or blood in stools or emesis, no dysuria/hematuria, no vaginal bleeding/discharge.      Past Medical History:  Diagnosis Date   Abnormal Pap smear of cervix    LEEP 2011   Anemia    with pregnancy   Chronic headaches    Cyst near coccyx    Hypoglycemia    Migraine    Pilonidal cyst    Pyelonephritis    x 3 episodes   Sickle cell trait [facility])     Patient Active Problem List   Diagnosis Date Noted   Severe dysplasia of cervix (CIN III) 08/31/2017   Hx of pyelonephritis 12/09/2012   Anemia 12/09/2012    Past Surgical History:  Procedure Laterality Date   CERVICAL CONE BIOPSY     CERVICAL CONIZATION W/BX  2019   DILATION AND CURETTAGE OF UTERUS  2008   LEEP  2011   OTHER SURGICAL HISTORY     Pynlodial cyst removed from tail bone 2012   OTHER SURGICAL HISTORY  10/2010   removal of cyst near coxxyx   PILONIDAL CYST EXCISION  10/2010     OB History    Gravida  5   Para  2   Term  2   Preterm  0   AB  3   Living  2     SAB  2   TAB  1   Ectopic  0   Multiple  0   Live Births  2            Home Medications    Prior to Admission medications   Not on File    Family History Family History  Problem Relation Age of Onset   Hypertension Brother    Cancer Maternal Aunt         breast    Social History Social History   Tobacco Use   Smoking status: Former Smoker    Packs/day: 0.10    Types: Cigarettes    Last attempt to quit: 07/14/2014    Years since quitting: 4.2   Smokeless tobacco: Never Used  Substance Use Topics   Alcohol use: No   Drug use: No     Allergies   Sulfamethoxazole-trimethoprim; Codeine; and Septra [bactrim]   Review of Systems Review of Systems ROS: Statement: All systems negative except as marked or noted in the HPI; Constitutional: Negative for fever and chills. ; ; Eyes: Negative for eye pain, redness and discharge. ; ; ENMT: Negative for ear pain, hoarseness, nasal congestion, sinus pressure and sore throat. ; ; Cardiovascular: Negative for chest pain, palpitations, diaphoresis, dyspnea and peripheral edema. ; ; Respiratory: Negative for cough, wheezing and stridor. ; ; Gastrointestinal: +N/V, abd pain. Negative for diarrhea, blood in stool, hematemesis, jaundice and rectal bleeding. . ; ;  Genitourinary: Negative for dysuria, flank pain and hematuria. ; ; GYN:  No vaginal bleeding, no vaginal discharge, no vulvar pain. ;;  Musculoskeletal: Negative for back pain and neck pain. Negative for swelling and trauma.; ; Skin: Negative for pruritus, rash, abrasions, blisters, bruising and skin lesion.; ; Neuro: Negative for headache, lightheadedness and neck stiffness. Negative for weakness, altered level of consciousness, altered mental status, extremity weakness, paresthesias, involuntary movement, seizure and syncope.       Physical Exam Updated Vital Signs BP 138/77    Pulse 96    Temp 98.3 F (36.8 C) (Oral)    Resp 16    Ht 5\' 6"  (1.676 m)    Wt 80.7 kg    SpO2 100%    BMI 28.73 kg/m    Patient Vitals for the past 24 hrs:  BP Temp Temp src Pulse Resp SpO2 Height Weight  09/27/18 1100 138/77 -- -- 96 16 100 % -- --  09/27/18 1040 (!) 150/71 98.3 F (36.8 C) Oral 94 20 100 % -- --  09/27/18 1039 -- -- -- -- -- -- 5\' 6"  (1.676  m) 80.7 kg      Physical Exam 1040: Physical examination:  Nursing notes reviewed; Vital signs and O2 SAT reviewed;  Constitutional: Well developed, Well nourished, Well hydrated, Uncomfortable appearing.; Head:  Normocephalic, atraumatic; Eyes: EOMI, PERRL, No scleral icterus; ENMT: Mouth and pharynx normal, Mucous membranes moist; Neck: Supple, Full range of motion, No lymphadenopathy; Cardiovascular: Regular rate and rhythm, No gallop; Respiratory: Breath sounds clear & equal bilaterally, No wheezes.  Speaking full sentences with ease, Normal respiratory effort/excursion; Chest: Nontender, Movement normal; Abdomen: Soft, +RUQ,RLQ, suprapubic areas tender to palp.  Nondistended, Normal bowel sounds; Genitourinary: No CVA tenderness; Spine:  No midline CS, TS, LS tenderness. No rash.;; Extremities: Peripheral pulses normal, No tenderness, No edema, No calf edema or asymmetry.; Neuro: AA&Ox3, Major CN grossly intact.  Speech clear. No gross focal motor or sensory deficits in extremities.; Skin: Color normal, Warm, diaphoretic.     ED Treatments / Results  Labs (all labs ordered are listed, but only abnormal results are displayed)   EKG None  Radiology   Procedures Procedures (including critical care time)  Medications Ordered in ED Medications  ondansetron (ZOFRAN) injection 4 mg (4 mg Intravenous Given 09/27/18 1054)  fentaNYL (SUBLIMAZE) injection 50 mcg (50 mcg Intravenous Given 09/27/18 1054)     Initial Impression / Assessment and Plan / ED Course  I have reviewed the triage vital signs and the nursing notes.  Pertinent labs & imaging results that were available during my care of the patient were reviewed by me and considered in my medical decision making (see chart for details).     MDM Reviewed: previous chart, nursing note and vitals Reviewed previous: labs Interpretation: labs, ultrasound and CT scan   Results for orders placed or performed during the hospital  encounter of 09/27/18  Comprehensive metabolic panel  Result Value Ref Range   Sodium 137 135 - 145 mmol/L   Potassium 4.4 3.5 - 5.1 mmol/L   Chloride 109 98 - 111 mmol/L   CO2 19 (L) 22 - 32 mmol/L   Glucose, Bld 109 (H) 70 - 99 mg/dL   BUN 8 6 - 20 mg/dL   Creatinine, Ser 0.85 0.44 - 1.00 mg/dL   Calcium 9.2 8.9 - 10.3 mg/dL   Total Protein 7.7 6.5 - 8.1 g/dL   Albumin 4.9 3.5 - 5.0 g/dL   AST 29 15 -  41 U/L   ALT 14 0 - 44 U/L   Alkaline Phosphatase 45 38 - 126 U/L   Total Bilirubin 1.3 (H) 0.3 - 1.2 mg/dL   GFR calc non Af Amer >60 >60 mL/min   GFR calc Af Amer >60 >60 mL/min   Anion gap 9 5 - 15  Lipase, blood  Result Value Ref Range   Lipase 23 11 - 51 U/L  CBC with Differential  Result Value Ref Range   WBC 11.8 (H) 4.0 - 10.5 K/uL   RBC 4.47 3.87 - 5.11 MIL/uL   Hemoglobin 13.5 12.0 - 15.0 g/dL   HCT 41.3 36.0 - 46.0 %   MCV 92.4 80.0 - 100.0 fL   MCH 30.2 26.0 - 34.0 pg   MCHC 32.7 30.0 - 36.0 g/dL   RDW 13.3 11.5 - 15.5 %   Platelets 238 150 - 400 K/uL   nRBC 0.0 0.0 - 0.2 %   Neutrophils Relative % 61 %   Neutro Abs 7.1 1.7 - 7.7 K/uL   Lymphocytes Relative 34 %   Lymphs Abs 4.1 (H) 0.7 - 4.0 K/uL   Monocytes Relative 4 %   Monocytes Absolute 0.5 0.1 - 1.0 K/uL   Eosinophils Relative 1 %   Eosinophils Absolute 0.1 0.0 - 0.5 K/uL   Basophils Relative 0 %   Basophils Absolute 0.0 0.0 - 0.1 K/uL   Immature Granulocytes 0 %   Abs Immature Granulocytes 0.02 0.00 - 0.07 K/uL  Urinalysis, Routine w reflex microscopic  Result Value Ref Range   Color, Urine YELLOW YELLOW   APPearance CLEAR CLEAR   Specific Gravity, Urine 1.014 1.005 - 1.030   pH 5.0 5.0 - 8.0   Glucose, UA NEGATIVE NEGATIVE mg/dL   Hgb urine dipstick MODERATE (A) NEGATIVE   Bilirubin Urine NEGATIVE NEGATIVE   Ketones, ur NEGATIVE NEGATIVE mg/dL   Protein, ur NEGATIVE NEGATIVE mg/dL   Nitrite NEGATIVE NEGATIVE   Leukocytes,Ua NEGATIVE NEGATIVE   RBC / HPF 0-5 0 - 5 RBC/hpf   WBC, UA 0-5 0  - 5 WBC/hpf   Bacteria, UA NONE SEEN NONE SEEN   Squamous Epithelial / LPF 0-5 0 - 5   Mucus PRESENT   I-Stat beta hCG blood, ED  Result Value Ref Range   I-stat hCG, quantitative <5.0 <5 mIU/mL   Comment 3            Ct Abdomen Pelvis W Contrast Result Date: 09/27/2018 CLINICAL DATA:  Lower abdominopelvic pain x2 weeks EXAM: CT ABDOMEN AND PELVIS WITH CONTRAST TECHNIQUE: Multidetector CT imaging of the abdomen and pelvis was performed using the standard protocol following bolus administration of intravenous contrast. CONTRAST:  151mL OMNIPAQUE IOHEXOL 300 MG/ML  SOLN COMPARISON:  Pelvic ultrasound dated 09/27/2018 FINDINGS: Lower chest: Lung bases are clear. Hepatobiliary: Liver is within normal limits. Gallbladder is unremarkable. No intrahepatic or extrahepatic ductal dilatation. Pancreas: Within normal limits. Spleen: Within normal limits. Adrenals/Urinary Tract: Adrenal glands are within normal limits. Kidneys are within normal limits.  No hydronephrosis. Bladder is within normal limits. Stomach/Bowel: Stomach is within normal limits. No evidence of bowel obstruction. Normal appendix (series 2/image 61). Vascular/Lymphatic: No evidence of abdominal aortic aneurysm. No suspicious abdominopelvic lymphadenopathy. Reproductive: 1.7 cm intramural fibroid in the left uterine body (series 2/image 56). Dilated endometrial cavity with fluid/hemorrhage when correlating with recent pelvic ultrasound, suggesting hematometros, most likely secondary to cervical stenosis. No obstructing mass is evident on CT. Bilateral ovaries are unremarkable. Other: No abdominopelvic ascites. Musculoskeletal:  Visualized osseous structures are within normal limits. IMPRESSION: Suspected hematometros, most likely secondary to cervical stenosis. No obstructing mass is evident on CT. Consider GYN consultation for direct visualization/evaluation. 1.7 cm intramural fibroid in the left uterine body. Electronically Signed   By: Julian Hy M.D.   On: 09/27/2018 13:30   US Pelvic Doppler (torsion R/o Or Mass Arterial Flow) Result Date: 09/27/2018 CLINICAL DATA:  Worsening right pelvic pain for 2 weeks. Previous cervical cone biopsy and LEEP procedure. EXAM: TRANSABDOMINAL AND TRANSVAGINAL ULTRASOUND OF PELVIS DOPPLER ULTRASOUND OF OVARIES TECHNIQUE: Both transabdominal and transvaginal ultrasound examinations of the pelvis were performed. Transabdominal technique was performed for global imaging of the pelvis including uterus, ovaries, adnexal regions, and pelvic cul-de-sac. It was necessary to proceed with endovaginal exam following the transabdominal exam to visualize the endometrium and ovaries. Color and duplex Doppler ultrasound was utilized to evaluate blood flow to the ovaries. COMPARISON:  None. FINDINGS: Uterus Measurements: 9.3 x 6.2 x 7 5 cm = volume: 227 mL. No fibroids or other mass visualized. Endometrium Thickness: Markedly distended endometrial cavity filled with complex fluid, consistent with hematometros. No obstructing mass identified lower uterine segment or cervix. Right ovary Measurements: 4.3 x 2.5 x 3.0 cm = volume: 17.2 mL. Normal appearance/no adnexal mass. Left ovary Measurements: 3.4 x 1.7 x 2.0 cm = volume: 6.0 mL. Normal appearance/no adnexal mass. Pulsed Doppler evaluation of both ovaries demonstrates normal low-resistance arterial and venous waveforms. Other findings No abnormal free fluid. 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 No obstructing mass identified. Normal appearance of both ovaries.  No adnexal mass identified. Electronically Signed   By: Earle Gell M.D.   On: 09/27/2018 12:35     Margaret Cameron was evaluated in Emergency Department on 09/27/2018 for the symptoms described in the history of present illness. She was evaluated in the context of the global COVID-19 pandemic, which necessitated consideration that the patient might be at risk for infection with the SARS-CoV-2  virus that causes COVID-19. Institutional protocols and algorithms that pertain to the evaluation of patients at risk for COVID-19 are in a state of rapid change based on information released by regulatory bodies including the CDC and federal and state organizations. These policies and algorithms were followed during the patient's care in the ED.    1400:  CT with f/u US as above. Pt continues uncomfortable despite meds. T/C returned from OB/GYN Dr. Kennon Rounds, case discussed, including:  HPI, pertinent PM/SHx, VS/PE, dx testing, ED course and treatment:  States to keep pt NPO and Dr. Roselie Awkward will come to ED to evaluate pt regarding need for bedside procedure vs OR. Dx and testing d/w pt.  Questions answered.  Verb understanding, agreeable to ED evaluation by GYN MD with possible treatment in OR.      Final Clinical Impressions(s) / ED Diagnoses   Final diagnoses:  Pelvic pain  Pelvic pain    ED Discharge Orders    None       Francine Graven, DO 09/29/18 2694

## 2018-09-27 NOTE — ED Notes (Signed)
Patient transported to CT 

## 2018-09-27 NOTE — Transfer of Care (Signed)
Immediate Anesthesia Transfer of Care Note  Patient: Designer, television/film set  Procedure(s) Performed: DILATATION AND CURETTAGE (N/A Cervix)  Patient Location: PACU  Anesthesia Type:General  Level of Consciousness: sedated and responds to stimulation  Airway & Oxygen Therapy: Patient Spontanous Breathing and Patient connected to face mask oxygen  Post-op Assessment: Report given to RN, Post -op Vital signs reviewed and stable and Patient moving all extremities  Post vital signs: Reviewed and stable  Last Vitals:  Vitals Value Taken Time  BP    Temp    Pulse 95 09/27/2018  5:41 PM  Resp 14 09/27/2018  5:41 PM  SpO2 100 % 09/27/2018  5:41 PM  Vitals shown include unvalidated device data.  Last Pain:  Vitals:   09/27/18 1630  TempSrc:   PainSc: 0-No pain         Complications: No apparent anesthesia complications

## 2018-09-27 NOTE — ED Triage Notes (Addendum)
Patient c/o lower abdominal cramping, and "feeling like my cervix is going to fall out." x 2 weeks. Patient denies any dysuria. Patient also c/o emesis and states she has problems with constipation.

## 2018-09-28 ENCOUNTER — Encounter (HOSPITAL_COMMUNITY): Payer: Self-pay | Admitting: Obstetrics & Gynecology

## 2018-09-28 NOTE — Anesthesia Postprocedure Evaluation (Signed)
Anesthesia Post Note  Patient: Designer, television/film set  Procedure(s) Performed: DILATATION AND CURETTAGE (N/A Cervix)     Patient location during evaluation: PACU Anesthesia Type: General Level of consciousness: awake and alert Pain management: pain level controlled Vital Signs Assessment: post-procedure vital signs reviewed and stable Respiratory status: spontaneous breathing, nonlabored ventilation, respiratory function stable and patient connected to nasal cannula oxygen Cardiovascular status: blood pressure returned to baseline and stable Postop Assessment: no apparent nausea or vomiting Anesthetic complications: no    Last Vitals:  Vitals:   09/27/18 1815 09/27/18 1818  BP: 116/72   Pulse: (!) 59   Resp: 11   Temp:  36.6 C  SpO2: 95%     Last Pain:  Vitals:   09/27/18 1815  TempSrc:   PainSc: 0-No pain   Pain Goal:                   Montez Hageman

## 2018-11-05 ENCOUNTER — Other Ambulatory Visit: Payer: Self-pay

## 2018-11-05 ENCOUNTER — Encounter (HOSPITAL_COMMUNITY): Payer: Self-pay

## 2018-11-05 ENCOUNTER — Emergency Department (HOSPITAL_COMMUNITY)
Admission: EM | Admit: 2018-11-05 | Discharge: 2018-11-05 | Disposition: A | Discharge status: Home / Self Care | Payer: No Typology Code available for payment source | Attending: Emergency Medicine | Admitting: Emergency Medicine

## 2018-11-05 DIAGNOSIS — R1032 Left lower quadrant pain: Secondary | ICD-10-CM | POA: Diagnosis present

## 2018-11-05 DIAGNOSIS — L0291 Cutaneous abscess, unspecified: Secondary | ICD-10-CM

## 2018-11-05 DIAGNOSIS — Z87891 Personal history of nicotine dependence: Secondary | ICD-10-CM | POA: Insufficient documentation

## 2018-11-05 DIAGNOSIS — L02214 Cutaneous abscess of groin: Secondary | ICD-10-CM | POA: Insufficient documentation

## 2018-11-05 DIAGNOSIS — Z79899 Other long term (current) drug therapy: Secondary | ICD-10-CM | POA: Diagnosis not present

## 2018-11-05 MED ORDER — CEPHALEXIN 500 MG PO CAPS: 500.0000 mg | ORAL_CAPSULE | Freq: Four times a day (QID) | ORAL | 0 refills | Status: DC

## 2018-11-05 MED ORDER — LIDOCAINE-EPINEPHRINE-TETRACAINE (LET) SOLUTION
3.0000 mL | Freq: Once | NASAL | Status: AC
  Administered 2018-11-05: 3 mL via TOPICAL
  Filled 2018-11-05: qty 3

## 2018-11-05 MED ORDER — LIDOCAINE HCL (PF) 1 % IJ SOLN
5.0000 mL | Freq: Once | INTRAMUSCULAR | Status: AC
  Administered 2018-11-05: 5 mL via INTRADERMAL
  Filled 2018-11-05: qty 30

## 2018-11-05 MED ORDER — HYDROCODONE-ACETAMINOPHEN 5-325 MG PO TABS: 1.0000 | ORAL_TABLET | ORAL | 0 refills | Status: DC | PRN

## 2018-11-05 NOTE — Discharge Instructions (Signed)
Take the prescribed medication as directed.  Recommend warm compresses/soaks a few times a day as you can tolerate. Follow-up with your primary care doctor. Return to the ED for new or worsening symptoms.

## 2018-11-05 NOTE — ED Provider Notes (Signed)
[facility] DEPT Provider Note   CSN: [id number] Arrival date & time: 11/05/18  3716     History   Chief Complaint Chief Complaint  Patient presents with  . Recurrent Skin Infections    HPI Margaret Cameron is a 38 y.o. female.     The history is provided by the patient and medical records.     38 year old female with history of anemia, migraine headaches, pilonidal cyst, sickle cell trait, presenting to the ED with abscess to left groin.  States she for started noticing this 3 days ago.  Reports history of same a while back and was given antibiotics with resolution.  States area this time has become increasingly more swollen and painful.  She denies any drainage.  No fever.  No history of diabetes, MRSA, or HIV.  Past Medical History:  Diagnosis Date  . Abnormal Pap smear of cervix    LEEP 2011  . Anemia    with pregnancy  . Chronic headaches   . Cyst near coccyx   . Hypoglycemia   . Migraine   . Pilonidal cyst   . Pyelonephritis    x 3 episodes  . Sickle cell trait [facility])     Patient Active Problem List   Diagnosis Date Noted  . Cervical stenosis (uterine cervix) 09/27/2018  . Hematometra 09/27/2018  . Severe dysplasia of cervix (CIN III) 08/31/2017  . Hx of pyelonephritis 12/09/2012    Past Surgical History:  Procedure Laterality Date  . CERVICAL CONE BIOPSY    . CERVICAL CONIZATION W/BX  2019  . DILATION AND CURETTAGE OF UTERUS  2008  . DILATION AND CURETTAGE OF UTERUS N/A 09/27/2018   Procedure: DILATATION AND CURETTAGE;  Surgeon: Woodroe Mode, MD;  Location: [facility] ORS;  Service: Gynecology;  Laterality: N/A;  . LEEP  2011  . OTHER SURGICAL HISTORY     Pynlodial cyst removed from tail bone 2012  . OTHER SURGICAL HISTORY  10/2010   removal of cyst near coxxyx  . PILONIDAL CYST EXCISION  10/2010     OB History    Gravida  5   Para  2   Term  2   Preterm  0   AB  3   Living  2     SAB  2   TAB  1   Ectopic  0    Multiple  0   Live Births  2            Home Medications    Prior to Admission medications   Medication Sig Start Date End Date Taking? Authorizing Provider  Black Currant Seed Oil 500 MG CAPS Take 500 mg by mouth daily.    [provider]  ibuprofen (ADVIL) 600 MG tablet Take 1 tablet (600 mg total) by mouth every 6 (six) hours as needed. 09/27/18   Woodroe Mode, MD  oxyCODONE-acetaminophen (PERCOCET/ROXICET) 5-325 MG tablet Take 1-2 tablets by mouth every 6 (six) hours as needed. 09/27/18   Woodroe Mode, MD    Family History Family History  Problem Relation Age of Onset  . Hypertension Brother   . Cancer Maternal Aunt        breast    Social History Social History   Tobacco Use  . Smoking status: Former Smoker    Packs/day: 0.10    Types: Cigarettes    Quit date: 07/14/2014    Years since quitting: 4.3  . Smokeless tobacco: Never Used  Substance Use  Topics  . Alcohol use: No  . Drug use: No     Allergies   Codeine and Septra [bactrim]   Review of Systems Review of Systems  Skin:       Abscess  All other systems reviewed and are negative.    Physical Exam Updated Vital Signs BP 122/77 (BP Location: Left Arm)   Pulse 96   Temp 97.7 F (36.5 C) (Oral)   Resp 20   Ht 5\' 6"  (1.676 m)   Wt 79.4 kg   SpO2 100%   BMI 28.25 kg/m   Physical Exam Vitals signs and nursing note reviewed.  Constitutional:      Appearance: She is well-developed.  HENT:     Head: Normocephalic and atraumatic.  Eyes:     Conjunctiva/sclera: Conjunctivae normal.     Pupils: Pupils are equal, round, and reactive to light.  Neck:     Musculoskeletal: Normal range of motion.  Cardiovascular:     Rate and Rhythm: Normal rate and regular rhythm.     Heart sounds: Normal heart sounds.  Pulmonary:     Effort: Pulmonary effort is normal.     Breath sounds: Normal breath sounds.  Abdominal:     General: Bowel sounds are normal.     Palpations: Abdomen is  soft.  Genitourinary:    Comments: Abscess noted to left groin, more so on the left medial thigh than genitals; area is approximately 2 cm in diameter, central fluctuance noted without drainage, about 1 inch of surrounding induration and erythema in all directions, no tissue crepitus Musculoskeletal: Normal range of motion.  Skin:    General: Skin is warm and dry.  Neurological:     Mental Status: She is alert and oriented to person, place, and time.      ED Treatments / Results  Labs (all labs ordered are listed, but only abnormal results are displayed) Labs Reviewed - No data to display  EKG    Radiology No results found.  Procedures Procedures (including critical care time)  INCISION AND DRAINAGE Performed by: Larene Pickett Consent: Verbal consent obtained. Risks and benefits: risks, benefits and alternatives were discussed Type: abscess  Body area: left groin  Anesthesia: local infiltration  Incision was made with a scalpel.  Local anesthetic: LET followed by lidocaine 1% without epinephrine  Anesthetic total: 3 ml  Complexity: complex Blunt dissection to break up loculations  Drainage: purulent  Drainage amount: moderate  Packing material: none  Patient tolerance: Patient tolerated the procedure well with no immediate complications.     Medications Ordered in ED Medications  lidocaine-EPINEPHrine-tetracaine (LET) solution (3 mLs Topical Given 11/05/18 0509)  lidocaine (PF) (XYLOCAINE) 1 % injection 5 mL (5 mLs Intradermal Given by Other 11/05/18 1540)     Initial Impression / Assessment and Plan / ED Course  I have reviewed the triage vital signs and the nursing notes.  Pertinent labs & imaging results that were available during my care of the patient were reviewed by me and considered in my medical decision making (see chart for details).  38 year old female here with abscess of left groin.  States she noticed this about 3 days ago but area has  become larger and increasingly more painful.  She is afebrile and nontoxic in appearance.  She has abscess to the left groin, more so on the left medial thigh and then genital region.  It is approximately 2 cm in diameter with central fluctuance, no active drainage.  She does  have about 1 inch of surrounding erythema and induration in all directions.  No tissue crepitus or areas of necrosis.  Not concerning for Fournier's or other gangrenous process.  I&D was performed, patient tolerated well.  Adequate drainage but area is small and not amenable to packing.  Given erythema and induration, will prescribe course of antibiotics as well as short course pain medication.  Encourage warm compresses/soaks at home.  Can follow-up with PCP.  Return here for any new or acute changes.  Final Clinical Impressions(s) / ED Diagnoses   Final diagnoses:  Abscess    ED Discharge Orders         Ordered     11/05/18 0606    HYDROcodone-acetaminophen (NORCO/VICODIN) 5-325 MG tablet  Every 4 hours PRN     11/05/18 0606    cephALEXin (KEFLEX) 500 MG capsule  4 times daily     11/05/18 0608           Larene Pickett, PA-C 11/05/18 8638    Long, [facility], MD 11/06/18 [phone]

## 2018-11-05 NOTE — ED Triage Notes (Signed)
Pt complains of a vaginal abscess for three days, she states that she's had them before

## 2019-02-02 ENCOUNTER — Other Ambulatory Visit: Payer: Self-pay

## 2019-02-02 ENCOUNTER — Encounter (HOSPITAL_COMMUNITY): Payer: Self-pay | Admitting: *Deleted

## 2019-02-02 ENCOUNTER — Emergency Department (HOSPITAL_COMMUNITY)
Admission: EM | Admit: 2019-02-02 | Discharge: 2019-02-02 | Disposition: A | Discharge status: Home / Self Care | Payer: No Typology Code available for payment source | Attending: Emergency Medicine | Admitting: Emergency Medicine

## 2019-02-02 DIAGNOSIS — L02411 Cutaneous abscess of right axilla: Secondary | ICD-10-CM | POA: Insufficient documentation

## 2019-02-02 DIAGNOSIS — Z87891 Personal history of nicotine dependence: Secondary | ICD-10-CM | POA: Insufficient documentation

## 2019-02-02 DIAGNOSIS — Z79899 Other long term (current) drug therapy: Secondary | ICD-10-CM | POA: Diagnosis not present

## 2019-02-02 DIAGNOSIS — M79621 Pain in right upper arm: Secondary | ICD-10-CM | POA: Diagnosis present

## 2019-02-02 MED ORDER — LIDOCAINE-EPINEPHRINE (PF) 2 %-1:200000 IJ SOLN
10.0000 mL | Freq: Once | INTRAMUSCULAR | Status: AC
  Administered 2019-02-02: 10 mL
  Filled 2019-02-02: qty 10

## 2019-02-02 NOTE — ED Triage Notes (Signed)
Pt arrives with c/o abscess to the right axilla for 1 week. No drainage. No fevers.

## 2019-02-02 NOTE — ED Provider Notes (Signed)
[facility] DEPT Provider Note   CSN: [id number] Arrival date & time: 02/02/19  0510     History   Chief Complaint Chief Complaint  Patient presents with  . Abscess    HPI Margaret Cameron is a 38 y.o. female.     The history is provided by the patient. No language interpreter was used.  Abscess Location:  Shoulder/arm Shoulder/arm abscess location:  R axilla Size:  4cm diameter Abscess quality: induration and painful   Red streaking: no   Duration:  1 week Progression:  Worsening Pain details:    Severity:  Moderate   Duration:  1 week   Timing:  Constant   Progression:  Worsening Chronicity:  Recurrent Relieved by:  Nothing Ineffective treatments: Tylenol and warm compresses. Associated symptoms: no fever   Risk factors: prior abscess     Past Medical History:  Diagnosis Date  . Abnormal Pap smear of cervix    LEEP 2011  . Anemia    with pregnancy  . Chronic headaches   . Cyst near coccyx   . Hypoglycemia   . Migraine   . Pilonidal cyst   . Pyelonephritis    x 3 episodes  . Sickle cell trait [facility])     Patient Active Problem List   Diagnosis Date Noted  . Cervical stenosis (uterine cervix) 09/27/2018  . Hematometra 09/27/2018  . Severe dysplasia of cervix (CIN III) 08/31/2017  . Hx of pyelonephritis 12/09/2012    Past Surgical History:  Procedure Laterality Date  . CERVICAL CONE BIOPSY    . CERVICAL CONIZATION W/BX  2019  . DILATION AND CURETTAGE OF UTERUS  2008  . DILATION AND CURETTAGE OF UTERUS N/A 09/27/2018   Procedure: DILATATION AND CURETTAGE;  Surgeon: Woodroe Mode, MD;  Location: [facility];  Service: Gynecology;  Laterality: N/A;  . LEEP  2011  . OTHER SURGICAL HISTORY     Pynlodial cyst removed from tail bone 2012  . OTHER SURGICAL HISTORY  10/2010   removal of cyst near coxxyx  . PILONIDAL CYST EXCISION  10/2010     OB History    Gravida  5   Para  2   Term  2   Preterm  0   AB  3   Living   2     SAB  2   TAB  1   Ectopic  0   Multiple  0   Live Births  2            Home Medications    Prior to Admission medications   Medication Sig Start Date End Date Taking? Authorizing Provider  Black Currant Seed Oil 500 MG CAPS Take 500 mg by mouth daily.    [provider]  cephALEXin (KEFLEX) 500 MG capsule Take 1 capsule (500 mg total) by mouth 4 (four) times daily. 11/05/18   Larene Pickett, PA-C  HYDROcodone-acetaminophen (NORCO/VICODIN) 5-325 MG tablet Take 1 tablet by mouth every 4 (four) hours as needed. 11/05/18   Larene Pickett, PA-C  ibuprofen (ADVIL) 600 MG tablet Take 1 tablet (600 mg total) by mouth every 6 (six) hours as needed. 09/27/18   Woodroe Mode, MD  oxyCODONE-acetaminophen (PERCOCET/ROXICET) 5-325 MG tablet Take 1-2 tablets by mouth every 6 (six) hours as needed. 09/27/18   Woodroe Mode, MD    Family History Family History  Problem Relation Age of Onset  . Hypertension Brother   . Cancer Maternal Aunt  breast    Social History Social History   Tobacco Use  . Smoking status: Former Smoker    Packs/day: 0.10    Types: Cigarettes    Quit date: 07/14/2014    Years since quitting: 4.5  . Smokeless tobacco: Never Used  Substance Use Topics  . Alcohol use: No  . Drug use: No     Allergies   Codeine and Septra [bactrim]   Review of Systems Review of Systems  Constitutional: Negative for fever.  Ten systems reviewed and are negative for acute change, except as noted in the HPI.    Physical Exam Updated Vital Signs BP 116/66 (BP Location: Left Arm)   Pulse 84   Temp 98 F (36.7 C) (Oral)   Resp 16   Ht 5\' 6"  (1.676 m)   Wt 79.4 kg   SpO2 100%   BMI 28.25 kg/m   Physical Exam Vitals signs and nursing note reviewed.  Constitutional:      General: She is not in acute distress.    Appearance: She is well-developed. She is not diaphoretic.     Comments: Nontoxic appearing and in NAD  HENT:     Head:  Normocephalic and atraumatic.  Eyes:     General: No scleral icterus.    Conjunctiva/sclera: Conjunctivae normal.  Neck:     Musculoskeletal: Normal range of motion.  Pulmonary:     Effort: Pulmonary effort is normal. No respiratory distress.     Comments: Respirations even and unlabored Musculoskeletal: Normal range of motion.  Skin:    General: Skin is warm and dry.     Coloration: Skin is not pale.     Findings: No erythema or rash.     Comments: Firm, tender, right axillary abscess.  Neurological:     Mental Status: She is alert and oriented to person, place, and time.  Psychiatric:        Behavior: Behavior normal.      ED Treatments / Results  Labs (all labs ordered are listed, but only abnormal results are displayed) Labs Reviewed - No data to display  EKG None  Radiology No results found.  Procedures Procedures (including critical care time)  INCISION AND DRAINAGE Performed by: Antonietta Breach Consent: Verbal consent obtained. Risks and benefits: risks, benefits and alternatives were discussed Type: abscess  Body area: R axilla  Anesthesia: local infiltration  Incision was made with a scalpel.  Local anesthetic: lidocaine 1% with epinephrine  Anesthetic total: 5 ml  Complexity: complex Blunt dissection to break up loculations  Drainage: purulent  Drainage amount: ~4cc  Packing material: none  Patient tolerance: Patient tolerated the procedure well with no immediate complications.     Medications Ordered in ED Medications  lidocaine-EPINEPHrine (XYLOCAINE W/EPI) 2 %-1:200000 (PF) injection 10 mL (has no administration in time range)     Initial Impression / Assessment and Plan / ED Course  I have reviewed the triage vital signs and the nursing notes.  Pertinent labs & imaging results that were available during my care of the patient were reviewed by me and considered in my medical decision making (see chart for details).         Patient with skin abscess amenable to incision and drainage.  Abscess was not large enough to warrant packing or drain, wound recheck in 2 days. Encouraged home warm soaks and flushing.  Mild signs of cellulitis is surrounding skin.  Will d/c to home.  No antibiotic therapy is indicated.  Return precautions  discussed and provided.  Patient discharged in stable condition with no unaddressed concerns.   Final Clinical Impressions(s) / ED Diagnoses   Final diagnoses:  Abscess of axilla, right    ED Discharge Orders    None       Antonietta Breach, PA-C 02/02/19 [place], MD 02/03/19 [phone]

## 2019-02-02 NOTE — Discharge Instructions (Signed)
Continue warm soaks or compresses 3-4 times per day to promote drainage.  Take Tylenol or ibuprofen for management of persistent pain.  Have the area rechecked by her primary care doctor in 48 hours.  You may return for new or concerning symptoms.

## 2019-08-04 ENCOUNTER — Other Ambulatory Visit: Payer: Self-pay

## 2019-08-04 ENCOUNTER — Encounter (HOSPITAL_COMMUNITY): Payer: Self-pay

## 2019-08-04 ENCOUNTER — Emergency Department (HOSPITAL_COMMUNITY)
Admission: EM | Admit: 2019-08-04 | Discharge: 2019-08-04 | Disposition: A | Discharge status: Home / Self Care | Payer: No Typology Code available for payment source | Attending: Emergency Medicine | Admitting: Emergency Medicine

## 2019-08-04 DIAGNOSIS — L02414 Cutaneous abscess of left upper limb: Secondary | ICD-10-CM | POA: Diagnosis not present

## 2019-08-04 DIAGNOSIS — Z79899 Other long term (current) drug therapy: Secondary | ICD-10-CM | POA: Insufficient documentation

## 2019-08-04 DIAGNOSIS — L02412 Cutaneous abscess of left axilla: Secondary | ICD-10-CM

## 2019-08-04 DIAGNOSIS — Z87891 Personal history of nicotine dependence: Secondary | ICD-10-CM | POA: Insufficient documentation

## 2019-08-04 DIAGNOSIS — R2232 Localized swelling, mass and lump, left upper limb: Secondary | ICD-10-CM | POA: Diagnosis present

## 2019-08-04 MED ORDER — LIDOCAINE HCL 2 % IJ SOLN
20.0000 mL | Freq: Once | INTRAMUSCULAR | Status: AC
  Administered 2019-08-04: 400 mg
  Filled 2019-08-04: qty 20

## 2019-08-04 MED ORDER — DOXYCYCLINE HYCLATE 100 MG PO CAPS: 100.0000 mg | ORAL_CAPSULE | Freq: Two times a day (BID) | ORAL | 0 refills | Status: DC

## 2019-08-04 NOTE — ED Provider Notes (Signed)
[facility] DEPT Provider Note   CSN: [id number] Arrival date & time: 08/04/19  [id number]     History Chief Complaint  Patient presents with  . Abscess    Margaret Cameron is a 39 y.o. female.  HPI Patient presents to the emergency department with an abscess to the left axilla.  Patient states that she has had these in the past and it started 4 days ago.  The patient states the area started to drain but she felt like it was not continuing and was getting fluid-filled.  Patient states that she did not take any medications prior to arrival for her symptoms.  Patient denies fever, nausea, vomiting, weakness, dizziness, near-syncope or syncope.    Past Medical History:  Diagnosis Date  . Abnormal Pap smear of cervix    LEEP 2011  . Anemia    with pregnancy  . Chronic headaches   . Cyst near coccyx   . Hypoglycemia   . Migraine   . Pilonidal cyst   . Pyelonephritis    x 3 episodes  . Sickle cell trait [facility])     Patient Active Problem List   Diagnosis Date Noted  . Cervical stenosis (uterine cervix) 09/27/2018  . Hematometra 09/27/2018  . Severe dysplasia of cervix (CIN III) 08/31/2017  . Hx of pyelonephritis 12/09/2012    Past Surgical History:  Procedure Laterality Date  . CERVICAL CONE BIOPSY    . CERVICAL CONIZATION W/BX  2019  . DILATION AND CURETTAGE OF UTERUS  2008  . DILATION AND CURETTAGE OF UTERUS N/A 09/27/2018   Procedure: DILATATION AND CURETTAGE;  Surgeon: Woodroe Mode, MD;  Location: [facility];  Service: Gynecology;  Laterality: N/A;  . LEEP  2011  . OTHER SURGICAL HISTORY     Pynlodial cyst removed from tail bone 2012  . OTHER SURGICAL HISTORY  10/2010   removal of cyst near coxxyx  . PILONIDAL CYST EXCISION  10/2010     OB History    Gravida  5   Para  2   Term  2   Preterm  0   AB  3   Living  2     SAB  2   TAB  1   Ectopic  0   Multiple  0   Live Births  2           Family History  Problem  Relation Age of Onset  . Hypertension Brother   . Cancer Maternal Aunt        breast    Social History   Tobacco Use  . Smoking status: Former Smoker    Packs/day: 0.10    Types: Cigarettes    Quit date: 07/14/2014    Years since quitting: 5.0  . Smokeless tobacco: Never Used  Substance Use Topics  . Alcohol use: No  . Drug use: No    Home Medications Prior to Admission medications   Medication Sig Start Date End Date Taking? Authorizing Provider  Black Currant Seed Oil 500 MG CAPS Take 500 mg by mouth daily.    [provider]  cephALEXin (KEFLEX) 500 MG capsule Take 1 capsule (500 mg total) by mouth 4 (four) times daily. 11/05/18   Larene Pickett, PA-C  HYDROcodone-acetaminophen (NORCO/VICODIN) 5-325 MG tablet Take 1 tablet by mouth every 4 (four) hours as needed. 11/05/18   Larene Pickett, PA-C  ibuprofen (ADVIL) 600 MG tablet Take 1 tablet (600 mg total) by mouth every 6 (  six) hours as needed. 09/27/18   Woodroe Mode, MD  oxyCODONE-acetaminophen (PERCOCET/ROXICET) 5-325 MG tablet Take 1-2 tablets by mouth every 6 (six) hours as needed. 09/27/18   Woodroe Mode, MD    Allergies    Codeine and Septra [bactrim]  Review of Systems   Review of Systems All other systems negative except as documented in the HPI. All pertinent positives and negatives as reviewed in the HPI. Physical Exam Updated Vital Signs BP 138/70 (BP Location: Right Arm)   Pulse 96   Temp 98.6 F (37 C) (Oral)   Resp 16   SpO2 98%   Physical Exam Vitals and nursing note reviewed.  Constitutional:      General: She is not in acute distress.    Appearance: She is well-developed.  HENT:     Head: Normocephalic and atraumatic.  Eyes:     Pupils: Pupils are equal, round, and reactive to light.  Pulmonary:     Effort: Pulmonary effort is normal.  Skin:    General: Skin is warm and dry.     Comments: Patient has a 3 cm x 4 cm area of fluctuance in the mid axillary line in the superior  aspect of the axillary region.  There is purulent drainage slowly oozing from the site.  Neurological:     Mental Status: She is alert and oriented to person, place, and time.     ED Results / Procedures / Treatments   Labs (all labs ordered are listed, but only abnormal results are displayed) Labs Reviewed - No data to display  EKG None  Radiology No results found.  Procedures Procedures (including critical care time)  Medications Ordered in ED Medications  lidocaine (XYLOCAINE) 2 % (with pres) injection 400 mg (has no administration in time range)    ED Course  I have reviewed the triage vital signs and the nursing notes.  Pertinent labs & imaging results that were available during my care of the patient were reviewed by me and considered in my medical decision making (see chart for details).    MDM Rules/Calculators/A&P                      INCISION AND DRAINAGE Performed by: Resa Miner Emmajean Ratledge Consent: Verbal consent obtained. Risks and benefits: risks, benefits and alternatives were discussed Type: abscess  Body area: Left axilla  Anesthesia: local infiltration  Incision was made with a scalpel.  Local anesthetic: lidocaine 2% without epinephrine  Anesthetic total: 5 ml  Complexity: complex Blunt dissection to break up loculations  Drainage: purulent  Drainage amount: Large  Packing material: No packing material utilized  Patient tolerance: Patient tolerated the procedure well with no immediate complications.  Patient is advised to keep the area clean and dry.  Have advised her to keep it covered as it would continue to drain.  I did advise her to use warm compresses around the area as well.  Told to return here for any worsening in her condition.  Final Clinical Impression(s) / ED Diagnoses Final diagnoses:  None    Rx / DC Orders ED Discharge Orders    None       Dalia Heading, PA-C 08/04/19 [id number]    Malvin Johns,  MD 08/04/19 0745

## 2019-08-04 NOTE — Discharge Instructions (Addendum)
Return here as needed.  Keep the area clean and dry.  I would keep the area covered as it will continue to drain.  You can use warm compresses and heat around the area to promote further drainage.

## 2019-08-04 NOTE — ED Triage Notes (Signed)
Pt reports an abscess in her L axilla. Hx of same.

## 2019-08-18 ENCOUNTER — Encounter (HOSPITAL_COMMUNITY): Payer: Self-pay

## 2019-08-18 ENCOUNTER — Other Ambulatory Visit: Payer: Self-pay

## 2019-08-18 ENCOUNTER — Inpatient Hospital Stay (HOSPITAL_COMMUNITY)
Admission: EM | Admit: 2019-08-18 | Discharge: 2019-08-21 | DRG: 391 | Disposition: A | Discharge status: Home / Self Care | Payer: No Typology Code available for payment source | Attending: Family Medicine | Admitting: Family Medicine

## 2019-08-18 ENCOUNTER — Emergency Department (HOSPITAL_COMMUNITY): Payer: No Typology Code available for payment source

## 2019-08-18 DIAGNOSIS — K921 Melena: Secondary | ICD-10-CM | POA: Diagnosis present

## 2019-08-18 DIAGNOSIS — Z87891 Personal history of nicotine dependence: Secondary | ICD-10-CM | POA: Diagnosis not present

## 2019-08-18 DIAGNOSIS — D573 Sickle-cell trait: Secondary | ICD-10-CM | POA: Diagnosis present

## 2019-08-18 DIAGNOSIS — Z809 Family history of malignant neoplasm, unspecified: Secondary | ICD-10-CM | POA: Diagnosis not present

## 2019-08-18 DIAGNOSIS — Z79899 Other long term (current) drug therapy: Secondary | ICD-10-CM

## 2019-08-18 DIAGNOSIS — A09 Infectious gastroenteritis and colitis, unspecified: Principal | ICD-10-CM | POA: Diagnosis present

## 2019-08-18 DIAGNOSIS — K5900 Constipation, unspecified: Secondary | ICD-10-CM | POA: Diagnosis not present

## 2019-08-18 DIAGNOSIS — Z20822 Contact with and (suspected) exposure to covid-19: Secondary | ICD-10-CM | POA: Diagnosis present

## 2019-08-18 DIAGNOSIS — G8929 Other chronic pain: Secondary | ICD-10-CM | POA: Diagnosis present

## 2019-08-18 DIAGNOSIS — Z8249 Family history of ischemic heart disease and other diseases of the circulatory system: Secondary | ICD-10-CM | POA: Diagnosis not present

## 2019-08-18 DIAGNOSIS — Z23 Encounter for immunization: Secondary | ICD-10-CM | POA: Diagnosis not present

## 2019-08-18 DIAGNOSIS — G43909 Migraine, unspecified, not intractable, without status migrainosus: Secondary | ICD-10-CM | POA: Diagnosis present

## 2019-08-18 DIAGNOSIS — K5731 Diverticulosis of large intestine without perforation or abscess with bleeding: Secondary | ICD-10-CM | POA: Diagnosis present

## 2019-08-18 DIAGNOSIS — Z881 Allergy status to other antibiotic agents status: Secondary | ICD-10-CM | POA: Diagnosis not present

## 2019-08-18 DIAGNOSIS — K529 Noninfective gastroenteritis and colitis, unspecified: Secondary | ICD-10-CM

## 2019-08-18 DIAGNOSIS — N12 Tubulo-interstitial nephritis, not specified as acute or chronic: Secondary | ICD-10-CM | POA: Diagnosis present

## 2019-08-18 DIAGNOSIS — M549 Dorsalgia, unspecified: Secondary | ICD-10-CM | POA: Diagnosis present

## 2019-08-18 DIAGNOSIS — Z885 Allergy status to narcotic agent status: Secondary | ICD-10-CM

## 2019-08-18 DIAGNOSIS — R1110 Vomiting, unspecified: Secondary | ICD-10-CM

## 2019-08-18 DIAGNOSIS — R109 Unspecified abdominal pain: Secondary | ICD-10-CM

## 2019-08-18 LAB — COMPREHENSIVE METABOLIC PANEL
ALT: 16 U/L (ref 0–44)
AST: 22 U/L (ref 15–41)
Albumin: 4.8 g/dL (ref 3.5–5.0)
Alkaline Phosphatase: 54 U/L (ref 38–126)
Anion gap: 14 (ref 5–15)
BUN: 13 mg/dL (ref 6–20)
CO2: 23 mmol/L (ref 22–32)
Calcium: 10.1 mg/dL (ref 8.9–10.3)
Chloride: 105 mmol/L (ref 98–111)
Creatinine, Ser: 0.74 mg/dL (ref 0.44–1.00)
GFR calc Af Amer: >60 mL/min (ref >60)
GFR calc non Af Amer: >60 mL/min (ref >60)
Glucose, Bld: 109 mg/dL — ABNORMAL HIGH (ref 70–99)
Potassium: 4.1 mmol/L (ref 3.5–5.1)
Sodium: 142 mmol/L (ref 135–145)
Total Bilirubin: 0.6 mg/dL (ref 0.3–1.2)
Total Protein: 8.4 g/dL — ABNORMAL HIGH (ref 6.5–8.1)

## 2019-08-18 LAB — CBC
HCT: 39 % (ref 36.0–46.0)
Hemoglobin: 13.2 g/dL (ref 12.0–15.0)
MCH: 30.8 pg (ref 26.0–34.0)
MCHC: 33.8 g/dL (ref 30.0–36.0)
MCV: 91.1 fL (ref 80.0–100.0)
Platelets: 327 10*3/uL (ref 150–400)
RBC: 4.28 MIL/uL (ref 3.87–5.11)
RDW: 13.6 % (ref 11.5–15.5)
WBC: 11.5 10*3/uL — ABNORMAL HIGH (ref 4.0–10.5)
nRBC: 0 % (ref 0.0–0.2)

## 2019-08-18 LAB — C DIFFICILE QUICK SCREEN W PCR REFLEX
C Diff antigen: NEGATIVE
C Diff toxin: POSITIVE — AB

## 2019-08-18 LAB — I-STAT BETA HCG BLOOD, ED (MC, WL, AP ONLY): I-stat hCG, quantitative: <5 m[IU]/mL (ref <5)

## 2019-08-18 LAB — PROTIME-INR
INR: 1.1 (ref 0.8–1.2)
Prothrombin Time: 13.8 seconds (ref 11.4–15.2)

## 2019-08-18 LAB — APTT: aPTT: 29 seconds (ref 24–36)

## 2019-08-18 LAB — POC OCCULT BLOOD, ED: Fecal Occult Bld: POSITIVE — AB

## 2019-08-18 MED ORDER — DIPHENHYDRAMINE HCL 50 MG/ML IJ SOLN: 25.0000 mg | Freq: Four times a day (QID) | INTRAMUSCULAR | Status: DC | PRN

## 2019-08-18 MED ORDER — SODIUM CHLORIDE 0.9 % IV SOLN: INTRAVENOUS | Status: DC

## 2019-08-18 MED ORDER — ONDANSETRON HCL 4 MG/2ML IJ SOLN
4.0000 mg | Freq: Once | INTRAMUSCULAR | Status: AC
  Administered 2019-08-18: 20:00:00 4 mg via INTRAVENOUS
  Filled 2019-08-18: qty 2

## 2019-08-18 MED ORDER — METRONIDAZOLE IN NACL 5-0.79 MG/ML-% IV SOLN
500.0000 mg | Freq: Three times a day (TID) | INTRAVENOUS | Status: DC
  Administered 2019-08-19 – 2019-08-21 (×8): 500 mg via INTRAVENOUS
  Filled 2019-08-18 (×8): qty 100

## 2019-08-18 MED ORDER — FENTANYL CITRATE (PF) 100 MCG/2ML IJ SOLN
50.0000 ug | Freq: Once | INTRAMUSCULAR | Status: AC
  Administered 2019-08-18: 50 ug via INTRAVENOUS
  Filled 2019-08-18: qty 2

## 2019-08-18 MED ORDER — IOHEXOL 300 MG/ML  SOLN
100.0000 mL | Freq: Once | INTRAMUSCULAR | Status: AC | PRN
  Administered 2019-08-18: 100 mL via INTRAVENOUS

## 2019-08-18 MED ORDER — ACETAMINOPHEN 650 MG RE SUPP: 650.0000 mg | Freq: Four times a day (QID) | RECTAL | Status: DC | PRN

## 2019-08-18 MED ORDER — CIPROFLOXACIN IN D5W 400 MG/200ML IV SOLN
400.0000 mg | Freq: Two times a day (BID) | INTRAVENOUS | Status: DC
  Administered 2019-08-18 – 2019-08-21 (×6): 400 mg via INTRAVENOUS
  Filled 2019-08-18 (×6): qty 200

## 2019-08-18 MED ORDER — SODIUM CHLORIDE 0.9 % IV BOLUS
1000.0000 mL | Freq: Once | INTRAVENOUS | Status: AC
  Administered 2019-08-18: 20:00:00 1000 mL via INTRAVENOUS

## 2019-08-18 MED ORDER — ONDANSETRON HCL 4 MG/2ML IJ SOLN: 4.0000 mg | Freq: Four times a day (QID) | INTRAMUSCULAR | Status: DC | PRN

## 2019-08-18 MED ORDER — MORPHINE SULFATE (PF) 2 MG/ML IV SOLN
1.0000 mg | INTRAVENOUS | Status: DC | PRN
  Administered 2019-08-19 – 2019-08-20 (×4): 1 mg via INTRAVENOUS
  Filled 2019-08-18 (×4): qty 1

## 2019-08-18 MED ORDER — SODIUM CHLORIDE (PF) 0.9 % IJ SOLN
INTRAMUSCULAR | Status: AC
  Filled 2019-08-18: qty 50

## 2019-08-18 MED ORDER — ACETAMINOPHEN 325 MG PO TABS: 650.0000 mg | ORAL_TABLET | Freq: Four times a day (QID) | ORAL | Status: DC | PRN

## 2019-08-18 MED ORDER — DIPHENHYDRAMINE HCL 50 MG/ML IJ SOLN: 12.5000 mg | Freq: Four times a day (QID) | INTRAMUSCULAR | Status: DC | PRN

## 2019-08-18 NOTE — ED Provider Notes (Signed)
[facility] Emergency Department Provider Note MRN:  [id number]  Arrival date & time: 08/18/19     Chief Complaint   Abdominal Pain   History of Present Illness   Margaret Cameron is a 39 y.o. year-old female with a history of sickle cell trait presenting to the ED with chief complaint of abdominal pain.  Lower abdominal cramping and persistent diarrhea today that was initially mildly bloody but has become pure blood with blood clots.  Passing pure blood every 20 minutes for multiple hours today.  Denies upper abdominal pain, no fever, no chest pain or shortness of breath, no recent antibiotics, no recent travel, no sick contacts.  Review of Systems  A complete 10 system review of systems was obtained and all systems are negative except as noted in the HPI and PMH.   Patient's Health History    Past Medical History:  Diagnosis Date  . Abnormal Pap smear of cervix    LEEP 2011  . Anemia    with pregnancy  . Chronic headaches   . Cyst near coccyx   . Hypoglycemia   . Migraine   . Pilonidal cyst   . Pyelonephritis    x 3 episodes  . Sickle cell trait [facility])     Past Surgical History:  Procedure Laterality Date  . CERVICAL CONE BIOPSY    . CERVICAL CONIZATION W/BX  2019  . DILATION AND CURETTAGE OF UTERUS  2008  . DILATION AND CURETTAGE OF UTERUS N/A 09/27/2018   Procedure: DILATATION AND CURETTAGE;  Surgeon: Woodroe Mode, MD;  Location: [facility];  Service: Gynecology;  Laterality: N/A;  . LEEP  2011  . OTHER SURGICAL HISTORY     Pynlodial cyst removed from tail bone 2012  . OTHER SURGICAL HISTORY  10/2010   removal of cyst near coxxyx  . PILONIDAL CYST EXCISION  10/2010    Family History  Problem Relation Age of Onset  . Hypertension Brother   . Cancer Maternal Aunt        breast    Social History   Socioeconomic History  . Marital status: Married    Spouse name: Not on file  . Number of children: Not on file  . Years of  education: Not on file  . Highest education level: Not on file  Occupational History  . Not on file  Tobacco Use  . Smoking status: Former Smoker    Packs/day: 0.10    Types: Cigarettes    Quit date: 07/14/2014    Years since quitting: 5.0  . Smokeless tobacco: Never Used  Substance and Sexual Activity  . Alcohol use: No  . Drug use: No  . Sexual activity: Not Currently    Birth control/protection: Inserts  Other Topics Concern  . Not on file  Social History Narrative  . Not on file   Social Determinants of Health   Financial Resource Strain:   . Difficulty of Paying Living Expenses:   Food Insecurity:   . Worried About Charity fundraiser in the Last Year:   . Arboriculturist in the Last Year:   Transportation Needs:   . Film/video editor (Medical):   Marland Kitchen Lack of Transportation (Non-Medical):   Physical Activity:   . Days of Exercise per Week:   . Minutes of Exercise per Session:   Stress:   . Feeling of Stress :   Social Connections:   . Frequency of Communication with Friends and Family:   .  Frequency of Social Gatherings with Friends and Family:   . Attends Religious Services:   . Active Member of Clubs or Organizations:   . Attends Archivist Meetings:   Marland Kitchen Marital Status:   Intimate Partner Violence:   . Fear of Current or Ex-Partner:   . Emotionally Abused:   Marland Kitchen Physically Abused:   . Sexually Abused:      Physical Exam   Vitals:   08/18/19 1717 08/18/19 2005  BP: 134/69 103/63  Pulse: 85 67  Resp: 18 18  Temp: 98.5 F (36.9 C)   SpO2: 100% 100%    CONSTITUTIONAL: Well-appearing, NAD NEURO:  Alert and oriented x 3, no focal deficits EYES:  eyes equal and reactive ENT/NECK:  no LAD, no JVD CARDIO: Regular rate, well-perfused, normal S1 and S2 PULM:  CTAB no wheezing or rhonchi GI/GU:  normal bowel sounds, non-distended, non-tender MSK/SPINE:  No gross deformities, no edema SKIN:  no rash, atraumatic PSYCH:  Appropriate speech and  behavior  *Additional and/or pertinent findings included in MDM below  Diagnostic and Interventional Summary    EKG Interpretation  Date/Time:    Ventricular Rate:    PR Interval:    QRS Duration:   QT Interval:    QTC Calculation:   R Axis:     Text Interpretation:        Labs Reviewed  COMPREHENSIVE METABOLIC PANEL - Abnormal; Notable for the following components:      Result Value   Glucose, Bld 109 (*)    Total Protein 8.4 (*)    All other components within normal limits  CBC - Abnormal; Notable for the following components:   WBC 11.5 (*)    All other components within normal limits  POC OCCULT BLOOD, ED - Abnormal; Notable for the following components:   Fecal Occult Bld POSITIVE (*)    All other components within normal limits  SARS CORONAVIRUS 2 (TAT 6-24 HRS)  APTT  PROTIME-INR  I-STAT BETA HCG BLOOD, ED ([facility], [facility], [facility] ONLY)  TYPE AND SCREEN    CT ABDOMEN PELVIS W CONTRAST  Final Result      Medications  sodium chloride (PF) 0.9 % injection (has no administration in time range)  sodium chloride 0.9 % bolus 1,000 mL (1,000 mLs Intravenous New Bag/Given 08/18/19 2003)  ondansetron (ZOFRAN) injection 4 mg (4 mg Intravenous Given 08/18/19 2002)  fentaNYL (SUBLIMAZE) injection 50 mcg (50 mcg Intravenous Given 08/18/19 2053)  iohexol (OMNIPAQUE) 300 MG/ML solution 100 mL (100 mLs Intravenous Contrast Given 08/18/19 2039)     Procedures  /  Critical Care LOWER ENDOSCOPY  Date/Time: 08/18/2019 10:00 PM Performed by: Maudie Flakes, MD Authorized by: Maudie Flakes, MD  Consent: Verbal consent obtained. Risks and benefits: risks, benefits and alternatives were discussed Consent given by: patient Patient identity confirmed: verbally with patient Indications: rectal bleeding Scope type: anoscope Procedure termination: procedure complete Patient tolerance: patient tolerated the procedure well with no immediate complications      ED Course and Medical  Decision Making  I have reviewed the triage vital signs, the nursing notes, and pertinent available records from the EMR.  Pertinent labs & imaging results that were available during my care of the patient were reviewed by me and considered in my medical decision making (see below for details).     Question of infectious diarrhea with bleeding versus bleeding due to diverticulosis.  CT reveals possible colitis and also demonstrates diverticular disease.  No obvious hemorrhoids viewed  during anoscopy.  Patient continues to be very uncomfortable, fecal occult positive, reassuring vital signs and H&H, will request observation admission.    Barth Kirks. Sedonia Small, MD [place] [email or web address]  Final Clinical Impressions(s) / ED Diagnoses     ICD-10-CM   1. Hematochezia  K92.1     ED Discharge Orders    None       Discharge Instructions Discussed with and Provided to Patient:   Discharge Instructions   None       Maudie Flakes, MD 08/18/19 2202

## 2019-08-18 NOTE — ED Triage Notes (Signed)
Patient reports abdominal pain and rectal bleeding. Patient is bent over in pain and reports constanct lower abdominal pain. Patient reports she has been passing large clots since this morning. Patient is tearful in triage

## 2019-08-18 NOTE — H&P (Signed)
History and Physical    Margaret Cameron [id number] DOB: 1981/03/09 DOA: 08/18/2019  PCP: [facility] Patient coming from: Home  Chief Complaint: Abdominal pain, rectal bleeding  HPI: Margaret Cameron is a 39 y.o. female with medical history significant of anemia, sickle cell trait, migraine headaches, pyelonephritis presenting to the ED with complaints of abdominal pain and rectal bleeding.  Patient states since 2 AM last night she has had cramping lower abdominal pain.  She initially had diarrhea but now just noticing bright red blood per rectum when she uses the toilet.  She has had 6 episodes of rectal bleeding.  Also reports a few episodes of nonbloody emesis.  She is having chills.  No reported fevers.  Denies any recent sick contacts.  No one else at home is experiencing similar symptoms.  Denies recent travel or trying any new foods.  States she was recently treated with a course of doxycycline for a cyst in her armpit.  Denies cough, shortness of breath, or chest pain.  ED Course: Afebrile.  Hemodynamically stable.  WBC 11.5, has mild chronic leukocytosis on prior labs.  Hemoglobin 13.2.  Beta-hCG negative.  LFTs normal.  INR 1.1. CT abdomen pelvis showing questionable mild wall thickening of the descending colon/proximal sigmoid colon which could be seen in the setting of infectious or inflammatory colitis.  Scattered colonic diverticula without evidence of diverticulitis.  Patient received Zofran, fentanyl, and 1 L normal saline bolus.  Review of Systems:  All systems reviewed and apart from history of presenting illness, are negative.  Past Medical History:  Diagnosis Date  . Abnormal Pap smear of cervix    LEEP 2011  . Anemia    with pregnancy  . Chronic headaches   . Cyst near coccyx   . Hypoglycemia   . Migraine   . Pilonidal cyst   . Pyelonephritis    x 3 episodes  . Sickle cell trait [facility])     Past Surgical History:  Procedure  Laterality Date  . CERVICAL CONE BIOPSY    . CERVICAL CONIZATION W/BX  2019  . DILATION AND CURETTAGE OF UTERUS  2008  . DILATION AND CURETTAGE OF UTERUS N/A 09/27/2018   Procedure: DILATATION AND CURETTAGE;  Surgeon: Woodroe Mode, MD;  Location: [facility];  Service: Gynecology;  Laterality: N/A;  . LEEP  2011  . OTHER SURGICAL HISTORY     Pynlodial cyst removed from tail bone 2012  . OTHER SURGICAL HISTORY  10/2010   removal of cyst near coxxyx  . PILONIDAL CYST EXCISION  10/2010     reports that she quit smoking about 5 years ago. Her smoking use included cigarettes. She smoked 0.10 packs per day. She has never used smokeless tobacco. She reports that she does not drink alcohol or use drugs.  Allergies  Allergen Reactions  . Codeine Rash  . Septra [Bactrim] Rash    Family History  Problem Relation Age of Onset  . Hypertension Brother   . Cancer Maternal Aunt        breast    Prior to Admission medications   Medication Sig Start Date End Date Taking? Authorizing Provider  doxycycline (VIBRAMYCIN) 100 MG capsule Take 1 capsule (100 mg total) by mouth 2 (two) times daily. 08/04/19  Yes Lawyer, Harrell Gave, PA-C  etonogestrel (NEXPLANON) 68 MG IMPL implant 1 each by Subdermal route once.   Yes [provider]  oxyCODONE-acetaminophen (PERCOCET/ROXICET) 5-325 MG tablet Take 1-2 tablets by mouth every 6 (six)  hours as needed. Patient taking differently: Take 1-2 tablets by mouth every 6 (six) hours as needed for moderate pain or severe pain.  09/27/18  Yes Woodroe Mode, MD  cephALEXin (KEFLEX) 500 MG capsule Take 1 capsule (500 mg total) by mouth 4 (four) times daily. Patient not taking: Reported on 08/18/2019 11/05/18   Larene Pickett, PA-C  HYDROcodone-acetaminophen (NORCO/VICODIN) 5-325 MG tablet Take 1 tablet by mouth every 4 (four) hours as needed. Patient not taking: Reported on 08/18/2019 11/05/18   Larene Pickett, PA-C  ibuprofen (ADVIL) 600 MG tablet Take 1 tablet (600  mg total) by mouth every 6 (six) hours as needed. Patient not taking: Reported on 08/18/2019 09/27/18   Woodroe Mode, MD    Physical Exam: Vitals:   08/18/19 2130 08/18/19 2145 08/18/19 2200 08/18/19 2215  BP: (!) 115/54 (!) 119/52 (!) 110/57 113/64  Pulse: 64 71 78 88  Resp:      Temp:      TempSrc:      SpO2: 100% 100% 100% 100%    Physical Exam  Constitutional: She is oriented to person, place, and time. She appears well-developed and well-nourished. No distress.  Slightly diaphoretic  HENT:  Head: Normocephalic.  Eyes: Right eye exhibits no discharge. Left eye exhibits no discharge.  Cardiovascular: Normal rate, regular rhythm and intact distal pulses.  Pulmonary/Chest: Effort normal and breath sounds normal. No respiratory distress. She has no wheezes. She has no rales.  Abdominal: Soft. Bowel sounds are normal. She exhibits no distension. There is no abdominal tenderness. There is no rebound and no guarding.  Musculoskeletal:        General: No edema.     Cervical back: Neck supple.  Neurological: She is alert and oriented to person, place, and time.  Skin: Skin is warm and dry.     Labs on Admission: I have personally reviewed following labs and imaging studies  CBC: Recent Labs  Lab 08/18/19 1738  WBC 11.5*  HGB 13.2  HCT 39.0  MCV 91.1  PLT [id number]   Basic Metabolic Panel: Recent Labs  Lab 08/18/19 1738  NA 142  K 4.1  CL 105  CO2 23  GLUCOSE 109*  BUN 13  CREATININE 0.74  CALCIUM 10.1   GFR: CrCl cannot be calculated (Unknown ideal weight.). Liver Function Tests: Recent Labs  Lab 08/18/19 1738  AST 22  ALT 16  ALKPHOS 54  BILITOT 0.6  PROT 8.4*  ALBUMIN 4.8   No results for input(s): LIPASE, AMYLASE in the last 168 hours. No results for input(s): AMMONIA in the last 168 hours. Coagulation Profile: Recent Labs  Lab 08/18/19 1835  INR 1.1   Cardiac Enzymes: No results for input(s): CKTOTAL, CKMB, CKMBINDEX, TROPONINI in the last 168  hours. BNP (last 3 results) No results for input(s): PROBNP in the last 8760 hours. HbA1C: No results for input(s): HGBA1C in the last 72 hours. CBG: No results for input(s): GLUCAP in the last 168 hours. Lipid Profile: No results for input(s): CHOL, HDL, LDLCALC, TRIG, CHOLHDL, LDLDIRECT in the last 72 hours. Thyroid Function Tests: No results for input(s): TSH, T4TOTAL, FREET4, T3FREE, THYROIDAB in the last 72 hours. Anemia Panel: No results for input(s): VITAMINB12, FOLATE, FERRITIN, TIBC, IRON, RETICCTPCT in the last 72 hours. Urine analysis:    Component Value Date/Time   COLORURINE YELLOW 09/27/2018 1055   APPEARANCEUR CLEAR 09/27/2018 1055   LABSPEC 1.014 09/27/2018 1055   PHURINE 5.0 09/27/2018 1055   GLUCOSEU NEGATIVE  09/27/2018 1055   HGBUR MODERATE (A) 09/27/2018 [place] 09/27/2018 1055   KETONESUR NEGATIVE 09/27/2018 1055   PROTEINUR NEGATIVE 09/27/2018 1055   UROBILINOGEN 0.2 10/30/2014 0920   NITRITE NEGATIVE 09/27/2018 1055   LEUKOCYTESUR NEGATIVE 09/27/2018 1055    Radiological Exams on Admission: CT ABDOMEN PELVIS W CONTRAST  Result Date: 08/18/2019 CLINICAL DATA:  Pain. Lower GI bleed. EXAM: CT ABDOMEN AND PELVIS WITH CONTRAST TECHNIQUE: Multidetector CT imaging of the abdomen and pelvis was performed using the standard protocol following bolus administration of intravenous contrast. CONTRAST:  119mL OMNIPAQUE IOHEXOL 300 MG/ML  SOLN COMPARISON:  Sep 27, 2018 FINDINGS: Lower chest: The lung bases are clear. The heart size is normal. Hepatobiliary: The liver is normal. Normal gallbladder.There is no biliary ductal dilation. Pancreas: Normal contours without ductal dilatation. No peripancreatic fluid collection. Spleen: No splenic laceration or hematoma. Adrenals/Urinary Tract: --Adrenal glands: No adrenal hemorrhage. --Right kidney/ureter: No hydronephrosis or perinephric hematoma. --Left kidney/ureter: No hydronephrosis or perinephric hematoma.  --Urinary bladder: Unremarkable. Stomach/Bowel: --Stomach/Duodenum: No hiatal hernia or other gastric abnormality. Normal duodenal course and caliber. --Small bowel: No dilatation or inflammation. --Colon: There is questionable mild wall thickening of the descending colon, proximal sigmoid colon. There are few scattered colonic diverticula without CT evidence for diverticulitis. --Appendix: Normal. Vascular/Lymphatic: Multi are mild wall hila palpable not Earl into is tied this again --No retroperitoneal lymphadenopathy. --No mesenteric lymphadenopathy. --No pelvic or inguinal lymphadenopathy. Reproductive: Multiple fibroids are suspected. There is a simple appearing cystic structure involving the right ovary measuring approximately 2.2 cm. No further follow-up is required. Other: No ascites or free air. The abdominal wall is normal. Musculoskeletal. No acute displaced fractures. IMPRESSION: 1. Questionable mild wall thickening of the descending colon/proximal sigmoid colon could be seen in the setting of infectious or inflammatory colitis. 2. Scattered colonic diverticula without evidence of diverticulitis. 3. Normal appendix. 4. Fibroid uterus. Electronically Signed   By: Constance Holster M.D.   On: 08/18/2019 20:55    Assessment/Plan Principal Problem:   Colitis Active Problems:   Abdominal pain   Emesis   Hematochezia   Abdominal pain, nonbloody emesis, and hematochezia - suspect secondary to infectious or inflammatory colitis: Afebrile.  Hemodynamically stable.  Hemoglobin normal.  Has mild leukocytosis, stable compared to prior labs. CT abdomen pelvis showing questionable mild wall thickening of the descending colon/proximal sigmoid colon which could be seen in the setting of infectious or inflammatory colitis.  Also showing scattered colonic diverticula.  ?Diverticular bleeding. Plan: Start Cipro and Flagyl.  IV fluid hydration, morphine as needed for pain, and antiemetic as needed.  Type and  screen, monitor H&H every 4 hours.  Transfuse if hemoglobin <8.  Order GI pathogen panel, C. difficile PCR, and enteric precautions.  Check lactic acid level.  Consult GI in a.m.  HIV screening: The patient falls between the ages of 13-64 and should be screened for HIV, therefore HIV testing ordered.  DVT prophylaxis: SCDs Code Status: Full code Family Communication: No family available at this time. Disposition Plan: Anticipate discharge after resolution of abdominal pain and bloody diarrhea. Consults called: None Admission status: It is my clinical opinion that admission to INPATIENT is reasonable and necessary because of the expectation that this patient will require hospital care that crosses at least 2 midnights to treat this condition based on the medical complexity of the problems presented.  Given the aforementioned information, the predictability of an adverse outcome is felt to be significant.  The medical decision making  on this patient was of high complexity and the patient is at high risk for clinical deterioration, therefore this is a level 3 visit.  Shela Leff MD [facility]  If 7PM-7AM, please contact night-coverage [email or web address]  08/18/2019, 10:33 PM

## 2019-08-19 DIAGNOSIS — K921 Melena: Secondary | ICD-10-CM

## 2019-08-19 DIAGNOSIS — K529 Noninfective gastroenteritis and colitis, unspecified: Secondary | ICD-10-CM

## 2019-08-19 LAB — HEMOGLOBIN AND HEMATOCRIT, BLOOD
HCT: 32.5 % — ABNORMAL LOW (ref 36.0–46.0)
HCT: 31.9 % — ABNORMAL LOW (ref 36.0–46.0)
HCT: 31.5 % — ABNORMAL LOW (ref 36.0–46.0)
Hemoglobin: 11.2 g/dL — ABNORMAL LOW (ref 12.0–15.0)
Hemoglobin: 10.9 g/dL — ABNORMAL LOW (ref 12.0–15.0)
Hemoglobin: 10.8 g/dL — ABNORMAL LOW (ref 12.0–15.0)

## 2019-08-19 LAB — HIV ANTIBODY (ROUTINE TESTING W REFLEX): HIV Screen 4th Generation wRfx: NONREACTIVE

## 2019-08-19 LAB — TYPE AND SCREEN
ABO/RH(D): O POS
Antibody Screen: NEGATIVE

## 2019-08-19 LAB — LACTIC ACID, PLASMA: Lactic Acid, Venous: 1.2 mmol/L (ref 0.5–1.9)

## 2019-08-19 LAB — SARS CORONAVIRUS 2 (TAT 6-24 HRS): SARS Coronavirus 2: NEGATIVE

## 2019-08-19 LAB — CLOSTRIDIUM DIFFICILE BY PCR, REFLEXED: Toxigenic C. Difficile by PCR: NEGATIVE

## 2019-08-19 MED ORDER — INFLUENZA VAC SPLIT QUAD 0.5 ML IM SUSY
0.5000 mL | PREFILLED_SYRINGE | INTRAMUSCULAR | Status: AC
  Administered 2019-08-20: 11:00:00 0.5 mL via INTRAMUSCULAR

## 2019-08-19 MED ORDER — SODIUM CHLORIDE 0.9 % IV SOLN: INTRAVENOUS | Status: DC

## 2019-08-19 MED ORDER — VANCOMYCIN 50 MG/ML ORAL SOLUTION: 125.0000 mg | Freq: Four times a day (QID) | ORAL | Status: DC

## 2019-08-19 NOTE — Consult Note (Addendum)
Referring Provider:  [facility]         Primary Care Physician:  [facility] Primary Gastroenterologist:  Althia Forts           We were asked to see this patient for:   Rectal bleeding and colitis on CT scan               ASSESSMENT /  PLAN    # Crampy diarrhea with blood. Suspect infectious etiology --Possibe mild wall thickening of descending / sigmoid colon on CTscan.  --GI pathogen panel pending.  --C-diff studies indeterminate. C-diff not usually associated with bleeding however bleeding could be anorectal in nature as it started after several episodes of diarrhea and low volume it seems.  --Await GI pathogen panel, supportive care in the interim.   #  Mild [place] anemia. Hgb 13 ( baseline) >>> 10.8 --monitor for now. Sounds like volume of bleeding is small per patient    [facility] Attending   I have taken an interval history, reviewed the chart and examined the patient. I agree with the Advanced Practitioner's note, impression and recommendations.   Working dx is infection - note C diff PCR is neg so it is not a toxigenic C diff issue  Nonocclusive ischemic colitis also possible here  She reports slight improvement  She also says Hgb slightly low at baseline with [place] Trait  Agree w/ current care  Gatha Mayer, MD, [facility] 08/19/2019 3:02 PM     HPI:    Chief Complaint: abdominal pain and blood in stool  Margaret Cameron is a 39 y.o. female with PMH as listed below who presented to ED yesterday with abdominal cramping an bloody diarrhea. . WBC 11.5, she has mild chronic leukocytosis. Baseline hgb and presenting at baseline but down to 10.8 ( with IV fluids) 12-13, CT scan suggests questions mild wall thickening of descending / proximal sigmoid colon. Started on Cipro / flagyl / IV hydration.   Around 2 AM Saturday morning Candace developed crampy diarrhea.  She had multiple episodes with associated urgency/tenesmus.   Initially stools nonbloody when she began passing mainly just mild without stool.  She describes a volume of blood as being small but containing mucus.  She has associated nausea and vomiting.  No fevers. Recently took a course of Doxycycline for an axillary cyst, no other recent antibiotics.  Prior to the onset of symptoms patient felt fine.  She has no chronic GI problems.  Specifically, she has no chronic abdominal pain, problems with bowel movements or blood in stool.  No known family history of IBD. Other than chronic back pain patient says she is in good health.  She was in the [employer] but not any longer.  She got a medical release for back problems.  She has a Nexplanon in her arm for birth control, has not had any recent menstrual cycles    Past Medical History:  Diagnosis Date   Abnormal Pap smear of cervix    LEEP 2011   Anemia    with pregnancy   Chronic headaches    Cyst near coccyx    Hypoglycemia    Migraine    Pilonidal cyst    Pyelonephritis    x 3 episodes   Sickle cell trait ([place])     Past Surgical History:  Procedure Laterality Date   CERVICAL CONE BIOPSY     CERVICAL CONIZATION W/BX  2019   DILATION AND CURETTAGE OF UTERUS  2008   DILATION AND CURETTAGE OF UTERUS N/A 09/27/2018   Procedure: DILATATION AND CURETTAGE;  Surgeon: Woodroe Mode, MD;  Location: [facility];  Service: Gynecology;  Laterality: N/A;   LEEP  2011   OTHER SURGICAL HISTORY     Pynlodial cyst removed from tail bone 2012   OTHER SURGICAL HISTORY  10/2010   removal of cyst near coxxyx   PILONIDAL CYST EXCISION  10/2010    Prior to Admission medications   Medication Sig Start Date End Date Taking? Authorizing Provider  doxycycline (VIBRAMYCIN) 100 MG capsule Take 1 capsule (100 mg total) by mouth 2 (two) times daily. 08/04/19  Yes Lawyer, Harrell Gave, PA-C  etonogestrel (NEXPLANON) 68 MG IMPL implant 1 each by Subdermal route once.   Yes [provider]   oxyCODONE-acetaminophen (PERCOCET/ROXICET) 5-325 MG tablet Take 1-2 tablets by mouth every 6 (six) hours as needed. Patient taking differently: Take 1-2 tablets by mouth every 6 (six) hours as needed for moderate pain or severe pain.  09/27/18  Yes Woodroe Mode, MD  cephALEXin (KEFLEX) 500 MG capsule Take 1 capsule (500 mg total) by mouth 4 (four) times daily. Patient not taking: Reported on 08/18/2019 11/05/18   Larene Pickett, PA-C  HYDROcodone-acetaminophen (NORCO/VICODIN) 5-325 MG tablet Take 1 tablet by mouth every 4 (four) hours as needed. Patient not taking: Reported on 08/18/2019 11/05/18   Larene Pickett, PA-C  ibuprofen (ADVIL) 600 MG tablet Take 1 tablet (600 mg total) by mouth every 6 (six) hours as needed. Patient not taking: Reported on 08/18/2019 09/27/18   Woodroe Mode, MD    Current Facility-Administered Medications  Medication Dose Route Frequency Provider Last Rate Last Admin   0.9 %  sodium chloride infusion   Intravenous Continuous Shela Leff, MD 150 mL/hr at 08/19/19 0600 Rate Verify at 08/19/19 0600   acetaminophen (TYLENOL) tablet 650 mg  650 mg Oral Q6H PRN Shela Leff, MD       Or   acetaminophen (TYLENOL) suppository 650 mg  650 mg Rectal Q6H PRN Shela Leff, MD       ciprofloxacin (CIPRO) IVPB 400 mg  400 mg Intravenous Q12H Shela Leff, MD   Stopped at 08/19/19 [id number]   diphenhydrAMINE (BENADRYL) injection 12.5 mg  12.5 mg Intravenous Q6H PRN Shela Leff, MD       Derrill Memo ON 08/20/2019] influenza vac split quadrivalent PF (FLUARIX) injection 0.5 mL  0.5 mL Intramuscular Tomorrow-1000 Shela Leff, MD       metroNIDAZOLE (FLAGYL) IVPB 500 mg  500 mg Intravenous Quay Burow, MD 100 mL/hr at 08/19/19 0659 500 mg at 08/19/19 0659   morphine 2 MG/ML injection 1 mg  1 mg Intravenous Q3H PRN Shela Leff, MD   1 mg at 08/19/19 0849   ondansetron (ZOFRAN) injection 4 mg  4 mg Intravenous Q6H PRN Shela Leff, MD        Allergies as of 08/18/2019 - Review Complete 08/18/2019  Allergen Reaction Noted   Codeine Rash 04/13/2011   Septra [bactrim] Rash 04/13/2011    Family History  Problem Relation Age of Onset   Hypertension Brother    Cancer Maternal Aunt        breast    Social History   Socioeconomic History   Marital status: Married    Spouse name: Not on file   Number of children: Not on file   Years of education: Not on file   Highest education level: Not on file  Occupational History  Not on file  Tobacco Use   Smoking status: Former Smoker    Packs/day: 0.10    Types: Cigarettes    Quit date: 07/14/2014    Years since quitting: 5.1   Smokeless tobacco: Never Used  Substance and Sexual Activity   Alcohol use: No   Drug use: No   Sexual activity: Not Currently    Birth control/protection: Inserts  Other Topics Concern   Not on file  Social History Narrative   Not on file   Social Determinants of Health   Financial Resource Strain:    Difficulty of Paying Living Expenses:   Food Insecurity:    Worried About Charity fundraiser in the Last Year:    Arboriculturist in the Last Year:   Transportation Needs:    Film/video editor (Medical):    Lack of Transportation (Non-Medical):   Physical Activity:    Days of Exercise per Week:    Minutes of Exercise per Session:   Stress:    Feeling of Stress :   Social Connections:    Frequency of Communication with Friends and Family:    Frequency of Social Gatherings with Friends and Family:    Attends Religious Services:    Active Member of Clubs or Organizations:    Attends Music therapist:    Marital Status:   Intimate Partner Violence:    Fear of Current or Ex-Partner:    Emotionally Abused:    Physically Abused:    Sexually Abused:     Review of Systems: All systems reviewed and negative except where noted in HPI.  Physical Exam: Vital signs  in last 24 hours: Temp:  [98 F (36.7 C)-98.5 F (36.9 C)] 98 F (36.7 C) (03/29 0844) Pulse Rate:  [64-88] 76 (03/29 0844) Resp:  [18-20] 18 (03/29 0844) BP: (102-134)/(50-69) 110/56 (03/29 0844) SpO2:  [96 %-100 %] 96 % (03/29 0844) Weight:  [89 kg] 89 kg (03/29 0055) Last BM Date: 08/19/19 General:   Alert, well-developed,  female in NAD Psych:  Pleasant, cooperative. Normal mood and affect. Eyes:  Pupils equal, sclera clear, no icterus.   Conjunctiva pink. Ears:  Normal auditory acuity. Nose:  No deformity, discharge,  or lesions. Neck:  Supple; no masses Lungs:  Clear throughout to auscultation.   No wheezes, crackles, or rhonchi.  Heart:  Regular rate and rhythm; no murmurs, no lower extremity edema Abdomen:  Soft, non-distended, mild diffuse lower abdominal tenderness. A few BS, no palp mass   Rectal:  Deferred  Msk:  Symmetrical without gross deformities. . Neurologic:  Alert and  oriented x4;  grossly normal neurologically. Skin:  Intact without significant lesions or rashes.   Intake/Output from previous day: 03/28 0701 - 03/29 0700 In: 1931 [I.V.:613.8; IV Piggyback:1317.2] Out: -  Intake/Output this shift: No intake/output data recorded.  Lab Results: Recent Labs    08/18/19 1738 08/18/19 1738 08/19/19 0108 08/19/19 0448 08/19/19 0840  WBC 11.5*  --   --   --   --   HGB 13.2   < > 11.2* 10.9* 10.8*  HCT 39.0   < > 32.5* 31.9* 31.5*  PLT 327  --   --   --   --    < > = values in this interval not displayed.   BMET Recent Labs    08/18/19 1738  NA 142  K 4.1  CL 105  CO2 23  GLUCOSE 109*  BUN 13  CREATININE 0.74  CALCIUM 10.1   LFT Recent Labs    08/18/19 1738  PROT 8.4*  ALBUMIN 4.8  AST 22  ALT 16  ALKPHOS 54  BILITOT 0.6   PT/INR Recent Labs    08/18/19 1835  LABPROT 13.8  INR 1.1   Hepatitis Panel No results for input(s): HEPBSAG, HCVAB, HEPAIGM, HEPBIGM in the last 72 hours.   . CBC Latest Ref Rng & Units 08/19/2019  08/19/2019 08/19/2019  WBC 4.0 - 10.5 K/uL - - -  Hemoglobin 12.0 - 15.0 g/dL 10.8(L) 10.9(L) 11.2(L)  Hematocrit 36.0 - 46.0 % 31.5(L) 31.9(L) 32.5(L)  Platelets 150 - 400 K/uL - - -    . CMP Latest Ref Rng & Units 08/18/2019 09/27/2018 08/28/2017  Glucose 70 - 99 mg/dL 109(H) 109(H) 99  BUN 6 - 20 mg/dL 13 8 7   Creatinine 0.44 - 1.00 mg/dL 0.74 0.85 0.82  Sodium 135 - 145 mmol/L 142 137 139  Potassium 3.5 - 5.1 mmol/L 4.1 4.4 4.1  Chloride 98 - 111 mmol/L 105 109 109  CO2 22 - 32 mmol/L 23 19(L) 21(L)  Calcium 8.9 - 10.3 mg/dL 10.1 9.2 8.8(L)  Total Protein 6.5 - 8.1 g/dL 8.4(H) 7.7 6.2(L)  Total Bilirubin 0.3 - 1.2 mg/dL 0.6 1.3(H) 0.6  Alkaline Phos 38 - 126 U/L 54 45 50  AST 15 - 41 U/L 22 29 18   ALT 0 - 44 U/L 16 14 12(L)   Studies/Results: CT ABDOMEN PELVIS W CONTRAST  Result Date: 08/18/2019 CLINICAL DATA:  Pain. Lower GI bleed. EXAM: CT ABDOMEN AND PELVIS WITH CONTRAST TECHNIQUE: Multidetector CT imaging of the abdomen and pelvis was performed using the standard protocol following bolus administration of intravenous contrast. CONTRAST:  176mL OMNIPAQUE IOHEXOL 300 MG/ML  SOLN COMPARISON:  Sep 27, 2018 FINDINGS: Lower chest: The lung bases are clear. The heart size is normal. Hepatobiliary: The liver is normal. Normal gallbladder.There is no biliary ductal dilation. Pancreas: Normal contours without ductal dilatation. No peripancreatic fluid collection. Spleen: No splenic laceration or hematoma. Adrenals/Urinary Tract: --Adrenal glands: No adrenal hemorrhage. --Right kidney/ureter: No hydronephrosis or perinephric hematoma. --Left kidney/ureter: No hydronephrosis or perinephric hematoma. --Urinary bladder: Unremarkable. Stomach/Bowel: --Stomach/Duodenum: No hiatal hernia or other gastric abnormality. Normal duodenal course and caliber. --Small bowel: No dilatation or inflammation. --Colon: There is questionable mild wall thickening of the descending colon, proximal sigmoid colon. There are  few scattered colonic diverticula without CT evidence for diverticulitis. --Appendix: Normal. Vascular/Lymphatic: Multi are mild wall hila palpable not Earl into is tied this again --No retroperitoneal lymphadenopathy. --No mesenteric lymphadenopathy. --No pelvic or inguinal lymphadenopathy. Reproductive: Multiple fibroids are suspected. There is a simple appearing cystic structure involving the right ovary measuring approximately 2.2 cm. No further follow-up is required. Other: No ascites or free air. The abdominal wall is normal. Musculoskeletal. No acute displaced fractures. IMPRESSION: 1. Questionable mild wall thickening of the descending colon/proximal sigmoid colon could be seen in the setting of infectious or inflammatory colitis. 2. Scattered colonic diverticula without evidence of diverticulitis. 3. Normal appendix. 4. Fibroid uterus. Electronically Signed   By: Constance Holster M.D.   On: 08/18/2019 20:55    Principal Problem:   Colitis Active Problems:   Abdominal pain   Emesis   Hematochezia    Tye Savoy, NP-C @  08/19/2019, 9:13 AM

## 2019-08-19 NOTE — Progress Notes (Signed)
PROGRESS NOTE  Margaret Cameron  DOB: 1980/10/18  PCP: [facility] [id number]  DOA: 08/18/2019  LOS: 1 day   Chief Complaint  Patient presents with  . Abdominal Pain   Brief narrative: Margaret Cameron is a 39 y.o. female with PMH of migraine, sickle cell trait, chronic back pain.  She presented to the ED on 3/28 with complaint of abdominal cramping and bloody diarrhea.   Around 2 AM Sunday morning, patient started having diarrhea.  She had several episodes of diarrhea over the next few hours associated with abdominal cramps and tenesmus.  She later noticed bright red blood in her stool and that continued for 24 hours.  In the ED, patient was afebrile, WBC count 11.5, she has chronic mild leukocytosis Hemoglobin 10.8, baseline 12-13. CT scan abdomen pelvis suggested mild wall thickening of descending / proximal sigmoid colon.  Patient was started on Cipro / flagyl / IV hydration.  C. difficile studies showed toxin positive but antigen and PCR negative.  Subjective: Patient was seen and examined this morning.  Young African-American female.  Lying down in bed.  Not in distress.  Tired of diarrhea  Assessment/Plan: Code 70 -Presented with crampy abdominal pain and diarrhea with blood. -Infectious etiology suspected. C. difficile studies showed toxin positive but antigen and PCR negative. -GI pathogen panel sent.  Pending report. -Currently on IV ciprofloxacin IV Flagyl and IV hydration.  IV antiemetics. -Hemoglobin 10.8.  Transfuse if less than 8.  Mobility: Encourage ambulation Code Status:  Full code  DVT prophylaxis:  SCDs Antimicrobials:  IV ciprofloxacin and IV Flagyl Fluid: Normal saline at 100 mL/h Diet: Clear liquid diet  Family Communication:  Not at bedside Admitted From: Home Anticipated date and disposition: Hopefully home in 1 to 2 days Barriers: Continuous diarrhea  Consultants:  GI  Antimicrobials: Anti-infectives (From  admission, onward)   Start     Dose/Rate Route Frequency Ordered Stop   08/19/19 1000  vancomycin (VANCOCIN) 50 mg/mL oral solution 125 mg  Status:  Discontinued     125 mg Oral 4 times daily 08/19/19 0816 08/19/19 0828   08/18/19 2300  metroNIDAZOLE (FLAGYL) IVPB 500 mg     500 mg 100 mL/hr over 60 Minutes Intravenous Every 8 hours 08/18/19 2206     08/18/19 2300  ciprofloxacin (CIPRO) IVPB 400 mg     40 0 mg 200 mL/hr over 60 Minutes Intravenous Every 12 hours 08/18/19 2208          Code Status: Full Code   Diet Order            Diet clear liquid Room service appropriate? Yes; Fluid consistency: Thin  Diet effective now              Infusions:  . sodium chloride    . ciprofloxacin 400 mg (08/19/19 1218)  . metronidazole 500 mg (08/19/19 0659)    Scheduled Meds: . [START ON 08/20/2019] influenza vac split quadrivalent PF  0.5 mL Intramuscular Tomorrow-1000    PRN meds: acetaminophen **OR** acetaminophen, diphenhydrAMINE, morphine injection, ondansetron (ZOFRAN) IV   Objective: Vitals:   08/19/19 0844 08/19/19 1254  BP: (!) 110/56 109/65  Pulse: 76 81  Resp: 18 18  Temp: 98 F (36.7 C) 97.9 F (36.6 C)  SpO2: 96% 97%    Intake/Output Summary (Last 24 hours) at 08/19/2019 1405 Last data filed at 08/19/2019 0600 Gross per 24 hour  Intake 1931.01 ml  Output --  Net 1931.01 ml   [employer]  08/19/19 0055  Weight: 89 kg   Weight change:  Body mass index is 31.68 kg/m.   Physical Exam: General exam: Appears calm and comfortable.  Skin: No rashes, lesions or ulcers. HEENT: Atraumatic, normocephalic, supple neck, no obvious bleeding Lungs: Clear to auscultation bilaterally CVS: Regular rate and rhythm, no murmur GI/Abd soft, mild diffuse tenderness, bowel sound present CNS: Alert, awake, oriented x3 Psychiatry: Mood appropriate Extremities: No pedal edema, no calf tenderness  Data Review: I have personally reviewed the laboratory data and studies  available.  Recent Labs  Lab 08/18/19 1738 08/19/19 0108 08/19/19 0448 08/19/19 0840  WBC 11.5*  --   --   --   HGB 13.2 11.2* 10.9* 10.8*  HCT 39.0 32.5* 31.9* 31.5*  MCV 91.1  --   --   --   PLT 327  --   --   --    Recent Labs  Lab 08/18/19 1738  NA 142  K 4.1  CL 105  CO2 23  GLUCOSE 109*  BUN 13  CREATININE 0.74  CALCIUM 10.1    Signed, Terrilee Croak, MD [facility] Pager: [phone] (Secure Chat preferred). 08/19/2019

## 2019-08-20 DIAGNOSIS — A09 Infectious gastroenteritis and colitis, unspecified: Secondary | ICD-10-CM | POA: Diagnosis not present

## 2019-08-20 LAB — CLOSTRIDIUM DIFFICILE BY PCR, REFLEXED: Toxigenic C. Difficile by PCR: NEGATIVE

## 2019-08-20 NOTE — Plan of Care (Signed)

## 2019-08-20 NOTE — Progress Notes (Addendum)
     Progress Note    ASSESSMENT AND PLAN:    # Crampy diarrhea with blood. Resolved.  --Suspect infectious etiology. Ischemia also consideration --No stools or blood today. Still some minor cramps (feels like needs to have a BM).  --Minor cramping with clear liquids yesterday. Hasn't had breakfast yet. Will order low residue diet for lunch ( assuming she will do okay with clears for breakfast).  --Continue antibiotics.  --Gi path panel pending. Hopefully can be discharged soon with office follow up.      [facility] Attending   I have taken an interval history, reviewed the chart and examined the patient. I agree with the Advanced Practitioner's note, impression and recommendations.     Improving  ? Home tomorrow w/ [facility] PCP follow-up and [facility] APP f/u  Gatha Mayer, MD, [facility] 08/20/2019 3:16 PM   SUBJECTIVE    No further bleeding. No diarrhea since prior to admission. Still some minor lower abdominal cramps   OBJECTIVE:     Vital signs in last 24 hours: Temp:  [97.8 F (36.6 C)-98.4 F (36.9 C)] 97.8 F (36.6 C) (03/30 0500) Pulse Rate:  [62-81] 62 (03/30 0500) Resp:  [18-20] 18 (03/30 0500) BP: (96-119)/(65-77) 119/70 (03/30 0500) SpO2:  [96 %-97 %] 96 % (03/30 0500) Last BM Date: 08/19/19 General:   Alert, well-developed female in NAD EENT:  Normal hearing, non icteric sclera   Heart:  Regular rate and rhythm;  No lower extremity edema   Pulm: Normal respiratory effort   Abdomen:  Soft, nondistended, nontender.  Normal bowel sounds.          Neurologic:  Alert and  oriented x4;  grossly normal neurologically. Psych:  Pleasant, cooperative.  Normal mood and affect.   Intake/Output from previous day: 03/29 0701 - 03/30 0700 In: 916.1 [I.V.:516.1; IV Piggyback:400] Out: 2 [Urine:2] Intake/Output this shift: No intake/output data recorded.  Lab Results: Recent Labs    08/18/19 1738 08/18/19 1738 08/19/19 0108 08/19/19  0448 08/19/19 0840  WBC 11.5*  --   --   --   --   HGB 13.2   < > 11.2* 10.9* 10.8*  HCT 39.0   < > 32.5* 31.9* 31.5*  PLT 327  --   --   --   --    < > = values in this interval not displayed.           LOS: 2 days   Tye Savoy ,NP 08/20/2019, 9:17 AM

## 2019-08-20 NOTE — Progress Notes (Signed)
PROGRESS NOTE  Margaret Cameron  DOB: 04-23-1981  PCP: [facility] [id number]  DOA: 08/18/2019  LOS: 2 days   Chief Complaint  Patient presents with  . Abdominal Pain   Brief narrative: Margaret Cameron is a 39 y.o. female with PMH of migraine, sickle cell trait, chronic back pain.  She presented to the ED on 3/28 with complaint of abdominal cramping and bloody diarrhea.   Around 2 AM Sunday morning, patient started having diarrhea.  She had several episodes of diarrhea over the next few hours associated with abdominal cramps and tenesmus.  She later noticed bright red blood in her stool and that continued for 24 hours.  In the ED, patient was afebrile, WBC count 11.5, she has chronic mild leukocytosis Hemoglobin 10.8, baseline 12-13. CT scan abdomen pelvis suggested mild wall thickening of descending / proximal sigmoid colon.  Patient was started on Cipro / flagyl / IV hydration.  C. difficile studies showed toxin positive but antigen and PCR negative.  Subjective: Patient was seen and examined this morning.  Young African-American female.  Lying down in bed.  Not in distress.  Tired of diarrhea  Assessment/Plan: Acute gastroenteritis [facility] -Presented with crampy abdominal pain and diarrhea with blood. -Infectious etiology suspected. C. difficile studies showed toxin positive but antigen and PCR negative. -GI pathogen panel sent.  Pending report. -Currently on IV ciprofloxacin IV Flagyl and IV hydration.  IV antiemetics.  Patient feels better.  She still has tenesmus but no output.  No bleeding either.  Able to tolerate clear liquid diet.  Advance to low residue diet this morning. -GI pathogen panel still waiting for.  Mobility: Encourage ambulation Code Status:  Full code  DVT prophylaxis:  SCDs Antimicrobials:  IV ciprofloxacin and IV Flagyl Fluid: Continue normal saline at 100 mL/h Diet: Low residue diet  Family Communication:  Not at  bedside Admitted From: Home Anticipated date and disposition: Hopefully home in 1 to 2 days Barriers: Continuous diarrhea  Consultants:  GI  Antimicrobials: Anti-infectives (From admission, onward)   Start     Dose/Rate Route Frequency Ordered Stop   08/19/19 1000  vancomycin (VANCOCIN) 50 mg/mL oral solution 125 mg  Status:  Discontinued     125 mg Oral 4 times daily 08/19/19 0816 08/19/19 0828   08/18/19 2300  metroNIDAZOLE (FLAGYL) IVPB 500 mg     500 mg 100 mL/hr over 60 Minutes Intravenous Every 8 hours 08/18/19 2206     08/18/19 2300  ciprofloxacin (CIPRO) IVPB 400 mg     40 0 mg 200 mL/hr over 60 Minutes Intravenous Every 12 hours 08/18/19 2208          Code Status: Full Code   Diet Order            DIET SOFT Room service appropriate? Yes; Fluid consistency: Thin  Diet effective now              Infusions:  . sodium chloride 100 mL/hr at 08/20/19 1030  . ciprofloxacin 400 mg (08/20/19 1118)  . metronidazole 500 mg (08/20/19 0602)    Scheduled Meds:   PRN meds: acetaminophen **OR** acetaminophen, diphenhydrAMINE, morphine injection, ondansetron (ZOFRAN) IV   Objective: Vitals:   08/19/19 2148 08/20/19 0500  BP: 115/67 119/70  Pulse: 70 62  Resp: 20 18  Temp: 98.4 F (36.9 C) 97.8 F (36.6 C)  SpO2: 97% 96%    Intake/Output Summary (Last 24 hours) at 08/20/2019 1246 Last data filed at 08/20/2019 1000 Gross per  24 hour  Intake 2215.42 ml  Output 2 ml  Net 2213.42 ml   Filed Weights   08/19/19 0055  Weight: 89 kg   Weight change:  Body mass index is 31.68 kg/m.   Physical Exam: General exam: Appears calm and comfortable..  Feels better today Skin: No rashes, lesions or ulcers. HEENT: Atraumatic, normocephalic, supple neck, no obvious bleeding Lungs: Clear to auscultation bilaterally CVS: Regular rate and rhythm, no murmur GI/Abd soft, improving tenderness, bowel sound present CNS: Alert, awake, oriented x3 Psychiatry: Mood  appropriate Extremities: No pedal edema, no calf tenderness  Data Review: I have personally reviewed the laboratory data and studies available.  Recent Labs  Lab 08/18/19 1738 08/19/19 0108 08/19/19 0448 08/19/19 0840  WBC 11.5*  --   --   --   HGB 13.2 11.2* 10.9* 10.8*  HCT 39.0 32.5* 31.9* 31.5*  MCV 91.1  --   --   --   PLT 327  --   --   --    Recent Labs  Lab 08/18/19 1738  NA 142  K 4.1  CL 105  CO2 23  GLUCOSE 109*  BUN 13  CREATININE 0.74  CALCIUM 10.1    Signed, Terrilee Croak, MD [facility] Pager: [phone] (Secure Chat preferred). 08/20/2019

## 2019-08-21 ENCOUNTER — Encounter (HOSPITAL_COMMUNITY): Payer: Self-pay | Admitting: Internal Medicine

## 2019-08-21 DIAGNOSIS — K921 Melena: Secondary | ICD-10-CM

## 2019-08-21 LAB — CBC WITH DIFFERENTIAL/PLATELET
Abs Immature Granulocytes: 0.01 10*3/uL (ref 0.00–0.07)
Basophils Absolute: 0.1 10*3/uL (ref 0.0–0.1)
Basophils Relative: 1 %
Eosinophils Absolute: 0.1 10*3/uL (ref 0.0–0.5)
Eosinophils Relative: 1 %
HCT: 30.8 % — ABNORMAL LOW (ref 36.0–46.0)
Hemoglobin: 10.4 g/dL — ABNORMAL LOW (ref 12.0–15.0)
Immature Granulocytes: 0 %
Lymphocytes Relative: 30 %
Lymphs Abs: 1.7 10*3/uL (ref 0.7–4.0)
MCH: 31 pg (ref 26.0–34.0)
MCHC: 33.8 g/dL (ref 30.0–36.0)
MCV: 91.9 fL (ref 80.0–100.0)
Monocytes Absolute: 0.4 10*3/uL (ref 0.1–1.0)
Monocytes Relative: 8 %
Neutro Abs: 3.4 10*3/uL (ref 1.7–7.7)
Neutrophils Relative %: 60 %
Platelets: 192 10*3/uL (ref 150–400)
RBC: 3.35 MIL/uL — ABNORMAL LOW (ref 3.87–5.11)
RDW: 13.4 % (ref 11.5–15.5)
WBC: 5.7 10*3/uL (ref 4.0–10.5)
nRBC: 0 % (ref 0.0–0.2)

## 2019-08-21 LAB — BASIC METABOLIC PANEL
Anion gap: 5 (ref 5–15)
BUN: 9 mg/dL (ref 6–20)
CO2: 24 mmol/L (ref 22–32)
Calcium: 8.3 mg/dL — ABNORMAL LOW (ref 8.9–10.3)
Chloride: 110 mmol/L (ref 98–111)
Creatinine, Ser: 0.79 mg/dL (ref 0.44–1.00)
GFR calc Af Amer: >60 mL/min (ref >60)
GFR calc non Af Amer: >60 mL/min (ref >60)
Glucose, Bld: 94 mg/dL (ref 70–99)
Potassium: 3.7 mmol/L (ref 3.5–5.1)
Sodium: 139 mmol/L (ref 135–145)

## 2019-08-21 LAB — MAGNESIUM: Magnesium: 1.8 mg/dL (ref 1.7–2.4)

## 2019-08-21 MED ORDER — POLYETHYLENE GLYCOL 3350 17 G PO PACK: 17.0000 g | PACK | Freq: Every day | ORAL | Status: DC

## 2019-08-21 NOTE — Discharge Summary (Signed)
Physician Discharge Summary  Margaret Cameron [id number] DOB: 05/03/1981 DOA: 08/18/2019  PCP: [facility], [facility]  Admit date: 08/18/2019 Discharge date: 08/21/2019  Admitted From: Home Disposition: Home  Recommendations for Outpatient Follow-up:  1. Follow up with PCP in 1 week 2. Please follow up on the following pending results: GI pathogen panel  Home Health: None Equipment/Devices: None  Discharge Condition: Stable CODE STATUS: Full code Diet recommendation: Regular diet   Brief/Interim Summary:  Admission HPI written by Shela Leff, MD   Chief Complaint: Abdominal pain, rectal bleeding  HPI: Margaret Cameron is a 39 y.o. female with medical history significant of anemia, sickle cell trait, migraine headaches, pyelonephritis presenting to the ED with complaints of abdominal pain and rectal bleeding.  Patient states since 2 AM last night she has had cramping lower abdominal pain.  She initially had diarrhea but now just noticing bright red blood per rectum when she uses the toilet.  She has had 6 episodes of rectal bleeding.  Also reports a few episodes of nonbloody emesis.  She is having chills.  No reported fevers.  Denies any recent sick contacts.  No one else at home is experiencing similar symptoms.  Denies recent travel or trying any new foods.  States she was recently treated with a course of doxycycline for a cyst in her armpit.  Denies cough, shortness of breath, or chest pain.  ED Course: Afebrile.  Hemodynamically stable.  WBC 11.5, has mild chronic leukocytosis on prior labs.  Hemoglobin 13.2.  Beta-hCG negative.  LFTs normal.  INR 1.1. CT abdomen pelvis showing questionable mild wall thickening of the descending colon/proximal sigmoid colon which could be seen in the setting of infectious or inflammatory colitis.  Scattered colonic diverticula without evidence of diverticulitis.  Patient received Zofran, fentanyl, and 1 L normal  saline bolus.   Hospital course:  Acute gastroenteritis Likely infectious etiology. Patient empirically started on Ciprofloxacin and Flagyl IV. C. Difficile was toxin positive but antigen/PCR negative. GI pathogen panel obtained and pending at the time of discharge. GI consulted and recommended no antibiotic treatment. Recommendation for outpatient follow-up with GI for which the patient will go to the [place]. Patient's diarrhea improved prior to discharge.  Hematochezia Secondary to above.  Discharge Diagnoses:  Principal Problem:   Colitis Active Problems:   Abdominal pain   Emesis   Hematochezia    Discharge Instructions   Allergies as of 08/21/2019      Reactions   Codeine Rash   Septra [bactrim] Rash      Medication List    STOP taking these medications   cephALEXin 500 MG capsule Commonly known as: KEFLEX   doxycycline 100 MG capsule Commonly known as: VIBRAMYCIN   HYDROcodone-acetaminophen 5-325 MG tablet Commonly known as: NORCO/VICODIN   ibuprofen 600 MG tablet Commonly known as: ADVIL     TAKE these medications   etonogestrel 68 MG Impl implant Commonly known as: NEXPLANON 1 each by Subdermal route once.   oxyCODONE-acetaminophen 5-325 MG tablet Commonly known as: PERCOCET/ROXICET Take 1-2 tablets by mouth every 6 (six) hours as needed. What changed: reasons to take this   polyethylene glycol 17 g packet Commonly known as: MIRALAX / GLYCOLAX Take 17 g by mouth daily.       Allergies  Allergen Reactions  . Codeine Rash  . Septra [Bactrim] Rash    Consultations:  Gastroenterology   Procedures/Studies: CT ABDOMEN PELVIS W CONTRAST  Result Date: 08/18/2019 CLINICAL DATA:  Pain. Lower  GI bleed. EXAM: CT ABDOMEN AND PELVIS WITH CONTRAST TECHNIQUE: Multidetector CT imaging of the abdomen and pelvis was performed using the standard protocol following bolus administration of intravenous contrast. CONTRAST:  137mL OMNIPAQUE IOHEXOL 300 MG/ML   SOLN COMPARISON:  Sep 27, 2018 FINDINGS: Lower chest: The lung bases are clear. The heart size is normal. Hepatobiliary: The liver is normal. Normal gallbladder.There is no biliary ductal dilation. Pancreas: Normal contours without ductal dilatation. No peripancreatic fluid collection. Spleen: No splenic laceration or hematoma. Adrenals/Urinary Tract: --Adrenal glands: No adrenal hemorrhage. --Right kidney/ureter: No hydronephrosis or perinephric hematoma. --Left kidney/ureter: No hydronephrosis or perinephric hematoma. --Urinary bladder: Unremarkable. Stomach/Bowel: --Stomach/Duodenum: No hiatal hernia or other gastric abnormality. Normal duodenal course and caliber. --Small bowel: No dilatation or inflammation. --Colon: There is questionable mild wall thickening of the descending colon, proximal sigmoid colon. There are few scattered colonic diverticula without CT evidence for diverticulitis. --Appendix: Normal. Vascular/Lymphatic: Multi are mild wall hila palpable not Earl into is tied this again --No retroperitoneal lymphadenopathy. --No mesenteric lymphadenopathy. --No pelvic or inguinal lymphadenopathy. Reproductive: Multiple fibroids are suspected. There is a simple appearing cystic structure involving the right ovary measuring approximately 2.2 cm. No further follow-up is required. Other: No ascites or free air. The abdominal wall is normal. Musculoskeletal. No acute displaced fractures. IMPRESSION: 1. Questionable mild wall thickening of the descending colon/proximal sigmoid colon could be seen in the setting of infectious or inflammatory colitis. 2. Scattered colonic diverticula without evidence of diverticulitis. 3. Normal appendix. 4. Fibroid uterus. Electronically Signed   By: Constance Holster M.D.   On: 08/18/2019 20:55      Subjective: No hematochezia noted. No issues overnight.  Discharge Exam: Vitals:   08/20/19 2109 08/21/19 0446  BP: 122/82 116/73  Pulse: 69 70  Resp: 18 18  Temp:  99.1 F (37.3 C) 98.2 F (36.8 C)  SpO2: 97% 98%   Vitals:   08/20/19 0500 08/20/19 1359 08/20/19 2109 08/21/19 0446  BP: 119/70 122/76 122/82 116/73  Pulse: 62 68 69 70  Resp: 18 16 18 18   Temp: 97.8 F (36.6 C) 98.4 F (36.9 C) 99.1 F (37.3 C) 98.2 F (36.8 C)  TempSrc: Oral Oral Oral Oral  SpO2: 96% 97% 97% 98%  Weight:      Height:        General: Margaret Cameron is alert, awake, not in acute distress Cardiovascular: RRR, S1/S2 +, no rubs, no gallops Respiratory: CTA bilaterally, no wheezing, no rhonchi Abdominal: Soft, NT, ND, bowel sounds + Extremities: no edema, no cyanosis    The results of significant diagnostics from this hospitalization (including imaging, microbiology, ancillary and laboratory) are listed below for reference.     Microbiology: Recent Results (from the past 240 hour(s))  C Difficile Quick Screen w PCR reflex     Status: Abnormal   Collection Time: 08/18/19 10:25 PM   Specimen: STOOL  Result Value Ref Range Status   C Diff antigen NEGATIVE NEGATIVE Final   C Diff toxin POSITIVE (A) NEGATIVE Final   C Diff interpretation Results are indeterminate. See PCR results.  Final    Comment: Performed at [facility], [place] [address] [place], [place] [address]  C. Diff by PCR, Reflexed     Status: None   Collection Time: 08/18/19 10:25 PM  Result Value Ref Range Status   Toxigenic C. Difficile by PCR NEGATIVE NEGATIVE Final    Comment: Patient is colonized with non toxigenic C. difficile. May not need treatment unless significant  symptoms are present. Performed at [facility], [place] [address], [place], [place] [address]   C. Diff by PCR, Reflexed     Status: None   Collection Time: 08/18/19 10:25 PM  Result Value Ref Range Status   Toxigenic C. Difficile by PCR NEGATIVE NEGATIVE Final    Comment: Patient is colonized with non toxigenic C. difficile. May not need treatment unless significant symptoms are present. Performed at [facility], [place] [address], [place], [place] [address]   SARS CORONAVIRUS 2 (TAT 6-24 HRS) Nasopharyngeal Nasopharyngeal Swab     Status: None   Collection Time: 08/18/19 10:36 PM   Specimen: Nasopharyngeal Swab  Result Value Ref Range Status   SARS Coronavirus 2 NEGATIVE NEGATIVE Final    Comment: (NOTE) SARS-CoV-2 target nucleic acids are NOT DETECTED. The SARS-CoV-2 RNA is generally detectable in upper and lower respiratory specimens during the acute phase of infection. Negative results do not preclude SARS-CoV-2 infection, do not rule out co-infections with other pathogens, and should not be used as the sole basis for treatment or other patient management decisions. Negative results must be combined with clinical observations, patient history, and epidemiological information. The expected result is Negative. Fact Sheet for Patients: SugarRoll.be Fact Sheet for Healthcare Providers: https://www.woods-mathews.com/ This test is not yet approved or cleared by the [place] FDA and  has been authorized for detection and/or diagnosis of SARS-CoV-2 by FDA under an Emergency Use Authorization (EUA). This EUA will remain  in effect (meaning this test can be used) for the duration of the COVID-19 declaration under Section 56 4(b)(1) of the Act, 21 U.S.C. section 360bbb-3(b)(1), unless the authorization is terminated or revoked sooner. Performed at [facility], [place] [address] [place], [place] [address]      Labs: BNP (last 3 results) No results for input(s): BNP in the last 8760 hours. Basic Metabolic Panel: Recent Labs  Lab 08/18/19 1738 08/21/19 0511  NA 142 139  K 4.1 3.7  CL 105 110  CO2 23 24  GLUCOSE 109* 94  BUN 13 9  CREATININE 0.74 0.79  CALCIUM 10.1 8.3*  MG  --  1.8   Liver Function Tests: Recent Labs  Lab 08/18/19 1738  AST 22  ALT 16  ALKPHOS 54  BILITOT 0.6  PROT 8.4*  ALBUMIN 4.8   No results  for input(s): LIPASE, AMYLASE in the last 168 hours. No results for input(s): AMMONIA in the last 168 hours. CBC: Recent Labs  Lab 08/18/19 1738 08/19/19 0108 08/19/19 0448 08/19/19 0840 08/21/19 0511  WBC 11.5*  --   --   --  5.7  NEUTROABS  --   --   --   --  3.4  HGB 13.2 11.2* 10.9* 10.8* 10.4*  HCT 39.0 32.5* 31.9* 31.5* 30.8*  MCV 91.1  --   --   --  91.9  PLT 327  --   --   --  192   Cardiac Enzymes: No results for input(s): CKTOTAL, CKMB, CKMBINDEX, TROPONINI in the last 168 hours. BNP: Invalid input(s): POCBNP CBG: No results for input(s): GLUCAP in the last 168 hours. D-Dimer No results for input(s): DDIMER in the last 72 hours. Hgb A1c No results for input(s): HGBA1C in the last 72 hours. Lipid Profile No results for input(s): CHOL, HDL, LDLCALC, TRIG, CHOLHDL, LDLDIRECT in the last 72 hours. Thyroid function studies No results for input(s): TSH, T4TOTAL, T3FREE, THYROIDAB in the last 72 hours.  Invalid input(s):  FREET3 Anemia work up No results for input(s): VITAMINB12, FOLATE, FERRITIN, TIBC, IRON, RETICCTPCT in the last 72 hours. Urinalysis    Component Value Date/Time   COLORURINE YELLOW 09/27/2018 1055   APPEARANCEUR CLEAR 09/27/2018 1055   LABSPEC 1.014 09/27/2018 1055   PHURINE 5.0 09/27/2018 1055   GLUCOSEU NEGATIVE 09/27/2018 1055   HGBUR MODERATE (A) 09/27/2018 1055   BILIRUBINUR NEGATIVE 09/27/2018 1055   KETONESUR NEGATIVE 09/27/2018 1055   PROTEINUR NEGATIVE 09/27/2018 1055   UROBILINOGEN 0.2 10/30/2014 0920   NITRITE NEGATIVE 09/27/2018 1055   LEUKOCYTESUR NEGATIVE 09/27/2018 1055   Sepsis Labs Invalid input(s): PROCALCITONIN,  WBC,  LACTICIDVEN Microbiology Recent Results (from the past 240 hour(s))  C Difficile Quick Screen w PCR reflex     Status: Abnormal   Collection Time: 08/18/19 10:25 PM   Specimen: STOOL  Result Value Ref Range Status   C Diff antigen NEGATIVE NEGATIVE Final   C Diff toxin POSITIVE (A) NEGATIVE Final   C  Diff interpretation Results are indeterminate. See PCR results.  Final    Comment: Performed at [facility], [place] [address] [place], [place] [address]  C. Diff by PCR, Reflexed     Status: None   Collection Time: 08/18/19 10:25 PM  Result Value Ref Range Status   Toxigenic C. Difficile by PCR NEGATIVE NEGATIVE Final    Comment: Patient is colonized with non toxigenic C. difficile. May not need treatment unless significant symptoms are present. Performed at [facility], [place] [address] [place], [place] [address]   C. Diff by PCR, Reflexed     Status: None   Collection Time: 08/18/19 10:25 PM  Result Value Ref Range Status   Toxigenic C. Difficile by PCR NEGATIVE NEGATIVE Final    Comment: Patient is colonized with non toxigenic C. difficile. May not need treatment unless significant symptoms are present. Performed at [facility], [place] [address], [place], [place] [address]   SARS CORONAVIRUS 2 (TAT 6-24 HRS) Nasopharyngeal Nasopharyngeal Swab     Status: None   Collection Time: 08/18/19 10:36 PM   Specimen: Nasopharyngeal Swab  Result Value Ref Range Status   SARS Coronavirus 2 NEGATIVE NEGATIVE Final    Comment: (NOTE) SARS-CoV-2 target nucleic acids are NOT DETECTED. The SARS-CoV-2 RNA is generally detectable in upper and lower respiratory specimens during the acute phase of infection. Negative results do not preclude SARS-CoV-2 infection, do not rule out co-infections with other pathogens, and should not be used as the sole basis for treatment or other patient management decisions. Negative results must be combined with clinical observations, patient history, and epidemiological information. The expected result is Negative. Fact Sheet for Patients: SugarRoll.be Fact Sheet for Healthcare Providers: https://www.woods-mathews.com/ This test is not yet approved or cleared by the [place] FDA and  has been  authorized for detection and/or diagnosis of SARS-CoV-2 by FDA under an Emergency Use Authorization (EUA). This EUA will remain  in effect (meaning this test can be used) for the duration of the COVID-19 declaration under Section 56 4(b)(1) of the Act, 21 U.S.C. section 360bbb-3(b)(1), unless the authorization is terminated or revoked sooner. Performed at [facility], [place] [address], [place], [place] [address]      Time coordinating discharge: 35 minutes  SIGNED:   Cordelia Poche, MD [facility] 08/21/2019, 12:35 PM

## 2019-08-21 NOTE — Progress Notes (Addendum)
     Progress Note    ASSESSMENT AND PLAN:   # Crampy diarrhea with blood. Resolved.  --Suspect infectious etiology. Ischemia also consideration --Now with mild constipation ( chronic problem)  --Tolerating solids. Would continue low fiber diet for next few days.  --Probably fine to stop antibiotics at discharge --Gi path panel still pending but she is stable for discharge from GI standpoint.  --Offered GI follow up with [place]. She has already [facility] and will see a GI with the [facility] if needed.  --Can take daily miralax until seen by her PCP.                 SUBJECTIVE   No complaints. No bleeding or diarrhea. Small BM this am, she is a little constipated.    OBJECTIVE:     Vital signs in last 24 hours: Temp:  [98.2 F (36.8 C)-99.1 F (37.3 C)] 98.2 F (36.8 C) (03/31 0446) Pulse Rate:  [68-70] 70 (03/31 0446) Resp:  [16-18] 18 (03/31 0446) BP: (116-122)/(73-82) 116/73 (03/31 0446) SpO2:  [97 %-98 %] 98 % (03/31 0446) Last BM Date: 08/20/19 General:   Alert, well-developed female in NAD EENT:  Normal hearing, non icteric sclera   Heart:  Regular rate and rhythm;  No lower extremity edema   Pulm: Normal respiratory effort   Abdomen:  Soft, nondistended, nontender.  Normal bowel sounds.          Neurologic:  Alert and  oriented x4;  grossly normal neurologically. Psych:  Pleasant, cooperative.  Normal mood and affect.   Intake/Output from previous day: 03/30 0701 - 03/31 0700 In: 2782.4 [P.O.:480; I.V.:1602.8; IV Piggyback:699.7] Out: -  Intake/Output this shift: No intake/output data recorded.  Lab Results: Recent Labs    08/18/19 1738 08/19/19 0108 08/19/19 0448 08/19/19 0840 08/21/19 0511  WBC 11.5*  --   --   --  5.7  HGB 13.2   < > 10.9* 10.8* 10.4*  HCT 39.0   < > 31.9* 31.5* 30.8*  PLT 327  --   --   --  192   < > = values in this interval not displayed.   BMET Recent Labs    08/18/19 1738 08/21/19 0511  NA 142 139  K  4.1 3.7  CL 105 110  CO2 23 24  GLUCOSE 109* 94  BUN 13 9  CREATININE 0.74 0.79  CALCIUM 10.1 8.3*   LFT Recent Labs    08/18/19 1738  PROT 8.4*  ALBUMIN 4.8  AST 22  ALT 16  ALKPHOS 54  BILITOT 0.6   PT/INR Recent Labs    08/18/19 1835  LABPROT 13.8  INR 1.1    Principal Problem:   Colitis Active Problems:   Abdominal pain   Emesis   Hematochezia     LOS: 3 days   Tye Savoy ,NP 08/21/2019, 9:56 AM   Agree with Ms. Vanita Ingles assessment and plan. Gatha Mayer, MD, [facility]

## 2019-12-30 ENCOUNTER — Emergency Department (HOSPITAL_COMMUNITY)
Admission: EM | Admit: 2019-12-30 | Discharge: 2019-12-31 | Disposition: A | Discharge status: Home / Self Care | Payer: No Typology Code available for payment source | Attending: Emergency Medicine | Admitting: Emergency Medicine

## 2019-12-30 ENCOUNTER — Other Ambulatory Visit: Payer: Self-pay

## 2019-12-30 DIAGNOSIS — R1030 Lower abdominal pain, unspecified: Secondary | ICD-10-CM | POA: Diagnosis present

## 2019-12-30 DIAGNOSIS — Z5321 Procedure and treatment not carried out due to patient leaving prior to being seen by health care provider: Secondary | ICD-10-CM | POA: Insufficient documentation

## 2019-12-30 LAB — CBC
HCT: 38.1 % (ref 36.0–46.0)
Hemoglobin: 12.9 g/dL (ref 12.0–15.0)
MCH: 30.9 pg (ref 26.0–34.0)
MCHC: 33.9 g/dL (ref 30.0–36.0)
MCV: 91.1 fL (ref 80.0–100.0)
Platelets: 276 10*3/uL (ref 150–400)
RBC: 4.18 MIL/uL (ref 3.87–5.11)
RDW: 13.5 % (ref 11.5–15.5)
WBC: 9.3 10*3/uL (ref 4.0–10.5)
nRBC: 0 % (ref 0.0–0.2)

## 2019-12-30 LAB — COMPREHENSIVE METABOLIC PANEL
ALT: 28 U/L (ref 0–44)
AST: 27 U/L (ref 15–41)
Albumin: 4.2 g/dL (ref 3.5–5.0)
Alkaline Phosphatase: 47 U/L (ref 38–126)
Anion gap: 9 (ref 5–15)
BUN: 12 mg/dL (ref 6–20)
CO2: 28 mmol/L (ref 22–32)
Calcium: 9.6 mg/dL (ref 8.9–10.3)
Chloride: 103 mmol/L (ref 98–111)
Creatinine, Ser: 0.79 mg/dL (ref 0.44–1.00)
GFR calc Af Amer: >60 mL/min (ref >60)
GFR calc non Af Amer: >60 mL/min (ref >60)
Glucose, Bld: 90 mg/dL (ref 70–99)
Potassium: 3.8 mmol/L (ref 3.5–5.1)
Sodium: 140 mmol/L (ref 135–145)
Total Bilirubin: 0.6 mg/dL (ref 0.3–1.2)
Total Protein: 7 g/dL (ref 6.5–8.1)

## 2019-12-30 LAB — I-STAT BETA HCG BLOOD, ED (MC, WL, AP ONLY): I-stat hCG, quantitative: <5 m[IU]/mL (ref <5)

## 2019-12-30 LAB — LIPASE, BLOOD: Lipase: 21 U/L (ref 11–51)

## 2019-12-30 NOTE — ED Triage Notes (Signed)
Per pt, Pt complaining of lower abd pain, described as cramping, and pressure for the last week. Pt also states that that she has pain, and difficulty going to the bathroom. Pt has been able to use void and pass a BM today. Pt also states she had her birth control removed back in June, has not had a period since.

## 2019-12-31 NOTE — ED Notes (Signed)
No answer for V/S recheck x1

## 2020-02-12 ENCOUNTER — Emergency Department (HOSPITAL_COMMUNITY)
Admission: EM | Admit: 2020-02-12 | Discharge: 2020-02-13 | Disposition: A | Discharge status: Home / Self Care | Payer: No Typology Code available for payment source | Attending: Emergency Medicine | Admitting: Emergency Medicine

## 2020-02-12 ENCOUNTER — Other Ambulatory Visit: Payer: Self-pay

## 2020-02-12 ENCOUNTER — Emergency Department (HOSPITAL_COMMUNITY): Payer: No Typology Code available for payment source

## 2020-02-12 ENCOUNTER — Encounter (HOSPITAL_COMMUNITY): Payer: Self-pay

## 2020-02-12 DIAGNOSIS — R519 Headache, unspecified: Secondary | ICD-10-CM | POA: Diagnosis not present

## 2020-02-12 DIAGNOSIS — Z87891 Personal history of nicotine dependence: Secondary | ICD-10-CM | POA: Insufficient documentation

## 2020-02-12 DIAGNOSIS — R55 Syncope and collapse: Secondary | ICD-10-CM | POA: Diagnosis not present

## 2020-02-12 DIAGNOSIS — R42 Dizziness and giddiness: Secondary | ICD-10-CM | POA: Insufficient documentation

## 2020-02-12 LAB — CBC
HCT: 34.7 % — ABNORMAL LOW (ref 36.0–46.0)
Hemoglobin: 11.5 g/dL — ABNORMAL LOW (ref 12.0–15.0)
MCH: 30.6 pg (ref 26.0–34.0)
MCHC: 33.1 g/dL (ref 30.0–36.0)
MCV: 92.3 fL (ref 80.0–100.0)
Platelets: 256 10*3/uL (ref 150–400)
RBC: 3.76 MIL/uL — ABNORMAL LOW (ref 3.87–5.11)
RDW: 13.2 % (ref 11.5–15.5)
WBC: 7.8 10*3/uL (ref 4.0–10.5)
nRBC: 0 % (ref 0.0–0.2)

## 2020-02-12 LAB — BASIC METABOLIC PANEL
Anion gap: 9 (ref 5–15)
BUN: 9 mg/dL (ref 6–20)
CO2: 22 mmol/L (ref 22–32)
Calcium: 8.8 mg/dL — ABNORMAL LOW (ref 8.9–10.3)
Chloride: 106 mmol/L (ref 98–111)
Creatinine, Ser: 0.71 mg/dL (ref 0.44–1.00)
GFR calc Af Amer: >60 mL/min (ref >60)
GFR calc non Af Amer: >60 mL/min (ref >60)
Glucose, Bld: 97 mg/dL (ref 70–99)
Potassium: 3.8 mmol/L (ref 3.5–5.1)
Sodium: 137 mmol/L (ref 135–145)

## 2020-02-12 LAB — CBG MONITORING, ED: Glucose-Capillary: 99 mg/dL (ref 70–99)

## 2020-02-12 LAB — I-STAT BETA HCG BLOOD, ED (MC, WL, AP ONLY): I-stat hCG, quantitative: <5 m[IU]/mL (ref <5)

## 2020-02-12 MED ORDER — SODIUM CHLORIDE 0.9 % IV SOLN: INTRAVENOUS | Status: DC

## 2020-02-12 MED ORDER — DIPHENHYDRAMINE HCL 50 MG/ML IJ SOLN
12.5000 mg | Freq: Once | INTRAMUSCULAR | Status: AC
  Administered 2020-02-12: 12.5 mg via INTRAVENOUS
  Filled 2020-02-12: qty 1

## 2020-02-12 MED ORDER — METOCLOPRAMIDE HCL 5 MG/ML IJ SOLN
10.0000 mg | Freq: Once | INTRAMUSCULAR | Status: AC
  Administered 2020-02-12: 10 mg via INTRAVENOUS
  Filled 2020-02-12: qty 2

## 2020-02-12 MED ORDER — SODIUM CHLORIDE 0.9 % IV BOLUS
1000.0000 mL | Freq: Once | INTRAVENOUS | Status: AC
  Administered 2020-02-12: 1000 mL via INTRAVENOUS

## 2020-02-12 MED ORDER — ONDANSETRON 4 MG PO TBDP: 4.0000 mg | ORAL_TABLET | Freq: Three times a day (TID) | ORAL | 0 refills | Status: DC | PRN

## 2020-02-12 NOTE — ED Provider Notes (Signed)
[facility] DEPT Provider Note   CSN: [id number] Arrival date & time: 02/12/20  2008     History Chief Complaint  Patient presents with  . Loss of Consciousness    Margaret [facility] is a 39 y.o. female.  Pt presents to the ED today with a headache.  It has lasted about 2 days.  She became dizzy about 1 hr ago and had a syncopal event pta.  Pt has a hx of migraines and chronic headaches.         Past Medical History:  Diagnosis Date  . Abnormal Pap smear of cervix    LEEP 2011  . Anemia    with pregnancy  . Chronic headaches   . Cyst near coccyx   . Hypoglycemia   . Migraine   . Pilonidal cyst   . Pyelonephritis    x 3 episodes  . Sickle cell trait [facility])     Patient Active Problem List   Diagnosis Date Noted  . Abdominal pain 08/18/2019  . Emesis 08/18/2019  . Hematochezia 08/18/2019  . Colitis 08/18/2019  . Cervical stenosis (uterine cervix) 09/27/2018  . Hematometra 09/27/2018  . Severe dysplasia of cervix (CIN III) 08/31/2017  . Hx of pyelonephritis 12/09/2012    Past Surgical History:  Procedure Laterality Date  . CERVICAL CONE BIOPSY    . CERVICAL CONIZATION W/BX  2019  . DILATION AND CURETTAGE OF UTERUS  2008  . DILATION AND CURETTAGE OF UTERUS N/A 09/27/2018   Procedure: DILATATION AND CURETTAGE;  Surgeon: Woodroe Mode, MD;  [facility]: [facility];  Service: Gynecology;  Laterality: N/A;  . LEEP  2011  . OTHER SURGICAL HISTORY     Pynlodial cyst removed from tail bone 2012  . OTHER SURGICAL HISTORY  10/2010   removal of cyst near coxxyx  . PILONIDAL CYST EXCISION  10/2010     OB History    Gravida  5   Para  2   Term  2   Preterm  0   AB  3   Living  2     SAB  2   TAB  1   Ectopic  0   Multiple  0   Live Births  2           Family History  Problem Relation Age of Onset  . Hypertension Brother   . Cancer Maternal Aunt        breast    Social History   Tobacco Use  . Smoking  status: Former Smoker    Packs/day: 0.10    Types: Cigarettes    Quit date: 07/14/2014    Years since quitting: 5.5  . Smokeless tobacco: Never Used  Vaping Use  . Vaping Use: Never used  Substance Use Topics  . Alcohol use: No  . Drug use: No    Home Medications Prior to Admission medications   Medication Sig Start Date End Date Taking? Authorizing Provider  Ascorbic Acid (VITAMIN C) 1000 MG tablet Take 1,000 mg by mouth daily.   Yes [provider]  ibuprofen (ADVIL) 200 MG tablet Take 600 mg by mouth every 6 (six) hours as needed for moderate pain.   Yes [provider]  oxymetazoline (AFRIN) 0.05 % nasal spray Place 2 sprays into both nostrils daily as needed for congestion.   Yes [provider]  polyethylene glycol (MIRALAX / GLYCOLAX) 17 g packet Take 17 g by mouth daily. 08/21/19  Yes Cordelia Poche  A, MD  sertraline (ZOLOFT) 50 MG tablet Take 50 mg by mouth daily. 12/11/19  Yes [provider]  etonogestrel (NEXPLANON) 68 MG IMPL implant 1 each by Subdermal route once. Patient not taking: Reported on 02/12/2020    [provider]  ondansetron (ZOFRAN ODT) 4 MG disintegrating tablet Take 1 tablet (4 mg total) by mouth every 8 (eight) hours as needed for nausea or vomiting. 02/12/20   Isla Pence, MD  oxyCODONE-acetaminophen (PERCOCET/ROXICET) 5-325 MG tablet Take 1-2 tablets by mouth every 6 (six) hours as needed. Patient not taking: Reported on 02/12/2020 09/27/18   Woodroe Mode, MD    Allergies    Codeine and Septra [bactrim]  Review of Systems   Review of Systems  Neurological: Positive for syncope, light-headedness and headaches.  All other systems reviewed and are negative.   Physical Exam Updated Vital Signs BP 115/64   Pulse 63   Temp 98.1 F (36.7 C) (Oral)   Resp 14   Ht 5\' 6"  (1.676 m)   Wt 95.3 kg   SpO2 99%   BMI 33.89 kg/m   Physical Exam Vitals and nursing note reviewed.  Constitutional:       Appearance: Normal appearance.  HENT:     Head: Normocephalic and atraumatic.     Right Ear: External ear normal.     Left Ear: External ear normal.     Nose: Nose normal.     Mouth/Throat:     Mouth: Mucous membranes are dry.  Eyes:     Extraocular Movements: Extraocular movements intact.     Conjunctiva/sclera: Conjunctivae normal.     Pupils: Pupils are equal, round, and reactive to light.  Cardiovascular:     Rate and Rhythm: Normal rate and regular rhythm.     Pulses: Normal pulses.     Heart sounds: Normal heart sounds.  Pulmonary:     Effort: Pulmonary effort is normal.     Breath sounds: Normal breath sounds.  Abdominal:     General: Abdomen is flat. Bowel sounds are normal.     Palpations: Abdomen is soft.  Musculoskeletal:        General: Normal range of motion.     Cervical back: Normal range of motion and neck supple.  Skin:    General: Skin is warm.     Capillary Refill: Capillary refill takes less than 2 seconds.  Neurological:     General: No focal deficit present.     Mental Status: She is alert and oriented to person, place, and time.     Comments: Pt takes a while to tell me what is going on, but she is able to articulate clearly.     ED Results / Procedures / Treatments   Labs (all labs ordered are listed, but only abnormal results are displayed) Labs Reviewed  BASIC METABOLIC PANEL - Abnormal; Notable for the following components:      Result Value   Calcium 8.8 (*)    All other components within normal limits  CBC - Abnormal; Notable for the following components:   RBC 3.76 (*)    Hemoglobin 11.5 (*)    HCT 34.7 (*)    All other components within normal limits  URINALYSIS, ROUTINE W REFLEX MICROSCOPIC  CBG MONITORING, ED  I-STAT BETA HCG BLOOD, ED ([facility], [facility], [facility] ONLY)    EKG EKG Interpretation  Date/Time:  Wednesday February 12 2020 20:24:49 EDT Ventricular Rate:  68 PR Interval:    QRS Duration: 89  QT Interval:  394 QTC  Calculation: 419 R Axis:   52 Text Interpretation: Sinus rhythm 12 Lead; Mason-Likar No significant change since last tracing Confirmed by Isla Pence [phone]) on 02/12/2020 9:17:22 PM   Radiology CT Head Wo Contrast  Result Date: 02/12/2020 CLINICAL DATA:  Severe headache for 2 days, dizziness, syncope EXAM: CT HEAD WITHOUT CONTRAST TECHNIQUE: Contiguous axial images were obtained from the base of the skull through the vertex without intravenous contrast. COMPARISON:  04/19/2005 FINDINGS: Brain: No acute infarct or hemorrhage. The lateral ventricles and midline structures are unremarkable. No acute extra-axial fluid collections. No mass effect. Vascular: No hyperdense vessel or unexpected calcification. Skull: Normal. Negative for fracture or focal lesion. Sinuses/Orbits: No acute finding. Other: None. IMPRESSION: 1. No acute intracranial process. Electronically Signed   By: Randa Ngo M.D.   On: 02/12/2020 21:39    Procedures Procedures (including critical care time)  Medications Ordered in ED Medications  sodium chloride 0.9 % bolus 1,000 mL (1,000 mLs Intravenous New Bag/Given 02/12/20 2110)    And  0.9 %  sodium chloride infusion (has no administration in time range)  metoCLOPramide (REGLAN) injection 10 mg (10 mg Intravenous Given 02/12/20 2110)  diphenhydrAMINE (BENADRYL) injection 12.5 mg (12.5 mg Intravenous Given 02/12/20 2110)    ED Course  I have reviewed the triage vital signs and the nursing notes.  Pertinent labs & imaging results that were available during my care of the patient were reviewed by me and considered in my medical decision making (see chart for details).    MDM Rules/Calculators/A&P                          Pt is feeling much better after migraine cocktail.  She is back to normal.  She wears a tight do-rag and is told to try to avoid tight things around the head.  She has never seen a neurologist for her migraines.  She is given a referral to neuro.  Pt  likely passed out because of lack of po intake due to nausea from the migraine.  Return if worse.   Final Clinical Impression(s) / ED Diagnoses Final diagnoses:  Acute nonintractable headache, unspecified headache type  Syncope, unspecified syncope type    Rx / DC Orders ED Discharge Orders         Ordered    ondansetron (ZOFRAN ODT) 4 MG disintegrating tablet  Every 8 hours PRN        02/12/20 2342    Ambulatory referral to Neurology       Comments: An appointment is requested in approximately: 1 month   02/12/20 2343           Isla Pence, MD 02/12/20 [phone]

## 2020-02-12 NOTE — ED Notes (Signed)
Pt provided w/labeled specimen cup for U/A collection. [employer]

## 2020-02-12 NOTE — ED Triage Notes (Addendum)
Pt reports severe headache x 2 days. Starting one hour ago around 1920 pt became dizzy and had a suspected syncopal episode. Pt struggling to explain situation.

## 2020-04-19 NOTE — Progress Notes (Deleted)
[facility]    Provider:  Dr Jaynee Eagles Requesting Provider: [facility], Isla Pence MD(ED) Primary Care Provider:  [facility]     CC:  headaches  HPI:  Margaret [facility] is a 39 y.o. female here as requested by the ED for headaches. PMHx anemia, sickle cell trait, migraines, pyelonephritis, acute gastroenteritis likely infections(hospitalized 07/2019).  I reviewed emergency room notes from February 12, 2020 where she presented to the emergency room with a headache that had been going on for 2 days, she became dizzy and had a syncopal episode, she has a history of migraines and chronic headaches, she reported syncope, lightheadedness and headaches, mucous membranes were dry, she was given Reglan, Benadryl, IV fluids in the emergency room with improvement of symptoms and syncopal episode was likely due to lack of p.o. intake due to nausea from the migraine, referred to neurology for her migraines.  Reviewed notes, labs and imaging from outside physicians, which showed ***  From a review of chart, medications that patient has tried that can be used in headache/migraine management includes: Tylenol, Fioricet, butorphanol injections, Flexeril, dexamethasone injections, Benadryl injections, Reglan injections and tablets, ibuprofen, Toradol injections, Skelaxin tablets, Zofran injections and tablets and ODT, prednisone tablets, Phenergan injections, Zoloft p.o., tramadol tablets.  CT head 02/12/2020 showed No acute intracranial abnormalities including mass lesion or mass effect, hydrocephalus, extra-axial fluid collection, midline shift, hemorrhage, or acute infarction, large ischemic events (personally reviewed images)  02/12/2020: cbc anemia hgb 11.5, bmp unremarkable  Review of Systems: Patient complains of symptoms per HPI as well as the following symptoms ***. Pertinent negatives and positives per HPI. All others negative.   Social History    Socioeconomic History  . Marital status: Married    Spouse name: Not on file  . Number of children: Not on file  . Years of education: Not on file  . Highest education level: Not on file  Occupational History  . Not on file  Tobacco Use  . Smoking status: Former Smoker    Packs/day: 0.10    Types: Cigarettes    Quit date: 07/14/2014    Years since quitting: 5.7  . Smokeless tobacco: Never Used  Vaping Use  . Vaping Use: Never used  Substance and Sexual Activity  . Alcohol use: No  . Drug use: No  . Sexual activity: Not Currently    Birth control/protection: Inserts  Other Topics Concern  . Not on file  Social History Narrative  . Not on file   Social Determinants of Health   Financial Resource Strain:   . Difficulty of Paying Living Expenses: Not on file  Food Insecurity:   . Worried About Charity fundraiser in the Last Year: Not on file  . Ran Out of Food in the Last Year: Not on file  Transportation Needs:   . Lack of Transportation (Medical): Not on file  . Lack of Transportation (Non-Medical): Not on file  Physical Activity:   . Days of Exercise per Week: Not on file  . Minutes of Exercise per Session: Not on file  Stress:   . Feeling of Stress : Not on file  Social Connections:   . Frequency of Communication with Friends and Family: Not on file  . Frequency of Social Gatherings with Friends and Family: Not on file  . Attends Religious Services: Not on file  . Active Member of Clubs or Organizations: Not on file  . Attends Archivist Meetings: Not on file  .  Marital Status: Not on file  Intimate Partner Violence:   . Fear of Current or Ex-Partner: Not on file  . Emotionally Abused: Not on file  . Physically Abused: Not on file  . Sexually Abused: Not on file    Family History  Problem Relation Age of Onset  . Hypertension Brother   . Cancer Maternal Aunt        breast    Past Medical History:  Diagnosis Date  . Abnormal Pap smear of  cervix    LEEP 2011  . Anemia    with pregnancy  . Chronic headaches   . Cyst near coccyx   . Hypoglycemia   . Migraine   . Pilonidal cyst   . Pyelonephritis    x 3 episodes  . Sickle cell trait [facility])     Patient Active Problem List   Diagnosis Date Noted  . Abdominal pain 08/18/2019  . Emesis 08/18/2019  . Hematochezia 08/18/2019  . Colitis 08/18/2019  . Cervical stenosis (uterine cervix) 09/27/2018  . Hematometra 09/27/2018  . Severe dysplasia of cervix (CIN III) 08/31/2017  . Hx of pyelonephritis 12/09/2012    Past Surgical History:  Procedure Laterality Date  . CERVICAL CONE BIOPSY    . CERVICAL CONIZATION W/BX  2019  . DILATION AND CURETTAGE OF UTERUS  2008  . DILATION AND CURETTAGE OF UTERUS N/A 09/27/2018   Procedure: DILATATION AND CURETTAGE;  Surgeon: Woodroe Mode, MD;  [facility];  Service: Gynecology;  Laterality: N/A;  . LEEP  2011  . OTHER SURGICAL HISTORY     Pynlodial cyst removed from tail bone 2012  . OTHER SURGICAL HISTORY  10/2010   removal of cyst near coxxyx  . PILONIDAL CYST EXCISION  10/2010    Current Outpatient Medications  Medication Sig Dispense Refill  . Ascorbic Acid (VITAMIN C) 1000 MG tablet Take 1,000 mg by mouth daily.    Marland Kitchen ibuprofen (ADVIL) 200 MG tablet Take 600 mg by mouth every 6 (six) hours as needed for moderate pain.    Marland Kitchen ondansetron (ZOFRAN ODT) 4 MG disintegrating tablet Take 1 tablet (4 mg total) by mouth every 8 (eight) hours as needed for nausea or vomiting. 20 tablet 0  . oxymetazoline (AFRIN) 0.05 % nasal spray Place 2 sprays into both nostrils daily as needed for congestion.    . polyethylene glycol (MIRALAX / GLYCOLAX) 17 g packet Take 17 g by mouth daily.    . sertraline (ZOLOFT) 50 MG tablet Take 50 mg by mouth daily.     No current facility-administered medications for this visit.    Allergies as of 04/20/2020 - Review Complete 02/12/2020  Allergen Reaction Noted  . Codeine Rash 04/13/2011  . Septra  [bactrim] Rash 04/13/2011    Vitals: There were no vitals taken for this visit. Last Weight:  Wt Readings from Last 1 Encounters:  02/12/20 210 lb (95.3 kg)   Last Height:   Ht Readings from Last 1 Encounters:  02/12/20 5\' 6"  (1.676 m)     Physical exam: Exam: Gen: NAD, conversant, well nourised, obese, well groomed                     CV: RRR, no MRG. No Carotid Bruits. No peripheral edema, warm, nontender Eyes: Conjunctivae clear without exudates or hemorrhage  Neuro: Detailed Neurologic Exam  Speech:    Speech is normal; fluent and spontaneous with normal comprehension.  Cognition:    The patient is oriented to  person, place, and time;     recent and remote memory intact;     language fluent;     normal attention, concentration,     fund of knowledge Cranial Nerves:    The pupils are equal, round, and reactive to light. The fundi are normal and spontaneous venous pulsations are present. Visual fields are full to finger confrontation. Extraocular movements are intact. Trigeminal sensation is intact and the muscles of mastication are normal. The face is symmetric. The palate elevates in the midline. Hearing intact. Voice is normal. Shoulder shrug is normal. The tongue has normal motion without fasciculations.   Coordination:    Normal finger to nose and heel to shin. Normal rapid alternating movements.   Gait:    Heel-toe and tandem gait are normal.   Motor Observation:    No asymmetry, no atrophy, and no involuntary movements noted. Tone:    Normal muscle tone.    Posture:    Posture is normal. normal erect    Strength:    Strength is V/V in the upper and lower limbs.      Sensation: intact to LT     Reflex Exam:  DTR's:    Deep tendon reflexes in the upper and lower extremities are normal bilaterally.   Toes:    The toes are downgoing bilaterally.   Clonus:    Clonus is absent.    Assessment/Plan:    No orders of the defined types were placed in  this encounter.  No orders of the defined types were placed in this encounter.   Cc: [facility],  Clinic, Christean Grief, MD  [facility] [address] [place], [place] [address]  Phone [phone] Fax [phone]

## 2020-04-20 ENCOUNTER — Ambulatory Visit: Payer: No Typology Code available for payment source | Admitting: Neurology

## 2020-04-20 ENCOUNTER — Telehealth: Payer: Self-pay | Admitting: Neurology

## 2020-04-20 ENCOUNTER — Encounter: Payer: Self-pay | Admitting: Neurology

## 2020-04-20 NOTE — Telephone Encounter (Signed)
Patient no-showed new patient appointment. If she calls to reschedule she can be seen by any physician here at [facility]. However, please explain our office policy that if she no-shows again or cancels within 24 hours of a new patient appointment again, she may be dismissed from our practice. thanks

## 2020-05-15 ENCOUNTER — Other Ambulatory Visit: Payer: Self-pay

## 2020-05-15 ENCOUNTER — Ambulatory Visit (HOSPITAL_COMMUNITY)
Admission: EM | Admit: 2020-05-15 | Discharge: 2020-05-15 | Disposition: A | Discharge status: Home / Self Care | Payer: No Typology Code available for payment source | Attending: Urgent Care | Admitting: Urgent Care

## 2020-05-15 ENCOUNTER — Encounter (HOSPITAL_COMMUNITY): Payer: Self-pay

## 2020-05-15 DIAGNOSIS — L03112 Cellulitis of left axilla: Secondary | ICD-10-CM | POA: Diagnosis not present

## 2020-05-15 MED ORDER — DOXYCYCLINE HYCLATE 100 MG PO CAPS: 100.0000 mg | ORAL_CAPSULE | Freq: Two times a day (BID) | ORAL | 0 refills | Status: DC

## 2020-05-15 MED ORDER — NAPROXEN 500 MG PO TABS: 500.0000 mg | ORAL_TABLET | Freq: Two times a day (BID) | ORAL | 0 refills | Status: DC

## 2020-05-15 NOTE — ED Provider Notes (Signed)
[place]   MRN: [id number] DOB: 1981/01/07  Subjective:   Margaret Cameron is a 39 y.o. female presenting for 1 week history of recurrent left axillary arm pain with concern for abscess.  Patient has been struggling with this for the past 3 months.  The infections have come and gone, has been drained once.  Another time the area has drained on its own.  She did go to a provider at the [place] and they did not do an I&D, advised that she come to urgent care.  Denies fever, spontaneous drainage, bleeding.  Denies having had a history of hidradenitis.  No current facility-administered medications for this encounter.  Current Outpatient Medications:    ibuprofen (ADVIL) 200 MG tablet, Take 600 mg by mouth every 6 (six) hours as needed for moderate pain., Disp: , Rfl:    sertraline (ZOLOFT) 50 MG tablet, Take 50 mg by mouth daily., Disp: , Rfl:    Ascorbic Acid (VITAMIN C) 1000 MG tablet, Take 1,000 mg by mouth daily., Disp: , Rfl:    ondansetron (ZOFRAN ODT) 4 MG disintegrating tablet, Take 1 tablet (4 mg total) by mouth every 8 (eight) hours as needed for nausea or vomiting., Disp: 20 tablet, Rfl: 0   oxymetazoline (AFRIN) 0.05 % nasal spray, Place 2 sprays into both nostrils daily as needed for congestion., Disp: , Rfl:    polyethylene glycol (MIRALAX / GLYCOLAX) 17 g packet, Take 17 g by mouth daily., Disp: , Rfl:    Allergies  Allergen Reactions   Codeine Rash   Septra [Bactrim] Rash    Past Medical History:  Diagnosis Date   Abnormal Pap smear of cervix    LEEP 2011   Anemia    with pregnancy   Chronic headaches    Cyst near coccyx    Hypoglycemia    Migraine    Pilonidal cyst    Pyelonephritis    x 3 episodes   Sickle cell trait ([place])      Past Surgical History:  Procedure Laterality Date   CERVICAL CONE BIOPSY     CERVICAL CONIZATION W/BX  2019   DILATION AND CURETTAGE OF UTERUS  2008   DILATION AND CURETTAGE OF UTERUS N/A  09/27/2018   Procedure: DILATATION AND CURETTAGE;  Surgeon: Woodroe Mode, MD;  Location: [facility];  Service: Gynecology;  Laterality: N/A;   LEEP  2011   OTHER SURGICAL HISTORY     Pynlodial cyst removed from tail bone 2012   OTHER SURGICAL HISTORY  10/2010   removal of cyst near coxxyx   PILONIDAL CYST EXCISION  10/2010    Family History  Problem Relation Age of Onset   Hypertension Brother    Cancer Maternal Aunt        breast    Social History   Tobacco Use   Smoking status: Former Smoker    Packs/day: 0.10    Types: Cigarettes    Quit date: 07/14/2014    Years since quitting: 5.8   Smokeless tobacco: Never Used  Vaping Use   Vaping Use: Never used  Substance Use Topics   Alcohol use: No   Drug use: No    ROS   Objective:   Vitals: BP 137/70 (BP Location: Left Arm)    Pulse 86    Temp 98.3 F (36.8 C) (Oral)    Resp 17    LMP 04/27/2020    SpO2 96%   Physical Exam Constitutional:  General: She is not in acute distress.    Appearance: Normal appearance. She is well-developed. She is not ill-appearing.  HENT:     Head: Normocephalic and atraumatic.     Nose: Nose normal.     Mouth/Throat:     Mouth: Mucous membranes are moist.     Pharynx: Oropharynx is clear.  Eyes:     General: No scleral icterus.    Extraocular Movements: Extraocular movements intact.     Pupils: Pupils are equal, round, and reactive to light.  Cardiovascular:     Rate and Rhythm: Normal rate.  Pulmonary:     Effort: Pulmonary effort is normal.  Skin:    General: Skin is warm and dry.       Neurological:     General: No focal deficit present.     Mental Status: She is alert and oriented to person, place, and time.  Psychiatric:        Mood and Affect: Mood normal.        Behavior: Behavior normal.      Assessment and Plan :   PDMP not reviewed this encounter.  1. Cellulitis of axilla, left     Will manage for mild cellulitis without abscess with  doxycycline, warm compresses and naproxen.  Recommended follow-up with her PCP, consideration for referral to dermatology for consultation regarding possible hidradenitis.  Counseled patient on potential for adverse effects with medications prescribed/recommended today, ER and return-to-clinic precautions discussed, patient verbalized understanding.    Jaynee Eagles, [place] 05/15/20 [phone]

## 2020-05-15 NOTE — ED Triage Notes (Signed)
Pt c/o abscess under left arm recurrent for approx 3 months. Pt states area will intermittently drain and improve. Inflammation increased over the past week, now draining slightly. Reports mild nausea. Was evaluated at [facility] yesterday and advised to come to urgent care for possible I & D.   Denies fever, v/d.   Took 400mg  motrin last night at approx midnight.

## 2020-05-15 NOTE — Discharge Instructions (Signed)
Talk with your PCP about a referral to dermatology for consultation on recurrent axillary infections versus hidradenitis. Use warm compresses 3-5 times daily, ~10 minutes each time. Change your dressing at least twice daily. If your wound worsen such as more pain, redness, more swelling, fevers then please return to our clinic as we may need to open your wound more or simply do a general recheck.

## 2020-07-07 ENCOUNTER — Encounter (HOSPITAL_COMMUNITY): Payer: Self-pay

## 2020-07-07 ENCOUNTER — Ambulatory Visit (HOSPITAL_COMMUNITY)
Admission: EM | Admit: 2020-07-07 | Discharge: 2020-07-07 | Disposition: A | Discharge status: Home / Self Care | Payer: No Typology Code available for payment source | Attending: Student | Admitting: Student

## 2020-07-07 ENCOUNTER — Other Ambulatory Visit: Payer: Self-pay

## 2020-07-07 DIAGNOSIS — M10079 Idiopathic gout, unspecified ankle and foot: Secondary | ICD-10-CM

## 2020-07-07 DIAGNOSIS — M79674 Pain in right toe(s): Secondary | ICD-10-CM | POA: Insufficient documentation

## 2020-07-07 DIAGNOSIS — Z885 Allergy status to narcotic agent status: Secondary | ICD-10-CM | POA: Insufficient documentation

## 2020-07-07 DIAGNOSIS — Z882 Allergy status to sulfonamides status: Secondary | ICD-10-CM | POA: Diagnosis not present

## 2020-07-07 DIAGNOSIS — Z7952 Long term (current) use of systemic steroids: Secondary | ICD-10-CM | POA: Diagnosis not present

## 2020-07-07 DIAGNOSIS — Z881 Allergy status to other antibiotic agents status: Secondary | ICD-10-CM | POA: Diagnosis not present

## 2020-07-07 DIAGNOSIS — Z87891 Personal history of nicotine dependence: Secondary | ICD-10-CM | POA: Insufficient documentation

## 2020-07-07 DIAGNOSIS — Z791 Long term (current) use of non-steroidal anti-inflammatories (NSAID): Secondary | ICD-10-CM | POA: Insufficient documentation

## 2020-07-07 LAB — URIC ACID: Uric Acid, Serum: 3.7 mg/dL (ref 2.5–7.1)

## 2020-07-07 MED ORDER — PREDNISONE 20 MG PO TABS: 40.0000 mg | ORAL_TABLET | Freq: Every day | ORAL | 0 refills | Status: AC

## 2020-07-07 MED ORDER — NAPROXEN 500 MG PO TABS: 500.0000 mg | ORAL_TABLET | Freq: Two times a day (BID) | ORAL | 0 refills | Status: DC

## 2020-07-07 NOTE — ED Provider Notes (Signed)
[facility]    CSN: [id number] Arrival date & time: 07/07/20  0806      History   Chief Complaint Chief Complaint  Patient presents with  . Toe Pain    HPI Margaret Cameron is a 40 y.o. female presenting with right great toe pain.  History anemia, abnormal Pap smear, headaches, pilonidal cyst, pyelonephritis, sickle cell trait.  States she has had right great toe pain since Saturday (3 days).  Denies injury.  Denies history of issues with this toe.  Denies sensation changes.  Is concerned about gout, due to location.  Denies history of gout in the past.  States that she likes beer.  HPI  Past Medical History:  Diagnosis Date  . Abnormal Pap smear of cervix    LEEP 2011  . Anemia    with pregnancy  . Chronic headaches   . Cyst near coccyx   . Hypoglycemia   . Migraine   . Pilonidal cyst   . Pyelonephritis    x 3 episodes  . Sickle cell trait [facility])     Patient Active Problem List   Diagnosis Date Noted  . Abdominal pain 08/18/2019  . Emesis 08/18/2019  . Hematochezia 08/18/2019  . Colitis 08/18/2019  . Cervical stenosis (uterine cervix) 09/27/2018  . Hematometra 09/27/2018  . Severe dysplasia of cervix (CIN III) 08/31/2017  . Hx of pyelonephritis 12/09/2012    Past Surgical History:  Procedure Laterality Date  . CERVICAL CONE BIOPSY    . CERVICAL CONIZATION W/BX  2019  . DILATION AND CURETTAGE OF UTERUS  2008  . DILATION AND CURETTAGE OF UTERUS N/A 09/27/2018   Procedure: DILATATION AND CURETTAGE;  Surgeon: Woodroe Mode, MD;  Location: [facility];  Service: Gynecology;  Laterality: N/A;  . LEEP  2011  . OTHER SURGICAL HISTORY     Pynlodial cyst removed from tail bone 2012  . OTHER SURGICAL HISTORY  10/2010   removal of cyst near coxxyx  . PILONIDAL CYST EXCISION  10/2010    OB History    Gravida  5   Para  2   Term  2   Preterm  0   AB  3   Living  2     SAB  2   IAB  1   Ectopic  0   Multiple  0   Live Births  2             Home Medications    Prior to Admission medications   Medication Sig Start Date End Date Taking? Authorizing Provider  naproxen (NAPROSYN) 500 MG tablet Take 1 tablet (500 mg total) by mouth 2 (two) times daily. 07/07/20  Yes Hazel Sams, PA-C  predniSONE (DELTASONE) 20 MG tablet Take 2 tablets (40 mg total) by mouth daily for 5 days. 07/07/20 07/12/20 Yes Hazel Sams, PA-C  Ascorbic Acid (VITAMIN C) 1000 MG tablet Take 1,000 mg by mouth daily.    [provider]  ibuprofen (ADVIL) 200 MG tablet Take 600 mg by mouth every 6 (six) hours as needed for moderate pain.    [provider]  ondansetron (ZOFRAN ODT) 4 MG disintegrating tablet Take 1 tablet (4 mg total) by mouth every 8 (eight) hours as needed for nausea or vomiting. 02/12/20   Isla Pence, MD  oxymetazoline (AFRIN) 0.05 % nasal spray Place 2 sprays into both nostrils daily as needed for congestion.    [provider]  polyethylene glycol (MIRALAX / GLYCOLAX)  17 g packet Take 17 g by mouth daily. 08/21/19   Mariel Aloe, MD  sertraline (ZOLOFT) 50 MG tablet Take 50 mg by mouth daily. 12/11/19   [provider]  etonogestrel (NEXPLANON) 68 MG IMPL implant 1 each by Subdermal route once. Patient not taking: Reported on 02/12/2020  02/13/20  [provider]    Family History Family History  Problem Relation Age of Onset  . Hypertension Brother   . Cancer Maternal Aunt        breast    Social History Social History   Tobacco Use  . Smoking status: Former Smoker    Packs/day: 0.10    Types: Cigarettes    Quit date: 07/14/2014    Years since quitting: 5.9  . Smokeless tobacco: Never Used  Vaping Use  . Vaping Use: Never used  Substance Use Topics  . Alcohol use: No  . Drug use: No     Allergies   Codeine and Septra [bactrim]   Review of Systems Review of Systems  Musculoskeletal:       Right great toe pain   All other systems reviewed and are  negative.    Physical Exam Triage Vital Signs ED Triage Vitals  Enc Vitals Group     BP 07/07/20 0823 121/79     Pulse Rate 07/07/20 0823 69     Resp 07/07/20 0823 17     Temp 07/07/20 0823 98.6 F (37 C)     Temp Source 07/07/20 0823 Oral     SpO2 07/07/20 0823 97 %     Weight --      Height --      Head Circumference --      Peak Flow --      Pain Score 07/07/20 0821 9     Pain Loc --      Pain Edu? --      Excl. in [place]? --    No data found.  Updated Vital Signs BP 121/79 (BP Location: Left Arm)   Pulse 69   Temp 98.6 F (37 C) (Oral)   Resp 17   LMP 06/17/2020   SpO2 97%   Visual Acuity Right Eye Distance:   Left Eye Distance:   Bilateral Distance:    Right Eye Near:   Left Eye Near:    Bilateral Near:     Physical Exam Vitals reviewed.  Constitutional:      Appearance: Normal appearance.  Cardiovascular:     Rate and Rhythm: Normal rate and regular rhythm.     Heart sounds: Normal heart sounds.  Pulmonary:     Effort: Pulmonary effort is normal.     Breath sounds: Normal breath sounds.  Musculoskeletal:     Comments: Right great toe with mild swelling overlying MTP joint. Very tender to palpation MTP joint. Sensation intact, neurovascularly intact, ROM intact but with pain. Cap refill <2 seconds.   Neurological:     General: No focal deficit present.     Mental Status: She is alert and oriented to person, place, and time.  Psychiatric:        Mood and Affect: Mood normal.        Behavior: Behavior normal.        Thought Content: Thought content normal.        Judgment: Judgment normal.      UC Treatments / Results  Labs (all labs ordered are listed, but only abnormal results are displayed) Labs Reviewed  URIC  ACID    EKG   Radiology No results found.  Procedures Procedures (including critical care time)  Medications Ordered in UC Medications - No data to display  Initial Impression / Assessment and Plan / UC Course  I have  reviewed the triage vital signs and the nursing notes.  Pertinent labs & imaging results that were available during my care of the patient were reviewed by me and considered in my medical decision making (see chart for details).      For gout-prednisone and naproxen as below.  She is not a diabetic.  Also discussed low purine diet.  Spent over 30 minutes obtaining H&P, performing physical, discussing results, treatment plan and plan for follow-up with patient. Patient agrees with plan.   This chart was dictated using voice recognition software, Dragon. Despite the best efforts of this provider to proofread and correct errors, errors may still occur which can change documentation meaning.  Final Clinical Impressions(s) / UC Diagnoses   Final diagnoses:  None     Discharge Instructions     -For your gout please start these medications: -Take prednisone 2 pills a day for 5 days.  You can take both pills together in the morning. -Also start naproxen, 2 pills daily.  You should take 1 pill with breakfast and 1 pill with dinner.  Continue this for about 5 days.  This should help with pain and inflammation. -Also reduce consumption of beer. -Come back and see [place] if your symptoms worsen or persist despite treatment.    ED Prescriptions    Medication Sig Dispense Auth. Provider   naproxen (NAPROSYN) 500 MG tablet Take 1 tablet (500 mg total) by mouth 2 (two) times daily. 30 tablet Hazel Sams, PA-C   predniSONE (DELTASONE) 20 MG tablet Take 2 tablets (40 mg total) by mouth daily for 5 days. 10 tablet Hazel Sams, PA-C     PDMP not reviewed this encounter.   Hazel Sams, PA-C 07/07/20 [phone]

## 2020-07-07 NOTE — Discharge Instructions (Signed)
-  For your gout please start these medications: -Take prednisone 2 pills a day for 5 days.  You can take both pills together in the morning. -Also start naproxen, 2 pills daily.  You should take 1 pill with breakfast and 1 pill with dinner.  Continue this for about 5 days.  This should help with pain and inflammation. -Also reduce consumption of beer. -Come back and see [place] if your symptoms worsen or persist despite treatment.

## 2020-07-07 NOTE — ED Triage Notes (Signed)
Pt presents with right great toe pain since Saturday that is non injury related.

## 2021-01-19 ENCOUNTER — Emergency Department (HOSPITAL_COMMUNITY)
Admission: EM | Admit: 2021-01-19 | Discharge: 2021-01-19 | Disposition: A | Discharge status: Home / Self Care | Payer: No Typology Code available for payment source | Attending: Emergency Medicine | Admitting: Emergency Medicine

## 2021-01-19 ENCOUNTER — Emergency Department (HOSPITAL_COMMUNITY): Payer: No Typology Code available for payment source

## 2021-01-19 ENCOUNTER — Other Ambulatory Visit: Payer: Self-pay

## 2021-01-19 ENCOUNTER — Encounter (HOSPITAL_COMMUNITY): Payer: Self-pay | Admitting: *Deleted

## 2021-01-19 DIAGNOSIS — J069 Acute upper respiratory infection, unspecified: Secondary | ICD-10-CM | POA: Diagnosis not present

## 2021-01-19 DIAGNOSIS — R0602 Shortness of breath: Secondary | ICD-10-CM | POA: Diagnosis present

## 2021-01-19 DIAGNOSIS — R059 Cough, unspecified: Secondary | ICD-10-CM

## 2021-01-19 DIAGNOSIS — Z87891 Personal history of nicotine dependence: Secondary | ICD-10-CM | POA: Insufficient documentation

## 2021-01-19 DIAGNOSIS — Z20822 Contact with and (suspected) exposure to covid-19: Secondary | ICD-10-CM | POA: Diagnosis not present

## 2021-01-19 LAB — RESP PANEL BY RT-PCR (FLU A&B, COVID) ARPGX2
Influenza A by PCR: NEGATIVE
Influenza B by PCR: NEGATIVE
SARS Coronavirus 2 by RT PCR: NEGATIVE

## 2021-01-19 NOTE — ED Triage Notes (Signed)
Pt complains of cough, sob. States coworkers have tested positive for [place], employer wanted her tested.

## 2021-01-19 NOTE — Discharge Instructions (Addendum)
Return for any problem.  COVID and flu testing performed today was negative for both.

## 2021-01-19 NOTE — ED Provider Notes (Signed)
[facility] DEPT Provider Note   CSN: [id number] Arrival date & time: 01/19/21  1227     History Chief Complaint  Patient presents with   Cough   Shortness of Breath    Margaret Cameron is a 40 y.o. female.  40 year old female with prior medical history as detailed below presents for evaluation.  Patient with 2 to 3 days of mild cough and congestion.  Patient reports that her work is requesting that she be evaluated for possible COVID.  She denies fever.  She denies shortness of breath.  She is otherwise without complaint.  The history is provided by the patient.  Cough Cough characteristics:  Dry Sputum characteristics:  Nondescript Severity:  Mild Onset quality:  Gradual Duration:  2 days Timing:  Constant Progression:  Unchanged Chronicity:  New Associated symptoms: shortness of breath   Shortness of Breath Associated symptoms: cough       Past Medical History:  Diagnosis Date   Abnormal Pap smear of cervix    LEEP 2011   Anemia    with pregnancy   Chronic headaches    Cyst near coccyx    Hypoglycemia    Migraine    Pilonidal cyst    Pyelonephritis    x 3 episodes   Sickle cell trait [facility])     Patient Active Problem List   Diagnosis Date Noted   Abdominal pain 08/18/2019   Emesis 08/18/2019   Hematochezia 08/18/2019   Colitis 08/18/2019   Cervical stenosis (uterine cervix) 09/27/2018   Hematometra 09/27/2018   Severe dysplasia of cervix (CIN III) 08/31/2017   Hx of pyelonephritis 12/09/2012    Past Surgical History:  Procedure Laterality Date   CERVICAL CONE BIOPSY     CERVICAL CONIZATION W/BX  2019   DILATION AND CURETTAGE OF UTERUS  2008   DILATION AND CURETTAGE OF UTERUS N/A 09/27/2018   Procedure: DILATATION AND CURETTAGE;  Surgeon: Woodroe Mode, MD;  Location: [facility];  Service: Gynecology;  Laterality: N/A;   LEEP  2011   OTHER SURGICAL HISTORY     Pynlodial cyst removed from tail bone 2012    OTHER SURGICAL HISTORY  10/2010   removal of cyst near coxxyx   PILONIDAL CYST EXCISION  10/2010     OB History     Gravida  5   Para  2   Term  2   Preterm  0   AB  3   Living  2      SAB  2   IAB  1   Ectopic  0   Multiple  0   Live Births  2           Family History  Problem Relation Age of Onset   Hypertension Brother    Cancer Maternal Aunt        breast    Social History   Tobacco Use   Smoking status: Former    Packs/day: 0.10    Types: Cigarettes    Quit date: 07/14/2014    Years since quitting: 6.5   Smokeless tobacco: Never  Vaping Use   Vaping Use: Never used  Substance Use Topics   Alcohol use: No   Drug use: No    Home Medications Prior to Admission medications   Medication Sig Start Date End Date Taking? Authorizing Provider  Ascorbic Acid (VITAMIN C) 1000 MG tablet Take 1,000 mg by mouth daily.    [provider]  ibuprofen (ADVIL)  200 MG tablet Take 600 mg by mouth every 6 (six) hours as needed for moderate pain.    [provider]  naproxen (NAPROSYN) 500 MG tablet Take 1 tablet (500 mg total) by mouth 2 (two) times daily. 07/07/20   Hazel Sams, PA-C  ondansetron (ZOFRAN ODT) 4 MG disintegrating tablet Take 1 tablet (4 mg total) by mouth every 8 (eight) hours as needed for nausea or vomiting. 02/12/20   Isla Pence, MD  oxymetazoline (AFRIN) 0.05 % nasal spray Place 2 sprays into both nostrils daily as needed for congestion.    [provider]  polyethylene glycol (MIRALAX / GLYCOLAX) 17 g packet Take 17 g by mouth daily. 08/21/19   Mariel Aloe, MD  sertraline (ZOLOFT) 50 MG tablet Take 50 mg by mouth daily. 12/11/19   [provider]  etonogestrel (NEXPLANON) 68 MG IMPL implant 1 each by Subdermal route once. Patient not taking: Reported on 02/12/2020  02/13/20  [provider]    Allergies    Codeine and Septra [bactrim]  Review of Systems   Review of Systems  Respiratory:   Positive for cough and shortness of breath.   All other systems reviewed and are negative.  Physical Exam Updated Vital Signs BP 132/86   Pulse 92   Temp 97.6 F (36.4 C) (Oral)   Resp 18   LMP 01/15/2021   SpO2 100%   Physical Exam Vitals and nursing note reviewed.  Constitutional:      General: She is not in acute distress.    Appearance: Normal appearance. She is well-developed.  HENT:     Head: Normocephalic and atraumatic.  Eyes:     Conjunctiva/sclera: Conjunctivae normal.     Pupils: Pupils are equal, round, and reactive to light.  Cardiovascular:     Rate and Rhythm: Normal rate and regular rhythm.     Heart sounds: Normal heart sounds.  Pulmonary:     Effort: Pulmonary effort is normal. No respiratory distress.     Breath sounds: Normal breath sounds.  Abdominal:     General: There is no distension.     Palpations: Abdomen is soft.     Tenderness: There is no abdominal tenderness.  Musculoskeletal:        General: No deformity. Normal range of motion.     Cervical back: Normal range of motion and neck supple.  Skin:    General: Skin is warm and dry.  Neurological:     General: No focal deficit present.     Mental Status: She is alert and oriented to person, place, and time.    ED Results / Procedures / Treatments   Labs (all labs ordered are listed, but only abnormal results are displayed) Labs Reviewed  RESP PANEL BY RT-PCR (FLU A&B, COVID) [id number]    EKG None  Radiology DG Chest 2 View  Result Date: 01/19/2021 CLINICAL DATA:  Cough, shortness of breath. EXAM: CHEST - 2 VIEW COMPARISON:  August 28, 2017. FINDINGS: The heart size and mediastinal contours are within normal limits. Both lungs are clear. The visualized skeletal structures are unremarkable. IMPRESSION: No active cardiopulmonary disease. Electronically Signed   By: Marijo Conception M.D.   On: 01/19/2021 15:04    Procedures Procedures   Medications Ordered in ED Medications - No data to  display  ED Course  I have reviewed the triage vital signs and the nursing notes.  Pertinent labs & imaging results that were available during my care of  the patient were reviewed by me and considered in my medical decision making (see chart for details).    MDM Rules/Calculators/A&P                           MDM  MSE complete  Ron Paycen Tech was evaluated in Emergency Department on 01/19/2021 for the symptoms described in the history of present illness. She was evaluated in the context of the global COVID-19 pandemic, which necessitated consideration that the patient might be at risk for infection with the SARS-CoV-2 virus that causes COVID-19. Institutional protocols and algorithms that pertain to the evaluation of patients at risk for COVID-19 are in a state of rapid change based on information released by regulatory bodies including the CDC and federal and state organizations. These policies and algorithms were followed during the patient's care in the ED.   Patient is requesting COVID screen.  Patient with URI symptoms.  COVID and flu testing are negative.  Imaging obtained is without significant acute pathology  Patient does understand need for close follow-up.  Strict return precautions given and understood.   Final Clinical Impression(s) / ED Diagnoses Final diagnoses:  Upper respiratory tract infection, unspecified type    Rx / DC Orders ED Discharge Orders     None        Valarie Merino, MD 01/19/21 [phone]

## 2021-01-19 NOTE — ED Provider Notes (Signed)
Emergency Medicine Provider Triage Evaluation Note  Margaret Cameron , a 40 y.o. female  was evaluated in triage.  Pt complains of cough, shortness of breath, headache, vomiting. She had a COVID exposure at work and was sent to be tested. She denies chest pain, vision changes. She reports only other medical history is sickle cell trait, history of migraines.   Review of Systems  Positive: Cough, shortness of breath Negative: Chest pain, vision changes  Physical Exam  BP 132/86   Pulse 92   Temp 97.6 F (36.4 C) (Oral)   Resp 18   LMP 01/15/2021   SpO2 100%  Gen:   Awake, some distress, breathing fast Resp:  Breathing with some effort, lungs sound CTA, poor excursion due to effort MSK:   Moves extremities without difficulty Other:    Medical Decision Making  Medically screening exam initiated at 12:43 PM.  Appropriate orders placed.  Margaret Cameron was informed that the remainder of the evaluation will be completed by another provider, this initial triage assessment does not replace that evaluation, and the importance of remaining in the ED until their evaluation is complete.  Cough, sob, probably COVID exposure   Dorien Chihuahua 01/19/21 1246    Valarie Merino, MD 01/20/21 2238

## 2021-02-05 ENCOUNTER — Inpatient Hospital Stay (HOSPITAL_COMMUNITY)
Admission: EM | Admit: 2021-02-05 | Discharge: 2021-02-07 | DRG: 759 | Disposition: A | Discharge status: Home / Self Care | Payer: No Typology Code available for payment source | Attending: Obstetrics and Gynecology | Admitting: Obstetrics and Gynecology

## 2021-02-05 ENCOUNTER — Emergency Department (HOSPITAL_COMMUNITY): Payer: No Typology Code available for payment source

## 2021-02-05 ENCOUNTER — Other Ambulatory Visit: Payer: Self-pay | Admitting: Obstetrics and Gynecology

## 2021-02-05 DIAGNOSIS — D259 Leiomyoma of uterus, unspecified: Secondary | ICD-10-CM | POA: Diagnosis present

## 2021-02-05 DIAGNOSIS — N7093 Salpingitis and oophoritis, unspecified: Secondary | ICD-10-CM | POA: Diagnosis not present

## 2021-02-05 DIAGNOSIS — Z20822 Contact with and (suspected) exposure to covid-19: Secondary | ICD-10-CM | POA: Diagnosis present

## 2021-02-05 DIAGNOSIS — K760 Fatty (change of) liver, not elsewhere classified: Secondary | ICD-10-CM | POA: Diagnosis present

## 2021-02-05 DIAGNOSIS — R109 Unspecified abdominal pain: Secondary | ICD-10-CM | POA: Diagnosis not present

## 2021-02-05 DIAGNOSIS — Z87891 Personal history of nicotine dependence: Secondary | ICD-10-CM

## 2021-02-05 DIAGNOSIS — R102 Pelvic and perineal pain: Secondary | ICD-10-CM | POA: Diagnosis present

## 2021-02-05 DIAGNOSIS — N7091 Salpingitis, unspecified: Secondary | ICD-10-CM | POA: Diagnosis present

## 2021-02-05 DIAGNOSIS — Z23 Encounter for immunization: Secondary | ICD-10-CM

## 2021-02-05 DIAGNOSIS — Z86001 Personal history of in-situ neoplasm of cervix uteri: Secondary | ICD-10-CM

## 2021-02-05 LAB — CBC WITH DIFFERENTIAL/PLATELET
Abs Immature Granulocytes: 0.05 10*3/uL (ref 0.00–0.07)
Basophils Absolute: 0.1 10*3/uL (ref 0.0–0.1)
Basophils Relative: 0 %
Eosinophils Absolute: 0 10*3/uL (ref 0.0–0.5)
Eosinophils Relative: 0 %
HCT: 37.1 % (ref 36.0–46.0)
Hemoglobin: 13 g/dL (ref 12.0–15.0)
Immature Granulocytes: 0 %
Lymphocytes Relative: 15 %
Lymphs Abs: 2.2 10*3/uL (ref 0.7–4.0)
MCH: 30.5 pg (ref 26.0–34.0)
MCHC: 35 g/dL (ref 30.0–36.0)
MCV: 87.1 fL (ref 80.0–100.0)
Monocytes Absolute: 0.6 10*3/uL (ref 0.1–1.0)
Monocytes Relative: 4 %
Neutro Abs: 11.8 10*3/uL — ABNORMAL HIGH (ref 1.7–7.7)
Neutrophils Relative %: 81 %
Platelets: 259 10*3/uL (ref 150–400)
RBC: 4.26 MIL/uL (ref 3.87–5.11)
RDW: 13.2 % (ref 11.5–15.5)
WBC: 14.7 10*3/uL — ABNORMAL HIGH (ref 4.0–10.5)
nRBC: 0 % (ref 0.0–0.2)

## 2021-02-05 LAB — COMPREHENSIVE METABOLIC PANEL
ALT: 11 U/L (ref 0–44)
AST: 16 U/L (ref 15–41)
Albumin: 4.4 g/dL (ref 3.5–5.0)
Alkaline Phosphatase: 49 U/L (ref 38–126)
Anion gap: 9 (ref 5–15)
BUN: 9 mg/dL (ref 6–20)
CO2: 20 mmol/L — ABNORMAL LOW (ref 22–32)
Calcium: 9.5 mg/dL (ref 8.9–10.3)
Chloride: 108 mmol/L (ref 98–111)
Creatinine, Ser: 0.77 mg/dL (ref 0.44–1.00)
GFR, Estimated: >60 mL/min (ref >60)
Glucose, Bld: 86 mg/dL (ref 70–99)
Potassium: 3.6 mmol/L (ref 3.5–5.1)
Sodium: 137 mmol/L (ref 135–145)
Total Bilirubin: 0.9 mg/dL (ref 0.3–1.2)
Total Protein: 7.3 g/dL (ref 6.5–8.1)

## 2021-02-05 LAB — I-STAT BETA HCG BLOOD, ED (MC, WL, AP ONLY): I-stat hCG, quantitative: <5 m[IU]/mL (ref <5)

## 2021-02-05 LAB — LIPASE, BLOOD: Lipase: 25 U/L (ref 11–51)

## 2021-02-05 MED ORDER — SODIUM CHLORIDE 0.9 % IV SOLN
2.0000 g | Freq: Four times a day (QID) | INTRAVENOUS | Status: DC
  Administered 2021-02-06: 2 g via INTRAVENOUS
  Filled 2021-02-05 (×3): qty 2

## 2021-02-05 MED ORDER — MORPHINE SULFATE (PF) 4 MG/ML IV SOLN
4.0000 mg | Freq: Once | INTRAVENOUS | Status: AC
  Administered 2021-02-05: 4 mg via INTRAVENOUS
  Filled 2021-02-05: qty 1

## 2021-02-05 MED ORDER — DOXYCYCLINE HYCLATE 100 MG PO TABS
100.0000 mg | ORAL_TABLET | Freq: Two times a day (BID) | ORAL | Status: DC
  Administered 2021-02-05: 100 mg via ORAL
  Filled 2021-02-05: qty 1

## 2021-02-05 MED ORDER — METRONIDAZOLE 500 MG/100ML IV SOLN
500.0000 mg | Freq: Two times a day (BID) | INTRAVENOUS | Status: DC
  Administered 2021-02-05: 500 mg via INTRAVENOUS
  Filled 2021-02-05: qty 100

## 2021-02-05 MED ORDER — MORPHINE SULFATE (PF) 4 MG/ML IV SOLN
4.0000 mg | Freq: Once | INTRAVENOUS | Status: AC
Start: 2021-02-05 — End: 2021-02-05
  Administered 2021-02-05: 4 mg via INTRAVENOUS
  Filled 2021-02-05: qty 1

## 2021-02-05 MED ORDER — IOHEXOL 350 MG/ML SOLN
80.0000 mL | Freq: Once | INTRAVENOUS | Status: AC | PRN
  Administered 2021-02-05: 80 mL via INTRAVENOUS

## 2021-02-05 NOTE — ED Notes (Signed)
Ultrasound at bedside

## 2021-02-05 NOTE — ED Provider Notes (Addendum)
[facility] DEPT Provider Note   CSN: [id number] Arrival date & time: 02/05/21  1706     History Chief Complaint  Patient presents with   Abdominal Pain   Vaginal Bleeding    Margaret Cameron is a 40 y.o. 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  Patient states that the pain began at 11 this morning and has been increasing in intensity.  She endorses spotting   Abdominal Pain Associated symptoms: vaginal bleeding   Vaginal Bleeding Associated symptoms: abdominal pain       Past Medical History:  Diagnosis Date   Abnormal Pap smear of cervix    LEEP 2011   Anemia    with pregnancy   Chronic headaches    Cyst near coccyx    Hypoglycemia    Migraine    Pilonidal cyst    Pyelonephritis    x 3 episodes   Sickle cell trait [facility])     Patient Active Problem List   Diagnosis Date Noted   Abdominal pain 08/18/2019   Emesis 08/18/2019   Hematochezia 08/18/2019   Colitis 08/18/2019   Cervical stenosis (uterine cervix) 09/27/2018   Hematometra 09/27/2018   Severe dysplasia of cervix (CIN III) 08/31/2017   Hx of pyelonephritis 12/09/2012    Past Surgical History:  Procedure Laterality Date   CERVICAL CONE BIOPSY     CERVICAL CONIZATION W/BX  2019   DILATION AND CURETTAGE OF UTERUS  2008   DILATION AND CURETTAGE OF UTERUS N/A 09/27/2018   Procedure: DILATATION AND CURETTAGE;  Surgeon: Woodroe Mode, MD;  Location: [facility];  Service: Gynecology;  Laterality: N/A;   LEEP  2011   OTHER SURGICAL HISTORY     Pynlodial cyst removed from tail bone 2012   OTHER SURGICAL HISTORY  10/2010   removal of cyst near coxxyx   PILONIDAL CYST EXCISION  10/2010     OB History     Gravida  5   Para  2   Term  2   Preterm  0   AB  3   Living  2      SAB  2   IAB  1   Ectopic  0    Multiple  0   Live Births  2           Family History  Problem Relation Age of Onset   Hypertension Brother    Cancer Maternal Aunt        breast    Social History   Tobacco Use   Smoking status: Former    Packs/day: 0.10    Types: Cigarettes    Quit date: 07/14/2014    Years since quitting: 6.5   Smokeless tobacco: Never  Vaping Use   Vaping Use: Never used  Substance Use Topics   Alcohol use: No   Drug use: No    Home Medications Prior to Admission medications   Medication Sig Start Date End Date Taking? Authorizing Provider  Ascorbic Acid (VITAMIN C) 1000 MG tablet Take 1,000 mg by mouth daily.    [provider]  ibuprofen (ADVIL) 200 MG tablet Take 600 mg by mouth every 6 (six) hours as needed for moderate pain.    [provider]  naproxen (NAPROSYN) 500 MG tablet Take 1 tablet (500 mg total) by mouth 2 (two) times daily. 07/07/20  Hazel Sams, PA-C  ondansetron (ZOFRAN ODT) 4 MG disintegrating tablet Take 1 tablet (4 mg total) by mouth every 8 (eight) hours as needed for nausea or vomiting. 02/12/20   Isla Pence, MD  oxymetazoline (AFRIN) 0.05 % nasal spray Place 2 sprays into both nostrils daily as needed for congestion.    [provider]  polyethylene glycol (MIRALAX / GLYCOLAX) 17 g packet Take 17 g by mouth daily. 08/21/19   Mariel Aloe, MD  sertraline (ZOLOFT) 50 MG tablet Take 50 mg by mouth daily. 12/11/19   [provider]  etonogestrel (NEXPLANON) 68 MG IMPL implant 1 each by Subdermal route once. Patient not taking: Reported on 02/12/2020  02/13/20  [provider]    Allergies    Codeine and Septra [bactrim]  Review of Systems   Review of Systems  Gastrointestinal:  Positive for abdominal pain.  Genitourinary:  Positive for vaginal bleeding.   Physical Exam Updated Vital Signs BP 114/74   Pulse 73   Temp 98.3 F (36.8 C) (Oral)   Resp (!) 22   LMP 01/15/2021   SpO2 100%   Physical  Exam Vitals and nursing note reviewed.  Constitutional:      General: She is not in acute distress.    Appearance: She is well-developed. She is ill-appearing.  HENT:     Head: Normocephalic and atraumatic.  Eyes:     Conjunctiva/sclera: Conjunctivae normal.  Cardiovascular:     Rate and Rhythm: Normal rate and regular rhythm.     Heart sounds: No murmur heard. Pulmonary:     Effort: Pulmonary effort is normal. No respiratory distress.     Breath sounds: Normal breath sounds.  Abdominal:     Palpations: Abdomen is soft.     Tenderness: There is abdominal tenderness in the right lower quadrant, suprapubic area and left lower quadrant. There is guarding.  Musculoskeletal:     Cervical back: Neck supple.  Skin:    General: Skin is warm and dry.  Neurological:     Mental Status: She is alert.    ED Results / Procedures / Treatments   Labs (all labs ordered are listed, but only abnormal results are displayed) Labs Reviewed  CBC WITH DIFFERENTIAL/PLATELET - Abnormal; Notable for the following components:      Result Value   WBC 14.7 (*)    Neutro Abs 11.8 (*)    All other components within normal limits  COMPREHENSIVE METABOLIC PANEL - Abnormal; Notable for the following components:   CO2 20 (*)    All other components within normal limits  LIPASE, BLOOD  URINALYSIS, ROUTINE W REFLEX MICROSCOPIC  I-STAT BETA HCG BLOOD, ED ([facility], [facility], [facility] ONLY)  GC/CHLAMYDIA PROBE AMP ([place]) NOT AT [facility]    EKG None  Radiology US Transvaginal Non-OB  Result Date: 02/05/2021 CLINICAL DATA:  Pelvic pain EXAM: TRANSABDOMINAL AND TRANSVAGINAL ULTRASOUND OF PELVIS DOPPLER ULTRASOUND OF OVARIES TECHNIQUE: Both transabdominal and transvaginal ultrasound examinations of the pelvis were performed. Transabdominal technique was performed for global imaging of the pelvis including uterus, ovaries, adnexal regions, and pelvic cul-de-sac. It was necessary to proceed with endovaginal exam following the  transabdominal exam to visualize the uterus endometrium ovaries. Color and duplex Doppler ultrasound was utilized to evaluate blood flow to the ovaries. COMPARISON:  CT 07/28/2018 FINDINGS: Uterus Measurements: 8.2 x 5.4 x 6.9 cm = volume: 159.1 mL. Left posterior uterine fibroid measuring 3.7 x 2.5 x 3.8 cm. Endometrium Thickness: 6 mm.  No  focal abnormality visualized. Right ovary Measurements: 3.2 x 2.2 x 2.1 cm = volume: 7.6 mL. Normal appearance/no adnexal mass. Left ovary Measurements: 3 x 1.2 x 2.1 cm = volume: 3.9 mL. Normal appearance/no adnexal mass. Pulsed Doppler evaluation of both ovaries demonstrates normal low-resistance arterial and venous waveforms. Other findings No abnormal free fluid. IMPRESSION: 1. Negative for ovarian torsion or mass 2. Uterine fibroid Electronically Signed   By: Donavan Foil M.D.   On: 02/05/2021 20:30   US Pelvis Complete  Result Date: 02/05/2021 CLINICAL DATA:  Pelvic pain EXAM: TRANSABDOMINAL AND TRANSVAGINAL ULTRASOUND OF PELVIS DOPPLER ULTRASOUND OF OVARIES TECHNIQUE: Both transabdominal and transvaginal ultrasound examinations of the pelvis were performed. Transabdominal technique was performed for global imaging of the pelvis including uterus, ovaries, adnexal regions, and pelvic cul-de-sac. It was necessary to proceed with endovaginal exam following the transabdominal exam to visualize the uterus endometrium ovaries. Color and duplex Doppler ultrasound was utilized to evaluate blood flow to the ovaries. COMPARISON:  CT 07/28/2018 FINDINGS: Uterus Measurements: 8.2 x 5.4 x 6.9 cm = volume: 159.1 mL. Left posterior uterine fibroid measuring 3.7 x 2.5 x 3.8 cm. Endometrium Thickness: 6 mm.  No focal abnormality visualized. Right ovary Measurements: 3.2 x 2.2 x 2.1 cm = volume: 7.6 mL. Normal appearance/no adnexal mass. Left ovary Measurements: 3 x 1.2 x 2.1 cm = volume: 3.9 mL. Normal appearance/no adnexal mass. Pulsed Doppler evaluation of both ovaries demonstrates  normal low-resistance arterial and venous waveforms. Other findings No abnormal free fluid. IMPRESSION: 1. Negative for ovarian torsion or mass 2. Uterine fibroid Electronically Signed   By: Donavan Foil M.D.   On: 02/05/2021 20:30   CT ABDOMEN PELVIS W CONTRAST  Result Date: 02/05/2021 CLINICAL DATA:  Right lower quadrant abdominal pain with appendicitis suspected. EXAM: CT ABDOMEN AND PELVIS WITH CONTRAST TECHNIQUE: Multidetector CT imaging of the abdomen and pelvis was performed using the standard protocol following bolus administration of intravenous contrast. CONTRAST:  58m OMNIPAQUE IOHEXOL 350 MG/ML SOLN COMPARISON:  08/18/2019 FINDINGS: Lower chest: No acute abnormality. Hepatobiliary: Mild diffuse fatty infiltration of the liver. No focal lesions. Gallbladder and bile ducts are unremarkable. Pancreas: Unremarkable. No pancreatic ductal dilatation or surrounding inflammatory changes. Spleen: Normal in size without focal abnormality. Adrenals/Urinary Tract: Adrenal glands are unremarkable. Kidneys are normal, without renal calculi, focal lesion, or hydronephrosis. Bladder is decompressed. Stomach/Bowel: The stomach, small bowel, and colon are not abnormally distended. The appendix is normal. No bowel wall thickening or inflammatory changes are appreciated. Vascular/Lymphatic: No significant vascular findings are present. No enlarged abdominal or pelvic lymph nodes. Reproductive: Nodular enlargement of the uterus consistent with uterine fibroids. Ovaries are not enlarged. Somewhat tubular cystic appearances in the adnexal regions, possibly hydrosalpinx. Small amount of free fluid in the low pelvis with stranding in the pelvic fat. This likely represents physiologic free fluid although infectious process or salpingitis could have this appearance. Other: No free air in the abdomen. Abdominal wall musculature appears intact. Musculoskeletal: No acute or significant osseous findings. IMPRESSION: 1. Appendix  is normal. No evidence of bowel obstruction or inflammation. 2. Tubular cystic changes in the adnexal regions, possibly hydrosalpinx. Free fluid in the pelvis and pelvic stranding may indicate physiologic change or salpingitis. No abscess identified. 3. Mild fatty infiltration of the liver. Electronically Signed   By: WLucienne CapersM.D.   On: 02/05/2021 21:23   [place]/Ven Flow Abd Pelv Doppler  Result Date: 02/05/2021 CLINICAL DATA:  Pelvic pain EXAM: TRANSABDOMINAL AND TRANSVAGINAL ULTRASOUND OF PELVIS  DOPPLER ULTRASOUND OF OVARIES TECHNIQUE: Both transabdominal and transvaginal ultrasound examinations of the pelvis were performed. Transabdominal technique was performed for global imaging of the pelvis including uterus, ovaries, adnexal regions, and pelvic cul-de-sac. It was necessary to proceed with endovaginal exam following the transabdominal exam to visualize the uterus endometrium ovaries. Color and duplex Doppler ultrasound was utilized to evaluate blood flow to the ovaries. COMPARISON:  CT 07/28/2018 FINDINGS: Uterus Measurements: 8.2 x 5.4 x 6.9 cm = volume: 159.1 mL. Left posterior uterine fibroid measuring 3.7 x 2.5 x 3.8 cm. Endometrium Thickness: 6 mm.  No focal abnormality visualized. Right ovary Measurements: 3.2 x 2.2 x 2.1 cm = volume: 7.6 mL. Normal appearance/no adnexal mass. Left ovary Measurements: 3 x 1.2 x 2.1 cm = volume: 3.9 mL. Normal appearance/no adnexal mass. Pulsed Doppler evaluation of both ovaries demonstrates normal low-resistance arterial and venous waveforms. Other findings No abnormal free fluid. IMPRESSION: 1. Negative for ovarian torsion or mass 2. Uterine fibroid Electronically Signed   By: Donavan Foil M.D.   On: 02/05/2021 20:30    Procedures Procedures   Medications Ordered in ED Medications  cefOXitin (MEFOXIN) 2 g in sodium chloride 0.9 % 100 mL IVPB (has no administration in time range)  metroNIDAZOLE (FLAGYL) IVPB 500 mg (500 mg Intravenous New Bag/Given  02/05/21 2316)  doxycycline (VIBRA-TABS) tablet 100 mg (100 mg Oral Given 02/05/21 2313)  oxyCODONE-acetaminophen (PERCOCET/ROXICET) 5-325 MG per tablet 1 tablet (has no administration in time range)  morphine 4 MG/ML injection 4 mg (4 mg Intravenous Given 02/05/21 1940)  iohexol (OMNIPAQUE) 350 MG/ML injection 80 mL (80 mLs Intravenous Contrast Given 02/05/21 2106)  morphine 4 MG/ML injection 4 mg (4 mg Intravenous Given 02/05/21 2310)    ED Course  I have reviewed the triage vital signs and the nursing notes.  Pertinent labs & imaging results that were available during my care of the patient were reviewed by me and considered in my medical decision making (see chart for details).    MDM Rules/Calculators/A&P                           Patient seen in the emergency department for evaluation of abdominal pain and vaginal bleeding.  Physical exam reveals an ill-appearing patient with a very tender abdomen worse in the right and left lower quadrants, suprapubic.  Pelvic exam with scant vaginal bleeding.  Due to significant concerning abdominal exam and patient history of hematometra, a stat pelvic ultrasound was obtained to rule out torsion or uterine hematoma.  This was negative for torsion and did show evidence of uterine fibroid.  CT abdomen pelvis was obtained that shows evidence of likely salpingitis which I think is likely the source of the patient's pain.  She did have improvement with morphine and OB/GYN was consulted who recommended admission to their service with cefoxitin, doxycycline and Flagyl for 24-hour observation and transition to p.o. therapy.  Patient then admitted. Final Clinical Impression(s) / ED Diagnoses Final diagnoses:  Salpingitis    Rx / DC Orders ED Discharge Orders     None        Tomoki Lucken, MD 02/06/21 0041    Teressa Lower, MD 02/06/21 [place], [place], MD 02/10/21 2352

## 2021-02-05 NOTE — ED Triage Notes (Addendum)
Per EMS, patient from work, sharp lower abdominal pain since 1100 this morning. Diaphoretic with EMS. Vaginal bleeding today.   20g L hand 219mg Fentanyl with EMS

## 2021-02-06 ENCOUNTER — Encounter (HOSPITAL_COMMUNITY): Payer: Self-pay | Admitting: Obstetrics and Gynecology

## 2021-02-06 ENCOUNTER — Other Ambulatory Visit: Payer: Self-pay

## 2021-02-06 DIAGNOSIS — N7093 Salpingitis and oophoritis, unspecified: Secondary | ICD-10-CM | POA: Diagnosis present

## 2021-02-06 DIAGNOSIS — R109 Unspecified abdominal pain: Secondary | ICD-10-CM | POA: Diagnosis present

## 2021-02-06 DIAGNOSIS — Z86001 Personal history of in-situ neoplasm of cervix uteri: Secondary | ICD-10-CM | POA: Diagnosis not present

## 2021-02-06 DIAGNOSIS — Z23 Encounter for immunization: Secondary | ICD-10-CM | POA: Diagnosis not present

## 2021-02-06 DIAGNOSIS — K760 Fatty (change of) liver, not elsewhere classified: Secondary | ICD-10-CM | POA: Diagnosis present

## 2021-02-06 DIAGNOSIS — R102 Pelvic and perineal pain: Secondary | ICD-10-CM | POA: Diagnosis present

## 2021-02-06 DIAGNOSIS — Z20822 Contact with and (suspected) exposure to covid-19: Secondary | ICD-10-CM | POA: Diagnosis present

## 2021-02-06 DIAGNOSIS — N7091 Salpingitis, unspecified: Secondary | ICD-10-CM | POA: Diagnosis not present

## 2021-02-06 DIAGNOSIS — D259 Leiomyoma of uterus, unspecified: Secondary | ICD-10-CM | POA: Diagnosis present

## 2021-02-06 DIAGNOSIS — Z87891 Personal history of nicotine dependence: Secondary | ICD-10-CM | POA: Diagnosis not present

## 2021-02-06 LAB — URINALYSIS, ROUTINE W REFLEX MICROSCOPIC
Bilirubin Urine: NEGATIVE
Glucose, UA: NEGATIVE mg/dL
Ketones, ur: NEGATIVE mg/dL
Leukocytes,Ua: NEGATIVE
Nitrite: NEGATIVE
Protein, ur: NEGATIVE mg/dL
Specific Gravity, Urine: <1.005 — ABNORMAL LOW (ref 1.005–1.030)
pH: 6.5 (ref 5.0–8.0)

## 2021-02-06 LAB — CBC WITH DIFFERENTIAL/PLATELET
Abs Immature Granulocytes: 0.03 10*3/uL (ref 0.00–0.07)
Basophils Absolute: 0 10*3/uL (ref 0.0–0.1)
Basophils Relative: 0 %
Eosinophils Absolute: 0 10*3/uL (ref 0.0–0.5)
Eosinophils Relative: 0 %
HCT: 34.2 % — ABNORMAL LOW (ref 36.0–46.0)
Hemoglobin: 11.9 g/dL — ABNORMAL LOW (ref 12.0–15.0)
Immature Granulocytes: 0 %
Lymphocytes Relative: 12 %
Lymphs Abs: 1.2 10*3/uL (ref 0.7–4.0)
MCH: 30.6 pg (ref 26.0–34.0)
MCHC: 34.8 g/dL (ref 30.0–36.0)
MCV: 87.9 fL (ref 80.0–100.0)
Monocytes Absolute: 0.4 10*3/uL (ref 0.1–1.0)
Monocytes Relative: 5 %
Neutro Abs: 7.7 10*3/uL (ref 1.7–7.7)
Neutrophils Relative %: 83 %
Platelets: 236 10*3/uL (ref 150–400)
RBC: 3.89 MIL/uL (ref 3.87–5.11)
RDW: 13.3 % (ref 11.5–15.5)
WBC: 9.4 10*3/uL (ref 4.0–10.5)
nRBC: 0 % (ref 0.0–0.2)

## 2021-02-06 LAB — SARS CORONAVIRUS 2 (TAT 6-24 HRS): SARS Coronavirus 2: NEGATIVE

## 2021-02-06 LAB — RPR: RPR Ser Ql: NONREACTIVE

## 2021-02-06 LAB — RAPID HIV SCREEN (HIV 1/2 AB+AG)
HIV 1/2 Antibodies: NONREACTIVE
HIV-1 P24 Antigen - HIV24: NONREACTIVE

## 2021-02-06 LAB — HEPATITIS B SURFACE ANTIGEN: Hepatitis B Surface Ag: NONREACTIVE

## 2021-02-06 MED ORDER — BISACODYL 5 MG PO TBEC
5.0000 mg | DELAYED_RELEASE_TABLET | Freq: Every day | ORAL | Status: DC | PRN
  Administered 2021-02-06: 5 mg via ORAL
  Filled 2021-02-06: qty 1

## 2021-02-06 MED ORDER — ONDANSETRON HCL 4 MG PO TABS: 4.0000 mg | ORAL_TABLET | Freq: Four times a day (QID) | ORAL | Status: DC | PRN

## 2021-02-06 MED ORDER — METRONIDAZOLE 500 MG PO TABS
500.0000 mg | ORAL_TABLET | Freq: Two times a day (BID) | ORAL | Status: DC
  Administered 2021-02-06 (×2): 500 mg via ORAL
  Filled 2021-02-06 (×2): qty 1

## 2021-02-06 MED ORDER — PRENATAL MULTIVITAMIN CH
1.0000 | ORAL_TABLET | Freq: Every day | ORAL | Status: DC
  Administered 2021-02-06: 1 via ORAL
  Filled 2021-02-06: qty 1

## 2021-02-06 MED ORDER — IBUPROFEN 600 MG PO TABS
600.0000 mg | ORAL_TABLET | Freq: Four times a day (QID) | ORAL | Status: DC | PRN
  Administered 2021-02-06: 600 mg via ORAL
  Filled 2021-02-06: qty 1

## 2021-02-06 MED ORDER — POLYETHYLENE GLYCOL 3350 17 G PO PACK
17.0000 g | PACK | Freq: Every day | ORAL | Status: DC
  Administered 2021-02-06: 17 g via ORAL
  Filled 2021-02-06: qty 1

## 2021-02-06 MED ORDER — DOXYCYCLINE HYCLATE 100 MG PO TABS
100.0000 mg | ORAL_TABLET | Freq: Two times a day (BID) | ORAL | Status: DC
  Administered 2021-02-06 (×2): 100 mg via ORAL
  Filled 2021-02-06 (×2): qty 1

## 2021-02-06 MED ORDER — OXYCODONE-ACETAMINOPHEN 5-325 MG PO TABS
1.0000 | ORAL_TABLET | Freq: Four times a day (QID) | ORAL | Status: DC | PRN
  Administered 2021-02-06: 1 via ORAL
  Filled 2021-02-06: qty 1

## 2021-02-06 MED ORDER — SODIUM CHLORIDE 0.9 % IV SOLN: 250.0000 mL | INTRAVENOUS | Status: DC | PRN

## 2021-02-06 MED ORDER — SODIUM CHLORIDE 0.9% FLUSH
3.0000 mL | INTRAVENOUS | Status: DC | PRN
  Administered 2021-02-06: 3 mL via INTRAVENOUS

## 2021-02-06 MED ORDER — SODIUM CHLORIDE 0.9 % IV SOLN
2.0000 g | Freq: Four times a day (QID) | INTRAVENOUS | Status: DC
  Administered 2021-02-06 – 2021-02-07 (×5): 2 g via INTRAVENOUS
  Filled 2021-02-06 (×7): qty 2

## 2021-02-06 MED ORDER — SIMETHICONE 80 MG PO CHEW: 80.0000 mg | CHEWABLE_TABLET | Freq: Four times a day (QID) | ORAL | Status: DC | PRN

## 2021-02-06 MED ORDER — SODIUM CHLORIDE 0.9% FLUSH
3.0000 mL | Freq: Two times a day (BID) | INTRAVENOUS | Status: DC
  Administered 2021-02-06 (×2): 3 mL via INTRAVENOUS

## 2021-02-06 MED ORDER — ONDANSETRON HCL 4 MG/2ML IJ SOLN: 4.0000 mg | Freq: Four times a day (QID) | INTRAMUSCULAR | Status: DC | PRN

## 2021-02-06 MED ORDER — INFLUENZA VAC SPLIT QUAD 0.5 ML IM SUSY
0.5000 mL | PREFILLED_SYRINGE | INTRAMUSCULAR | Status: AC
  Administered 2021-02-07: 0.5 mL via INTRAMUSCULAR
  Filled 2021-02-06: qty 0.5

## 2021-02-06 MED ORDER — ALUM & MAG HYDROXIDE-SIMETH 200-200-20 MG/5ML PO SUSP: 30.0000 mL | ORAL | Status: DC | PRN

## 2021-02-06 MED ORDER — OXYCODONE-ACETAMINOPHEN 5-325 MG PO TABS
1.0000 | ORAL_TABLET | ORAL | Status: DC | PRN
  Administered 2021-02-06: 1 via ORAL
  Filled 2021-02-06: qty 1

## 2021-02-06 NOTE — H&P (Signed)
Obstetrics & Gynecology H&P   Date of Admission: 02/06/2021   Requesting Provider: [facility]  Primary OBGYN: [facility] in [place]  Reason for Admission: bilateral salpingitis vs TOA on CT  History of Present Illness: Margaret Cameron is a 40 y.o. [phone] (Patient's last menstrual period was 01/15/2021.), with the above CC. PMHx is significant for CKC, hematometra.  Patient presented to [facility] ED yesterday for acute onset abdominal pain that started early in the day and pain felt similar to when she was dx with a hematometra and need OR cervical dilation.  ED u/s was negative but CT showed concern for infection (see below). Pt started on cefoxitin, flagyl and doxycycline. Hospitalists at [facility] felt uncomfortable with admitting so patient transferred to [facility] for GYN admission. Pt improved there with IV pain medications  Patients pain is improved. LMP 3 weeks ago and she states she is on OCPs and has regular monthly periods.     ROS: A 12-point review of systems was performed and negative, except as stated in the above HPI.  OBGYN History: As per HPI. OB History  Gravida Para Term Preterm AB Living  '5 2 2 '$ 0 3 2  SAB IAB Ectopic Multiple Live Births  2 1 0 0 2    # Outcome Date GA Lbr Len/2nd Weight Sex Delivery Anes PTL Lv  5 Term 02/24/13 82w3d09:48 / 00:01 3160 g M Vag-Spont EPI  LIV  4 SAB 04/13/11 130w5d 00:02  M  None  FD     Birth Comments: None  3 Term 04/10/08 3747w0dM    LIV  2 SAB           1 IAB              Past Medical History: Past Medical History:  Diagnosis Date   Abnormal Pap smear of cervix    LEEP 2011   Anemia    with pregnancy   Chronic headaches    Cyst near coccyx    Hypoglycemia    Migraine    Pilonidal cyst    Pyelonephritis    x 3 episodes   [place] cell [place]   Past Surgical History: Past Surgical History:  Procedure Laterality Date   CERVICAL CONE BIOPSY     CERVICAL CONIZATION W/BX  2019   DILATION AND CURETTAGE  OF UTERUS  2008   DILATION AND CURETTAGE OF UTERUS N/A 09/27/2018   Procedure: DILATATION AND CURETTAGE;  Surgeon: ArnWoodroe ModeD;  Location: [facility];  Service: Gynecology;  Laterality: N/A;   LEEP  2011   OTHER SURGICAL HISTORY     Pynlodial cyst removed from tail bone 2012   OTHER SURGICAL HISTORY  10/2010   removal of cyst near coxxyx   PILONIDAL CYST EXCISION  10/2010    Family History:  Family History  Problem Relation Age of Onset   Hypertension Brother    Cancer Maternal Aunt        breast    Social History:  Social History   Socioeconomic History   Marital status: Married    Spouse name: Not on file   Number of children: Not on file   Years of education: Not on file   Highest education level: Not on file  Occupational History   Not on file  Tobacco Use   Smoking status: Former    Packs/day: 0.10    Types: Cigarettes    Quit date: 07/14/2014  Years since quitting: 6.5   Smokeless tobacco: Never  Vaping Use   Vaping Use: Never used  Substance and Sexual Activity   Alcohol use: No   Drug use: No   Sexual activity: Not Currently    Birth control/protection: Inserts  Other Topics Concern   Not on file  Social History Narrative   Not on file   Social Determinants of Health   Financial Resource Strain: Not on file  Food Insecurity: Not on file  Transportation Needs: Not on file  Physical Activity: Not on file  Stress: Not on file  Social Connections: Not on file  Intimate Partner Violence: Not on file    Allergy: Allergies  Allergen Reactions   Codeine Rash   Septra [Bactrim] Rash    Current Outpatient Medications: Medications Prior to Admission  Medication Sig Dispense Refill Last Dose   Ascorbic Acid (VITAMIN C) 1000 MG tablet Take 1,000 mg by mouth daily.      ibuprofen (ADVIL) 200 MG tablet Take 600 mg by mouth every 6 (six) hours as needed for moderate pain.      naproxen (NAPROSYN) 500 MG tablet Take 1 tablet (500 mg total) by mouth 2  (two) times daily. 30 tablet 0    ondansetron (ZOFRAN ODT) 4 MG disintegrating tablet Take 1 tablet (4 mg total) by mouth every 8 (eight) hours as needed for nausea or vomiting. 20 tablet 0    oxymetazoline (AFRIN) 0.05 % nasal spray Place 2 sprays into both nostrils daily as needed for congestion.      polyethylene glycol (MIRALAX / GLYCOLAX) 17 g packet Take 17 g by mouth daily.      sertraline (ZOLOFT) 50 MG tablet Take 50 mg by mouth daily.      OCPs   Hospital Medications: Current Facility-Administered Medications  Medication Dose Route Frequency Provider Last Rate Last Admin   0.9 %  sodium chloride infusion  250 mL Intravenous PRN Aletha Halim, MD       alum & mag hydroxide-simeth (MAALOX/MYLANTA) 200-200-20 MG/5ML suspension 30 mL  30 mL Oral Q4H PRN Aletha Halim, MD       bisacodyl (DULCOLAX) EC tablet 5 mg  5 mg Oral Daily PRN Aletha Halim, MD       cefOXitin (MEFOXIN) 2 g in sodium chloride 0.9 % 100 mL IVPB  2 g Intravenous Q6H Aletha Halim, MD 200 mL/hr at 02/06/21 0549 2 g at 02/06/21 0549   doxycycline (VIBRA-TABS) tablet 100 mg  100 mg Oral Q12H Aletha Halim, MD   100 mg at 02/06/21 1006   ibuprofen (ADVIL) tablet 600 mg  600 mg Oral Q6H PRN Aletha Halim, MD   600 mg at 02/06/21 0650   [START ON 02/07/2021] influenza vac split quadrivalent PF (FLUARIX) injection 0.5 mL  0.5 mL Intramuscular Tomorrow-1000 Aletha Halim, MD       metroNIDAZOLE (FLAGYL) tablet 500 mg  500 mg Oral Q12H Aletha Halim, MD   500 mg at 02/06/21 1006   ondansetron (ZOFRAN) tablet 4 mg  4 mg Oral Q6H PRN Aletha Halim, MD       Or   ondansetron (ZOFRAN) injection 4 mg  4 mg Intravenous Q6H PRN Aletha Halim, MD       oxyCODONE-acetaminophen (PERCOCET/ROXICET) 5-325 MG per tablet 1-2 tablet  1-2 tablet Oral Q6H PRN Aletha Halim, MD   1 tablet at 02/06/21 1006   polyethylene glycol (MIRALAX / GLYCOLAX) packet 17 g  17 g Oral Daily Aletha Halim, MD  17 g at  02/06/21 1006   prenatal multivitamin tablet 1 tablet  1 tablet Oral Q1200 Aletha Halim, MD       simethicone (MYLICON) chewable tablet 80 mg  80 mg Oral QID PRN Aletha Halim, MD       sodium chloride flush (NS) 0.9 % injection 3 mL  3 mL Intravenous Q12H Aletha Halim, MD       sodium chloride flush (NS) 0.9 % injection 3 mL  3 mL Intravenous PRN Aletha Halim, MD         Physical Exam:   Current Vital Signs 24h Vital Sign Ranges  T 97.7 F (36.5 C) Temp  Avg: 97.9 F (36.6 C)  Min: 97.7 F (36.5 C)  Max: 98.3 F (36.8 C)  BP (!) 118/58  BP  Min: 109/55  Max: 132/74  HR 71 Pulse  Avg: 78.2  Min: 71  Max: 109  RR 18 Resp  Avg: 18.9  Min: 16  Max: 22  SaO2 98 % Room Air SpO2  Avg: 98.3 %  Min: 94 %  Max: 100 %       24 Hour I/O Current Shift I/O  Time Ins Outs 09/16 0701 - 09/17 0700 In: 200  Out: -  No intake/output data recorded.   Body mass index is 28.25 kg/m. General appearance: Well nourished, well developed female in no acute distress.  Cardiovascular: S1, S2 normal, no murmur, rub or gallop, regular rate and rhythm Respiratory:  Clear to auscultation bilateral. Normal respiratory effort Abdomen: positive bowel sounds and no masses, hernias; diffusely non tender to palpation, non distended Neuro/Psych:  Normal mood and affect.  Skin:  Warm and dry.  Extremities: no clubbing, cyanosis, or edema.   Pelvic exam in ED: scant vaginal bleeding  Laboratory: Negative: covid, hcg, hepB surface antigen, rapid hiv, u/a, lipase Pending: RPR, GC/CT  Recent Labs  Lab 02/05/21 1902 02/06/21 0510  WBC 14.7* 9.4  HGB 13.0 11.9*  HCT 37.1 34.2*  PLT 259 236   Recent Labs  Lab 02/05/21 1902  NA 137  K 3.6  CL 108  CO2 20*  BUN 9  CREATININE 0.77  CALCIUM 9.5  PROT 7.3  BILITOT 0.9  ALKPHOS 49  ALT 11  AST 16  GLUCOSE 86    Imaging:  Narrative & Impression  CLINICAL DATA:  Right lower quadrant abdominal pain with appendicitis suspected.    EXAM: CT ABDOMEN AND PELVIS WITH CONTRAST   TECHNIQUE: Multidetector CT imaging of the abdomen and pelvis was performed using the standard protocol following bolus administration of intravenous contrast.   CONTRAST:  18m OMNIPAQUE IOHEXOL 350 MG/ML SOLN   COMPARISON:  08/18/2019   FINDINGS: Lower chest: No acute abnormality.   Hepatobiliary: Mild diffuse fatty infiltration of the liver. No focal lesions. Gallbladder and bile ducts are unremarkable.   Pancreas: Unremarkable. No pancreatic ductal dilatation or surrounding inflammatory changes.   Spleen: Normal in size without focal abnormality.   Adrenals/Urinary Tract: Adrenal glands are unremarkable. Kidneys are normal, without renal calculi, focal lesion, or hydronephrosis. Bladder is decompressed.   Stomach/Bowel: The stomach, small bowel, and colon are not abnormally distended. The appendix is normal. No bowel wall thickening or inflammatory changes are appreciated.   Vascular/Lymphatic: No significant vascular findings are present. No enlarged abdominal or pelvic lymph nodes.   Reproductive: Nodular enlargement of the uterus consistent with uterine fibroids. Ovaries are not enlarged. Somewhat tubular cystic appearances in the adnexal regions, possibly hydrosalpinx. Small amount of  free fluid in the low pelvis with stranding in the pelvic fat. This likely represents physiologic free fluid although infectious process or salpingitis could have this appearance.   Other: No free air in the abdomen. Abdominal wall musculature appears intact.   Musculoskeletal: No acute or significant osseous findings.   IMPRESSION: 1. Appendix is normal. No evidence of bowel obstruction or inflammation. 2. Tubular cystic changes in the adnexal regions, possibly hydrosalpinx. Free fluid in the pelvis and pelvic stranding may indicate physiologic change or salpingitis. No abscess identified. 3. Mild fatty infiltration of the  liver.     Electronically Signed   By: Lucienne Capers M.D.   On: 02/05/2021 21:23   Narrative & Impression  CLINICAL DATA:  Pelvic pain   EXAM: TRANSABDOMINAL AND TRANSVAGINAL ULTRASOUND OF PELVIS   DOPPLER ULTRASOUND OF OVARIES   TECHNIQUE: Both transabdominal and transvaginal ultrasound examinations of the pelvis were performed. Transabdominal technique was performed for global imaging of the pelvis including uterus, ovaries, adnexal regions, and pelvic cul-de-sac.   It was necessary to proceed with endovaginal exam following the transabdominal exam to visualize the uterus endometrium ovaries. Color and duplex Doppler ultrasound was utilized to evaluate blood flow to the ovaries.   COMPARISON:  CT 07/28/2018   FINDINGS: Uterus   Measurements: 8.2 x 5.4 x 6.9 cm = volume: 159.1 mL. Left posterior uterine fibroid measuring 3.7 x 2.5 x 3.8 cm.   Endometrium   Thickness: 6 mm.  No focal abnormality visualized.   Right ovary   Measurements: 3.2 x 2.2 x 2.1 cm = volume: 7.6 mL. Normal appearance/no adnexal mass.   Left ovary   Measurements: 3 x 1.2 x 2.1 cm = volume: 3.9 mL. Normal appearance/no adnexal mass.   Pulsed Doppler evaluation of both ovaries demonstrates normal low-resistance arterial and venous waveforms.   Other findings   No abnormal free fluid.   IMPRESSION: 1. Negative for ovarian torsion or mass 2. Uterine fibroid     Electronically Signed   By: Donavan Foil M.D.   On: 02/05/2021 20:30   Assessment: patient improved  Plan: Continue IV abx. Consider transition to PO only tomorrow. F/u pending labs. SCDs. Regular diet. SLIV  Anticipate discharge HD#2-3  Total time taking care of the patient was 30 minutes, with greater than 50% of the time spent in face to face interaction with the patient.  Durene Romans MD Attending [facility] GYN Consult Phone: [phone] (M-F, 0800-1700) &  [phone] (Off hours, weekends, holidays)

## 2021-02-06 NOTE — ED Notes (Signed)
Report called to receiving nurse, Carole Civil

## 2021-02-06 NOTE — Progress Notes (Signed)
Physical assessment WNL. Patient abdomen non-tender upon palpation with hypoactive bowel sounds. Patient states she is experiencing constipation since Tuesday. Pain and nausea subsiding to patient comfort level with position change, heat, and medication. Will continue to monitor for pain control and S/S infection.

## 2021-02-06 NOTE — ED Notes (Signed)
[facility] contacted for transportation 

## 2021-02-07 DIAGNOSIS — N7093 Salpingitis and oophoritis, unspecified: Secondary | ICD-10-CM | POA: Diagnosis not present

## 2021-02-07 LAB — CBC WITH DIFFERENTIAL/PLATELET
Abs Immature Granulocytes: 0.01 10*3/uL (ref 0.00–0.07)
Basophils Absolute: 0 10*3/uL (ref 0.0–0.1)
Basophils Relative: 1 %
Eosinophils Absolute: 0.1 10*3/uL (ref 0.0–0.5)
Eosinophils Relative: 2 %
HCT: 33 % — ABNORMAL LOW (ref 36.0–46.0)
Hemoglobin: 11.2 g/dL — ABNORMAL LOW (ref 12.0–15.0)
Immature Granulocytes: 0 %
Lymphocytes Relative: 37 %
Lymphs Abs: 2.1 10*3/uL (ref 0.7–4.0)
MCH: 30.3 pg (ref 26.0–34.0)
MCHC: 33.9 g/dL (ref 30.0–36.0)
MCV: 89.2 fL (ref 80.0–100.0)
Monocytes Absolute: 0.4 10*3/uL (ref 0.1–1.0)
Monocytes Relative: 7 %
Neutro Abs: 3.1 10*3/uL (ref 1.7–7.7)
Neutrophils Relative %: 53 %
Platelets: 231 10*3/uL (ref 150–400)
RBC: 3.7 MIL/uL — ABNORMAL LOW (ref 3.87–5.11)
RDW: 13.5 % (ref 11.5–15.5)
WBC: 5.8 10*3/uL (ref 4.0–10.5)
nRBC: 0 % (ref 0.0–0.2)

## 2021-02-07 MED ORDER — DOXYCYCLINE HYCLATE 100 MG PO TABS: 100.0000 mg | ORAL_TABLET | Freq: Two times a day (BID) | ORAL | 0 refills | Status: AC

## 2021-02-07 MED ORDER — ACETAMINOPHEN 500 MG PO TABS: 500.0000 mg | ORAL_TABLET | Freq: Four times a day (QID) | ORAL | 0 refills | Status: DC | PRN

## 2021-02-07 MED ORDER — METRONIDAZOLE 500 MG PO TABS: 500.0000 mg | ORAL_TABLET | Freq: Two times a day (BID) | ORAL | 0 refills | Status: AC

## 2021-02-07 NOTE — Plan of Care (Signed)

## 2021-02-07 NOTE — Discharge Summary (Signed)
Physician Discharge Summary  Patient ID: Margaret Cameron MRN: [id number] DOB/AGE: Dec 19, 1980 40 y.o.  Admit date: 02/05/2021 Discharge date: 02/07/2021  Admission Diagnoses: Salpingitis  Discharge Diagnoses:  Active Problems:   Salpingitis   TOA (tubo-ovarian abscess)   Discharged Condition: stable  Hospital Course: [id number] admitted due to acute onset of abdominal pain and concern for salpingitis.  Pt treated with IV antibiotics and fluids.  Pt significantly improved with treatment.  She remains afebrile x 24hr, vitals stable and clinically feeling much better.  Plan to discharge home with outpatient antibiotics and follow up.   Consults: None  Significant Diagnostic Studies: labs:  CBC Latest Ref Rng & Units 02/07/2021 02/06/2021 02/05/2021  WBC 4.0 - 10.5 K/uL 5.8 9.4 14.7(H)  Hemoglobin 12.0 - 15.0 g/dL 11.2(L) 11.9(L) 13.0  Hematocrit 36.0 - 46.0 % 33.0(L) 34.2(L) 37.1  Platelets 150 - 400 K/uL 231 236 259   EXAM: CT ABDOMEN AND PELVIS WITH CONTRAST   TECHNIQUE: Multidetector CT imaging of the abdomen and pelvis was performed using the standard protocol following bolus administration of intravenous contrast.   CONTRAST:  47m OMNIPAQUE IOHEXOL 350 MG/ML SOLN   COMPARISON:  08/18/2019   FINDINGS: Lower chest: No acute abnormality.   Hepatobiliary: Mild diffuse fatty infiltration of the liver. No focal lesions. Gallbladder and bile ducts are unremarkable.   Pancreas: Unremarkable. No pancreatic ductal dilatation or surrounding inflammatory changes.   Spleen: Normal in size without focal abnormality.   Adrenals/Urinary Tract: Adrenal glands are unremarkable. Kidneys are normal, without renal calculi, focal lesion, or hydronephrosis. Bladder is decompressed.   Stomach/Bowel: The stomach, small bowel, and colon are not abnormally distended. The appendix is normal. No bowel wall thickening or inflammatory changes are appreciated.   Vascular/Lymphatic:  No significant vascular findings are present. No enlarged abdominal or pelvic lymph nodes.   Reproductive: Nodular enlargement of the uterus consistent with uterine fibroids. Ovaries are not enlarged. Somewhat tubular cystic appearances in the adnexal regions, possibly hydrosalpinx. Small amount of free fluid in the low pelvis with stranding in the pelvic fat. This likely represents physiologic free fluid although infectious process or salpingitis could have this appearance.   Other: No free air in the abdomen. Abdominal wall musculature appears intact.   Musculoskeletal: No acute or significant osseous findings.   IMPRESSION: 1. Appendix is normal. No evidence of bowel obstruction or inflammation. 2. Tubular cystic changes in the adnexal regions, possibly hydrosalpinx. Free fluid in the pelvis and pelvic stranding may indicate physiologic change or salpingitis. No abscess identified. 3. Mild fatty infiltration of the liver.     Electronically Signed   By: WLucienne CapersM.D.   On: 02/05/2021 21:23    Narrative & Impression  CLINICAL DATA:  Pelvic pain   EXAM: TRANSABDOMINAL AND TRANSVAGINAL ULTRASOUND OF PELVIS   DOPPLER ULTRASOUND OF OVARIES   TECHNIQUE: Both transabdominal and transvaginal ultrasound examinations of the pelvis were performed. Transabdominal technique was performed for global imaging of the pelvis including uterus, ovaries, adnexal regions, and pelvic cul-de-sac.   It was necessary to proceed with endovaginal exam following the transabdominal exam to visualize the uterus endometrium ovaries. Color and duplex Doppler ultrasound was utilized to evaluate blood flow to the ovaries.   COMPARISON:  CT 07/28/2018   FINDINGS: Uterus   Measurements: 8.2 x 5.4 x 6.9 cm = volume: 159.1 mL. Left posterior uterine fibroid measuring 3.7 x 2.5 x 3.8 cm.   Endometrium   Thickness: 6 mm.  No focal abnormality visualized.  Right ovary   Measurements:  3.2 x 2.2 x 2.1 cm = volume: 7.6 mL. Normal appearance/no adnexal mass.   Left ovary   Measurements: 3 x 1.2 x 2.1 cm = volume: 3.9 mL. Normal appearance/no adnexal mass.   Pulsed Doppler evaluation of both ovaries demonstrates normal low-resistance arterial and venous waveforms.   Other findings   No abnormal free fluid.   IMPRESSION: 1. Negative for ovarian torsion or mass 2. Uterine fibroid      Treatments: IV hydration and antibiotics: metronidazole and Doxy, Cefoxitin  Discharge Exam: Blood pressure (!) 110/52, pulse 64, temperature 98.2 F (36.8 C), resp. rate 17, height '5\' 6"'$  (1.676 m), weight 79.4 kg, last menstrual period 01/15/2021, SpO2 98 %. General appearance: alert, cooperative, and no distress Resp: clear to auscultation bilaterally Cardio: regular rate and rhythm GI: soft, non-tender; bowel sounds normal; no masses,  no organomegaly Extremities: edema absent, no calf tenderness bilaterally Skin: warm and dry Neurologic: Alert and oriented X 3, normal strength and tone. Normal symmetric reflexes. Normal coordination and gait  Disposition: discharge home in stable condition   Allergies as of 02/07/2021       Reactions   Codeine Rash   Septra [bactrim] Rash        Medication List     STOP taking these medications    naproxen 500 MG tablet Commonly known as: NAPROSYN       TAKE these medications    acetaminophen 500 MG tablet Commonly known as: TYLENOL Take 1 tablet (500 mg total) by mouth every 6 (six) hours as needed for mild pain or moderate pain.   doxycycline 100 MG tablet Commonly known as: VIBRA-TABS Take 1 tablet (100 mg total) by mouth every 12 (twelve) hours for 14 days.   ibuprofen 200 MG tablet Commonly known as: ADVIL Take 600 mg by mouth every 6 (six) hours as needed for moderate pain.   metroNIDAZOLE 500 MG tablet Commonly known as: FLAGYL Take 1 tablet (500 mg total) by mouth every 12 (twelve) hours for 14 days.    ondansetron 4 MG disintegrating tablet Commonly known as: Zofran ODT Take 1 tablet (4 mg total) by mouth every 8 (eight) hours as needed for nausea or vomiting.   oxymetazoline 0.05 % nasal spray Commonly known as: AFRIN Place 2 sprays into both nostrils daily as needed for congestion.   polyethylene glycol 17 g packet Commonly known as: MIRALAX / GLYCOLAX Take 17 g by mouth daily.   sertraline 50 MG tablet Commonly known as: ZOLOFT Take 50 mg by mouth daily.   vitamin C 1000 MG tablet Take 1,000 mg by mouth daily.        Follow-up [facility] [place] Follow up in 1 week(s).   Specialty: Obstetrics and Gynecology Why: As needed, If symptoms worsen Contact information: [place], [place] [phone]                Signed: Annalee Genta 02/07/2021, 6:43 AM

## 2021-02-08 LAB — GC/CHLAMYDIA PROBE AMP (~~LOC~~) NOT AT ARMC
Chlamydia: NEGATIVE
Comment: NEGATIVE
Comment: NORMAL
Neisseria Gonorrhea: NEGATIVE

## 2021-02-15 ENCOUNTER — Other Ambulatory Visit: Payer: Self-pay

## 2021-02-15 ENCOUNTER — Ambulatory Visit (HOSPITAL_COMMUNITY)
Admission: EM | Admit: 2021-02-15 | Discharge: 2021-02-15 | Disposition: A | Discharge status: Home / Self Care | Payer: No Typology Code available for payment source | Attending: Physician Assistant | Admitting: Physician Assistant

## 2021-02-15 ENCOUNTER — Ambulatory Visit (INDEPENDENT_AMBULATORY_CARE_PROVIDER_SITE_OTHER): Payer: No Typology Code available for payment source

## 2021-02-15 ENCOUNTER — Encounter (HOSPITAL_COMMUNITY): Payer: Self-pay | Admitting: *Deleted

## 2021-02-15 DIAGNOSIS — M545 Low back pain, unspecified: Secondary | ICD-10-CM

## 2021-02-15 MED ORDER — CYCLOBENZAPRINE HCL 10 MG PO TABS: 10.0000 mg | ORAL_TABLET | Freq: Two times a day (BID) | ORAL | 0 refills | Status: DC | PRN

## 2021-02-15 NOTE — Discharge Instructions (Addendum)
Use muscle relaxer with caution. Follow up with any further concerns.
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
## 2022-04-23 ENCOUNTER — Other Ambulatory Visit: Payer: Self-pay

## 2022-04-23 ENCOUNTER — Emergency Department (HOSPITAL_COMMUNITY)
Admission: EM | Admit: 2022-04-23 | Discharge: 2022-04-23 | Disposition: A | Discharge status: Home / Self Care | Payer: No Typology Code available for payment source | Attending: Emergency Medicine | Admitting: Emergency Medicine

## 2022-04-23 DIAGNOSIS — Z5321 Procedure and treatment not carried out due to patient leaving prior to being seen by health care provider: Secondary | ICD-10-CM | POA: Diagnosis not present

## 2022-04-23 DIAGNOSIS — R109 Unspecified abdominal pain: Secondary | ICD-10-CM | POA: Insufficient documentation

## 2022-04-23 DIAGNOSIS — K625 Hemorrhage of anus and rectum: Secondary | ICD-10-CM | POA: Diagnosis present

## 2022-04-23 LAB — COMPREHENSIVE METABOLIC PANEL
ALT: 13 U/L (ref 0–44)
AST: 16 U/L (ref 15–41)
Albumin: 4.4 g/dL (ref 3.5–5.0)
Alkaline Phosphatase: 50 U/L (ref 38–126)
Anion gap: 7 (ref 5–15)
BUN: 10 mg/dL (ref 6–20)
CO2: 24 mmol/L (ref 22–32)
Calcium: 9.5 mg/dL (ref 8.9–10.3)
Chloride: 107 mmol/L (ref 98–111)
Creatinine, Ser: 0.72 mg/dL (ref 0.44–1.00)
GFR, Estimated: >60 mL/min (ref >60)
Glucose, Bld: 109 mg/dL — ABNORMAL HIGH (ref 70–99)
Potassium: 4.4 mmol/L (ref 3.5–5.1)
Sodium: 138 mmol/L (ref 135–145)
Total Bilirubin: 0.6 mg/dL (ref 0.3–1.2)
Total Protein: 7.6 g/dL (ref 6.5–8.1)

## 2022-04-23 LAB — CBC WITH DIFFERENTIAL/PLATELET
Abs Immature Granulocytes: 0.04 10*3/uL (ref 0.00–0.07)
Basophils Absolute: 0 10*3/uL (ref 0.0–0.1)
Basophils Relative: 0 %
Eosinophils Absolute: 0 10*3/uL (ref 0.0–0.5)
Eosinophils Relative: 0 %
HCT: 40.8 % (ref 36.0–46.0)
Hemoglobin: 13.7 g/dL (ref 12.0–15.0)
Immature Granulocytes: 0 %
Lymphocytes Relative: 15 %
Lymphs Abs: 1.8 10*3/uL (ref 0.7–4.0)
MCH: 30.1 pg (ref 26.0–34.0)
MCHC: 33.6 g/dL (ref 30.0–36.0)
MCV: 89.7 fL (ref 80.0–100.0)
Monocytes Absolute: 0.4 10*3/uL (ref 0.1–1.0)
Monocytes Relative: 4 %
Neutro Abs: 9.4 10*3/uL — ABNORMAL HIGH (ref 1.7–7.7)
Neutrophils Relative %: 81 %
Platelets: 256 10*3/uL (ref 150–400)
RBC: 4.55 MIL/uL (ref 3.87–5.11)
RDW: 13.1 % (ref 11.5–15.5)
WBC: 11.6 10*3/uL — ABNORMAL HIGH (ref 4.0–10.5)
nRBC: 0 % (ref 0.0–0.2)

## 2022-04-23 LAB — LIPASE, BLOOD: Lipase: 26 U/L (ref 11–51)

## 2022-04-23 NOTE — ED Provider Triage Note (Signed)
Emergency Medicine Provider Triage Evaluation Note  Margaret Cameron , a 41 y.o. female  was evaluated in triage.  Pt complains of cyst in the left axilla for the past week.  She has had to have it I&D in the past.  She reports new onset of abdominal pain and rectal bleeding of bright red blood that began today.  Also vomited x1 this morning.  Denies fevers, diarrhea, urinary symptoms.    Review of Systems  Positive: See above Negative: See above  Physical Exam  BP 125/73 (BP Location: Left Arm)   Pulse 80   Temp 98 F (36.7 C) (Oral)   Resp 16   Ht '5\' 6"'$  (1.676 m)   Wt 79 kg   SpO2 100%   BMI 28.11 kg/m  Gen:   Awake, no distress   Resp:  Normal effort  MSK:   Moves extremities without difficulty; hard mass in left axilla with redness and surrounding tenderness  Other:  Abdomen soft and non tender on palpation  Medical Decision Making  Medically screening exam initiated at 10:25 AM.  Appropriate orders placed.  Margaret Cameron was informed that the remainder of the evaluation will be completed by another provider, this initial triage assessment does not replace that evaluation, and the importance of remaining in the ED until their evaluation is complete.     Theressa Stamps R, [place] 04/23/22 1029

## 2022-04-23 NOTE — ED Triage Notes (Signed)
C/o rectal bleeding that started this am with upper abd pain.  Pt denies blood thinner usage.  Also c/o left axillary cyst. Warm compresses with relief.

## 2022-05-25 ENCOUNTER — Ambulatory Visit (HOSPITAL_COMMUNITY)
Admission: EM | Admit: 2022-05-25 | Discharge: 2022-05-25 | Disposition: A | Discharge status: Home / Self Care | Payer: No Typology Code available for payment source | Attending: Physician Assistant | Admitting: Physician Assistant

## 2022-05-25 ENCOUNTER — Encounter (HOSPITAL_COMMUNITY): Payer: Self-pay

## 2022-05-25 DIAGNOSIS — H00014 Hordeolum externum left upper eyelid: Secondary | ICD-10-CM

## 2022-05-25 MED ORDER — ERYTHROMYCIN 5 MG/GM OP OINT: TOPICAL_OINTMENT | OPHTHALMIC | 0 refills | Status: DC

## 2022-05-25 MED ORDER — AMOXICILLIN-POT CLAVULANATE 875-125 MG PO TABS: 1.0000 | ORAL_TABLET | Freq: Two times a day (BID) | ORAL | 0 refills | Status: DC

## 2022-05-25 NOTE — ED Provider Notes (Signed)
[facility]    CSN: [id number] Arrival date & time: 05/25/22  0810      History   Chief Complaint Chief Complaint  Patient presents with   Eye Problem    HPI Margaret Cameron is a 42 y.o. female.   Patient presents today with a several week history of lesion on her left upper eyelid.  She reports that initially this was uncomfortable but is no longer painful.  She has noticed a small opening at her eyelash line but has not had any significant drainage.  She does have a history of similar symptoms in the past that required surgical intervention.  She is not currently followed by ophthalmology.  She has tried warm compresses without improvement of symptoms.  Denies any visual disturbance, headache, fever, nausea, vomiting, ocular pain.  She is confident that she is not pregnant.  She does report taking antibiotics (doxycycline) approximately month ago for an unrelated cyst.  Denies additional antibiotics in the past 90 days.  She denies any contact lens use.    Past Medical History:  Diagnosis Date   Abnormal Pap smear of cervix    LEEP 2011   Anemia    with pregnancy   Chronic headaches    Cyst near coccyx    Hypoglycemia    Migraine    Pilonidal cyst    Pyelonephritis    x 3 episodes   Sickle cell trait [facility])     Patient Active Problem List   Diagnosis Date Noted   Salpingitis 02/06/2021   TOA (tubo-ovarian abscess) 02/06/2021   Abdominal pain 08/18/2019   Emesis 08/18/2019   Hematochezia 08/18/2019   Colitis 08/18/2019   Cervical stenosis (uterine cervix) 09/27/2018   Hematometra 09/27/2018   Severe dysplasia of cervix (CIN III) 08/31/2017   Hx of pyelonephritis 12/09/2012    Past Surgical History:  Procedure Laterality Date   CERVICAL CONE BIOPSY     CERVICAL CONIZATION W/BX  2019   DILATION AND CURETTAGE OF UTERUS  2008   DILATION AND CURETTAGE OF UTERUS N/A 09/27/2018   Procedure: DILATATION AND CURETTAGE;  Surgeon: Woodroe Mode, MD;   Location: [facility];  Service: Gynecology;  Laterality: N/A;   LEEP  2011   OTHER SURGICAL HISTORY     Pynlodial cyst removed from tail bone 2012   OTHER SURGICAL HISTORY  10/2010   removal of cyst near coxxyx   PILONIDAL CYST EXCISION  10/2010    OB History     Gravida  5   Para  2   Term  2   Preterm  0   AB  3   Living  2      SAB  2   IAB  1   Ectopic  0   Multiple  0   Live Births  2            Home Medications    Prior to Admission medications   Medication Sig Start Date End Date Taking? Authorizing Provider  amoxicillin-clavulanate (AUGMENTIN) 875-125 MG tablet Take 1 tablet by mouth every 12 (twelve) hours. 05/25/22  Yes Lauretta Sallas K, PA-C  erythromycin ophthalmic ointment Place a 1/2 inch ribbon of ointment into the lower eyelid of left eye twice daily for 1 week 05/25/22  Yes Dawan Farney K, PA-C  Ascorbic Acid (VITAMIN C) 1000 MG tablet Take 1,000 mg by mouth daily.    [provider]  ondansetron (ZOFRAN ODT) 4 MG disintegrating tablet Take 1 tablet (  4 mg total) by mouth every 8 (eight) hours as needed for nausea or vomiting. 02/12/20   Isla Pence, MD  oxymetazoline (AFRIN) 0.05 % nasal spray Place 2 sprays into both nostrils daily as needed for congestion.    [provider]  polyethylene glycol (MIRALAX / GLYCOLAX) 17 g packet Take 17 g by mouth daily. 08/21/19   Mariel Aloe, MD  etonogestrel (NEXPLANON) 68 MG IMPL implant 1 each by Subdermal route once. Patient not taking: Reported on 02/12/2020  02/13/20  [provider]    Family History Family History  Problem Relation Age of Onset   Hypertension Brother    Cancer Maternal Aunt        breast    Social History Social History   Tobacco Use   Smoking status: Former    Packs/day: 0.10    Types: Cigarettes    Quit date: 07/14/2014    Years since quitting: 7.8   Smokeless tobacco: Never  Vaping Use   Vaping Use: Never used  Substance Use Topics   Alcohol  use: No   Drug use: No     Allergies   Trimethoprim, Codeine, and Septra [bactrim]   Review of Systems Review of Systems  Constitutional:  Negative for activity change, appetite change, fatigue and fever.  Eyes:  Negative for photophobia, pain, discharge, redness, itching and visual disturbance.  Gastrointestinal:  Negative for abdominal pain, diarrhea, nausea and vomiting.  Neurological:  Negative for dizziness, light-headedness and headaches.     Physical Exam Triage Vital Signs ED Triage Vitals  Enc Vitals Group     BP 05/25/22 0834 131/76     Pulse Rate 05/25/22 0834 73     Resp 05/25/22 0834 12     Temp 05/25/22 0834 98.2 F (36.8 C)     Temp Source 05/25/22 0834 Oral     SpO2 05/25/22 0834 98 %     Weight --      Height --      Head Circumference --      Peak Flow --      Pain Score 05/25/22 0832 0     Pain Loc --      Pain Edu? --      Excl. in [place]? --    No data found.  Updated Vital Signs BP 131/76 (BP Location: Left Arm)   Pulse 73   Temp 98.2 F (36.8 C) (Oral)   Resp 12   SpO2 98%   Visual Acuity Right Eye Distance: 20/20 Left Eye Distance: 20/20 Bilateral Distance: 20/20  Right Eye Near:   Left Eye Near:    Bilateral Near:     Physical Exam Vitals reviewed.  Constitutional:      General: She is awake. She is not in acute distress.    Appearance: Normal appearance. She is well-developed. She is not ill-appearing.     Comments: Very pleasant female appears stated age in no acute distress sitting comfortably in exam room  HENT:     Head: Normocephalic and atraumatic.  Eyes:     General:        Right eye: No hordeolum.        Left eye: Hordeolum present.    Extraocular Movements: Extraocular movements intact.     Conjunctiva/sclera: Conjunctivae normal.     Right eye: Right conjunctiva is not injected. No chemosis.    Left eye: Left conjunctiva is not injected. No chemosis.    Pupils: Pupils are equal, round, and  reactive to light.   Cardiovascular:     Rate and Rhythm: Normal rate and regular rhythm.     Heart sounds: Normal heart sounds, S1 normal and S2 normal. No murmur heard. Pulmonary:     Effort: Pulmonary effort is normal.     Breath sounds: Normal breath sounds. No wheezing, rhonchi or rales.     Comments: Clear to auscultation bilaterally Psychiatric:        Behavior: Behavior is cooperative.      UC Treatments / Results  Labs (all labs ordered are listed, but only abnormal results are displayed) Labs Reviewed - No data to display  EKG   Radiology No results found.  Procedures Procedures (including critical care time)  Medications Ordered in UC Medications - No data to display  Initial Impression / Assessment and Plan / UC Course  I have reviewed the triage vital signs and the nursing notes.  Pertinent labs & imaging results that were available during my care of the patient were reviewed by me and considered in my medical decision making (see chart for details).     Patient is well-appearing, afebrile, nontoxic, nontachycardic.  No alarm symptoms or concerning physical exam findings to warrant emergent evaluation.  Discussed that symptoms are likely related to hordeolum given she initially had pain and will treat with antibiotics given prolonged symptoms.  She was encouraged to use warm compresses multiple times a day.  She is to use erythromycin ointment twice daily we discussed that she should avoid touching tip of medication bottle to the eye and make sure to wash hands prior to handling medication in order to prevent contamination of the medicine.  Given she is no longer having pain it is possible this is a chalazion.  Discussed that if her symptoms not responding to medication or if anything changes/worsens she needs to be seen by ophthalmology and was given contact information of a local provider with instruction to call to schedule an appointment.  If she has any worsening symptoms  including eye pain, visual disturbance, fever, nausea/vomiting, pain with extraocular movements she needs to be seen immediately to which she expressed understanding.  Strict return precautions given.  Final Clinical Impressions(s) / UC Diagnoses   Final diagnoses:  Hordeolum externum of left upper eyelid     Discharge Instructions      Start Augmentin twice daily for 7 days.  Continue with warm compresses.  Use erythromycin ointment twice daily.  Do not touch tip of medication bottle to your eye and make sure to wash your hands prior to handling medication in order to prevent contamination of the medicine.  If your symptoms do not improving quickly please follow-up with ophthalmology; call to schedule an appointment.  If anything changes or worsens and you have increasing pain, vision change, pain when you move your eyes, fever, nausea, vomiting you need to be seen immediately.     ED Prescriptions     Medication Sig Dispense Auth. Provider   erythromycin ophthalmic ointment Place a 1/2 inch ribbon of ointment into the lower eyelid of left eye twice daily for 1 week 3.5 g Raylynne Cubbage K, PA-C   amoxicillin-clavulanate (AUGMENTIN) 875-125 MG tablet Take 1 tablet by mouth every 12 (twelve) hours. 14 tablet Laurianne Floresca, Derry Skill, PA-C      PDMP not reviewed this encounter.   Terrilee Croak, PA-C 05/25/22 [id number]

## 2022-05-25 NOTE — ED Triage Notes (Signed)
Pt is here for stye on left eye x 3wks

## 2022-05-25 NOTE — Discharge Instructions (Signed)
Start Augmentin twice daily for 7 days.  Continue with warm compresses.  Use erythromycin ointment twice daily.  Do not touch tip of medication bottle to your eye and make sure to wash your hands prior to handling medication in order to prevent contamination of the medicine.  If your symptoms do not improving quickly please follow-up with ophthalmology; call to schedule an appointment.  If anything changes or worsens and you have increasing pain, vision change, pain when you move your eyes, fever, nausea, vomiting you need to be seen immediately.

## 2022-07-07 ENCOUNTER — Encounter (HOSPITAL_COMMUNITY): Payer: Self-pay

## 2022-07-07 ENCOUNTER — Other Ambulatory Visit: Payer: Self-pay

## 2022-07-07 ENCOUNTER — Emergency Department (HOSPITAL_COMMUNITY)
Admission: EM | Admit: 2022-07-07 | Discharge: 2022-07-07 | Disposition: A | Discharge status: Home / Self Care | Payer: No Typology Code available for payment source | Attending: Emergency Medicine | Admitting: Emergency Medicine

## 2022-07-07 ENCOUNTER — Emergency Department (HOSPITAL_COMMUNITY): Payer: No Typology Code available for payment source

## 2022-07-07 ENCOUNTER — Ambulatory Visit (HOSPITAL_COMMUNITY)
Admission: EM | Admit: 2022-07-07 | Discharge: 2022-07-07 | Disposition: A | Discharge status: Home / Self Care | Payer: No Typology Code available for payment source | Attending: Family Medicine | Admitting: Family Medicine

## 2022-07-07 ENCOUNTER — Other Ambulatory Visit (HOSPITAL_COMMUNITY): Admission: RE | Admit: 2022-07-07 | Payer: 59 | Source: Ambulatory Visit

## 2022-07-07 DIAGNOSIS — R1031 Right lower quadrant pain: Secondary | ICD-10-CM

## 2022-07-07 DIAGNOSIS — K529 Noninfective gastroenteritis and colitis, unspecified: Secondary | ICD-10-CM | POA: Insufficient documentation

## 2022-07-07 LAB — COMPREHENSIVE METABOLIC PANEL
ALT: 17 U/L (ref 0–44)
AST: 21 U/L (ref 15–41)
Albumin: 4.2 g/dL (ref 3.5–5.0)
Alkaline Phosphatase: 42 U/L (ref 38–126)
Anion gap: 9 (ref 5–15)
BUN: 9 mg/dL (ref 6–20)
CO2: 24 mmol/L (ref 22–32)
Calcium: 9.2 mg/dL (ref 8.9–10.3)
Chloride: 106 mmol/L (ref 98–111)
Creatinine, Ser: 0.84 mg/dL (ref 0.44–1.00)
GFR, Estimated: >60 mL/min (ref >60)
Glucose, Bld: 90 mg/dL (ref 70–99)
Potassium: 4.5 mmol/L (ref 3.5–5.1)
Sodium: 139 mmol/L (ref 135–145)
Total Bilirubin: 0.5 mg/dL (ref 0.3–1.2)
Total Protein: 7.1 g/dL (ref 6.5–8.1)

## 2022-07-07 LAB — CBC WITH DIFFERENTIAL/PLATELET
Abs Immature Granulocytes: 0.02 10*3/uL (ref 0.00–0.07)
Basophils Absolute: 0 10*3/uL (ref 0.0–0.1)
Basophils Relative: 1 %
Eosinophils Absolute: 0 10*3/uL (ref 0.0–0.5)
Eosinophils Relative: 0 %
HCT: 39.7 % (ref 36.0–46.0)
Hemoglobin: 13.2 g/dL (ref 12.0–15.0)
Immature Granulocytes: 0 %
Lymphocytes Relative: 33 %
Lymphs Abs: 2 10*3/uL (ref 0.7–4.0)
MCH: 29.6 pg (ref 26.0–34.0)
MCHC: 33.2 g/dL (ref 30.0–36.0)
MCV: 89 fL (ref 80.0–100.0)
Monocytes Absolute: 0.2 10*3/uL (ref 0.1–1.0)
Monocytes Relative: 4 %
Neutro Abs: 3.8 10*3/uL (ref 1.7–7.7)
Neutrophils Relative %: 62 %
Platelets: 270 10*3/uL (ref 150–400)
RBC: 4.46 MIL/uL (ref 3.87–5.11)
RDW: 13.2 % (ref 11.5–15.5)
WBC: 6.1 10*3/uL (ref 4.0–10.5)
nRBC: 0 % (ref 0.0–0.2)

## 2022-07-07 LAB — URINALYSIS, ROUTINE W REFLEX MICROSCOPIC
Bilirubin Urine: NEGATIVE
Glucose, UA: NEGATIVE mg/dL
Hgb urine dipstick: NEGATIVE
Ketones, ur: NEGATIVE mg/dL
Leukocytes,Ua: NEGATIVE
Nitrite: NEGATIVE
Protein, ur: NEGATIVE mg/dL
Specific Gravity, Urine: 1.011 (ref 1.005–1.030)
pH: 8 (ref 5.0–8.0)

## 2022-07-07 LAB — POCT URINALYSIS DIPSTICK, ED / UC
Bilirubin Urine: NEGATIVE
Glucose, UA: NEGATIVE mg/dL
Ketones, ur: NEGATIVE mg/dL
Leukocytes,Ua: NEGATIVE
Nitrite: NEGATIVE
Protein, ur: NEGATIVE mg/dL
Specific Gravity, Urine: 1.02 (ref 1.005–1.030)
Urobilinogen, UA: 0.2 mg/dL (ref 0.0–1.0)
pH: 6 (ref 5.0–8.0)

## 2022-07-07 LAB — WET PREP, GENITAL
Clue Cells Wet Prep HPF POC: NONE SEEN
Sperm: NONE SEEN
Trich, Wet Prep: NONE SEEN
WBC, Wet Prep HPF POC: >=10 — AB (ref <10)
Yeast Wet Prep HPF POC: NONE SEEN

## 2022-07-07 LAB — POC URINE PREG, ED
Preg Test, Ur: NEGATIVE
Preg Test, Ur: NEGATIVE

## 2022-07-07 LAB — LIPASE, BLOOD: Lipase: 31 U/L (ref 11–51)

## 2022-07-07 LAB — HIV ANTIBODY (ROUTINE TESTING W REFLEX): HIV Screen 4th Generation wRfx: NONREACTIVE

## 2022-07-07 MED ORDER — MORPHINE SULFATE (PF) 4 MG/ML IV SOLN
4.0000 mg | Freq: Once | INTRAVENOUS | Status: AC
  Administered 2022-07-07: 4 mg via INTRAVENOUS
  Filled 2022-07-07: qty 1

## 2022-07-07 MED ORDER — ONDANSETRON HCL 4 MG PO TABS: 4.0000 mg | ORAL_TABLET | Freq: Four times a day (QID) | ORAL | 0 refills | Status: DC

## 2022-07-07 MED ORDER — IBUPROFEN 600 MG PO TABS: 600.0000 mg | ORAL_TABLET | Freq: Four times a day (QID) | ORAL | 0 refills | Status: DC | PRN

## 2022-07-07 MED ORDER — IOHEXOL 300 MG/ML  SOLN
100.0000 mL | Freq: Once | INTRAMUSCULAR | Status: AC | PRN
  Administered 2022-07-07: 100 mL via INTRAVENOUS

## 2022-07-07 MED ORDER — ONDANSETRON HCL 4 MG/2ML IJ SOLN
4.0000 mg | Freq: Once | INTRAMUSCULAR | Status: AC
  Administered 2022-07-07: 4 mg via INTRAVENOUS
  Filled 2022-07-07: qty 2

## 2022-07-07 NOTE — Discharge Instructions (Addendum)
You have been evaluated for your symptoms.  Fortunately CT scan and your labs today did not show any concerning finding.  Your symptoms likely due to colitis which is a viral infection causing nausea vomit diarrhea.  You may take Zofran as needed for nausea and take ibuprofen as needed for pain.  Stay hydrated and drink plenty of fluid.  Return to the ER if your symptoms worsen or if you have other concerns.

## 2022-07-07 NOTE — ED Notes (Signed)
Patient is being discharged from the Urgent Care and sent to the Emergency Department via private vehicle . Per DR Nancy Fetter, patient is in need of higher level of care due to abdominal pain. Patient is aware and verbalizes understanding of plan of care.  Vitals:   07/07/22 0820 07/07/22 0821  BP: 113/73   Pulse: 73   Resp: 16   Temp: 97.8 F (36.6 C)   SpO2: 98% 98%

## 2022-07-07 NOTE — ED Triage Notes (Signed)
Pt states generalized abdominal pain with N/V/D started this am.

## 2022-07-07 NOTE — ED Provider Notes (Signed)
[facility] Provider Note   CSN: [id number] Arrival date & time: 07/07/22  0920     History  Chief Complaint  Patient presents with   Abdominal Pain    Margaret Cameron is a 42 y.o. female.  The history is provided by the patient and medical records. No language interpreter was used.  Abdominal Pain    42 year old female is in history sickle cell trait, prior kidney infection, migraine, who presents with complaints of abdominal pain.  Patient was sent here from urgent care center.  The patient developed right lower quadrant pain which she describes as sharp cramping sensation that started earlier this morning.  She also endorsed having bouts of nausea vomiting and diarrhea accompanied with her symptoms.  Pain is worse with movement with palpation.  Report pain is moderate in severity.  No fever or chills no chest pain or shortness of breath.  She denies any urinary symptoms.  She did notice some mild vaginal discharge for the past week.  She also endorsed having an abscess to her left axillary region that has burst it and she is currently taking Tylenol for it.  She has an intact appendix.  Last menstrual period was 2/5.  No new sexual partner.  Some mild discomfort with sexual activity.  Home Medications Prior to Admission medications   Medication Sig Start Date End Date Taking? Authorizing Provider  amoxicillin-clavulanate (AUGMENTIN) 875-125 MG tablet Take 1 tablet by mouth every 12 (twelve) hours. 05/25/22   Raspet, Derry Skill, PA-C  Ascorbic Acid (VITAMIN C) 1000 MG tablet Take 1,000 mg by mouth daily.    [provider]  erythromycin ophthalmic ointment Place a 1/2 inch ribbon of ointment into the lower eyelid of left eye twice daily for 1 week 05/25/22   Raspet, Erin K, PA-C  ondansetron (ZOFRAN ODT) 4 MG disintegrating tablet Take 1 tablet (4 mg total) by mouth every 8 (eight) hours as needed for nausea or vomiting. 02/12/20    Isla Pence, MD  oxymetazoline (AFRIN) 0.05 % nasal spray Place 2 sprays into both nostrils daily as needed for congestion.    [provider]  polyethylene glycol (MIRALAX / GLYCOLAX) 17 g packet Take 17 g by mouth daily. 08/21/19   Mariel Aloe, MD  etonogestrel (NEXPLANON) 68 MG IMPL implant 1 each by Subdermal route once. Patient not taking: Reported on 02/12/2020  02/13/20  [provider]      Allergies    Trimethoprim, Codeine, and Septra [bactrim]    Review of Systems   Review of Systems  Gastrointestinal:  Positive for abdominal pain.  All other systems reviewed and are negative.   Physical Exam Updated Vital Signs BP 122/77 (BP Location: Left Arm)   Pulse 86   Temp 97.9 F (36.6 C) (Oral)   Resp 16   Wt 79 kg   LMP 06/27/2022 (Exact Date)   SpO2 100%   BMI 28.11 kg/m  Physical Exam Vitals and nursing note reviewed.  Constitutional:      Appearance: She is well-developed.     Comments: Patient is laying in a lateral decubitus position appears uncomfortable.  HENT:     Head: Atraumatic.  Eyes:     Conjunctiva/sclera: Conjunctivae normal.  Cardiovascular:     Rate and Rhythm: Normal rate and regular rhythm.  Pulmonary:     Effort: Pulmonary effort is normal.  Abdominal:     General: Abdomen is flat.     Palpations:  Abdomen is soft.     Tenderness: There is abdominal tenderness in the right lower quadrant. There is no guarding. Positive signs include McBurney's sign. Negative signs include Murphy's sign.     Hernia: No hernia is present.  Musculoskeletal:     Cervical back: Neck supple.  Skin:    Findings: No rash.  Neurological:     Mental Status: She is alert.  Psychiatric:        Mood and Affect: Mood normal.     ED Results / Procedures / Treatments   Labs (all labs ordered are listed, but only abnormal results are displayed) Labs Reviewed  WET PREP, GENITAL - Abnormal; Notable for the following components:      Result Value    WBC, Wet Prep HPF POC >=10 (*)    All other components within normal limits  CBC WITH DIFFERENTIAL/PLATELET  COMPREHENSIVE METABOLIC PANEL  LIPASE, BLOOD  URINALYSIS, ROUTINE W REFLEX MICROSCOPIC  RPR  HIV ANTIBODY (ROUTINE TESTING W REFLEX)  POC URINE PREG, ED  GC/CHLAMYDIA PROBE AMP ([place]) NOT AT [facility]    EKG None  Radiology CT ABDOMEN PELVIS W CONTRAST  Result Date: 07/07/2022 CLINICAL DATA:  Right lower quadrant abdominal pain. EXAM: CT ABDOMEN AND PELVIS WITH CONTRAST TECHNIQUE: Multidetector CT imaging of the abdomen and pelvis was performed using the standard protocol following bolus administration of intravenous contrast. RADIATION DOSE REDUCTION: This exam was performed according to the departmental dose-optimization program which includes automated exposure control, adjustment of the mA and/or kV according to patient size and/or use of iterative reconstruction technique. CONTRAST:  135m OMNIPAQUE IOHEXOL 300 MG/ML  SOLN COMPARISON:  CT February 05, 2021 FINDINGS: Lower chest: Lung base atelectasis. Hepatobiliary: No suspicious hepatic lesion. Gallbladder is unremarkable. No biliary ductal dilation. Pancreas: No pancreatic ductal dilation or evidence of acute inflammation. Spleen: No splenomegaly. Adrenals/Urinary Tract: Bilateral adrenal glands appear normal. No hydronephrosis. Kidneys demonstrate symmetric enhancement. Urinary bladder is unremarkable for degree of distension Stomach/Bowel: Stomach is unremarkable for degree of distension. No pathologic dilation of small or large bowel. Normal appendix. Colonic diverticulosis without evidence of acute diverticulitis. Questionable mild wall thickening of the mid transverse through sigmoid colon. Vascular/Lymphatic: Normal caliber abdominal aorta. Smooth IVC contours. No pathologically enlarged abdominal or pelvic lymph nodes. Reproductive: Leiomyomatous uterus. Physiologic/benign corpus luteal cyst measuring 18 mm in the left  ovary requires no independent imaging follow-up. Other: Trace pelvic free fluid is within physiologic normal limits. Musculoskeletal: No acute osseous abnormality. Multilevel degenerative changes spine. IMPRESSION: 1. Questionable mild wall thickening of the mid transverse through sigmoid colon, which may be due to underdistention versus a mild colitis. 2. Colonic diverticulosis without evidence of acute diverticulitis. 3. Leiomyomatous uterus. Electronically Signed   By: JDahlia BailiffM.D.   On: 07/07/2022 13:33    Procedures Pelvic exam  Date/Time: 07/07/2022 11:25 AM  Performed by: TDomenic Moras PA-C Authorized by: TDomenic Moras PA-C  Comments: RRollene Fare NT, available chaperone.  No inguinal lymphadenopathy or inguinal hernia noted.  Normal external genitalia.  Mild discomfort with speculum insertion.  Small amount of dry bloody discharge noted in vaginal vault.  Close cervical os free of lesion or rash.  On bimanual exam mild right adnexal tenderness without cervical motion tenderness.  No obvious mass appreciated.       Medications Ordered in ED Medications  morphine (PF) 4 MG/ML injection 4 mg (4 mg Intravenous Given 07/07/22 1057)  ondansetron (ZOFRAN) injection 4 mg (4 mg Intravenous Given 07/07/22 1057)  morphine (  PF) 4 MG/ML injection 4 mg (4 mg Intravenous Given 07/07/22 1203)  iohexol (OMNIPAQUE) 300 MG/ML solution 100 mL (100 mLs Intravenous Contrast Given 07/07/22 1312)    ED Course/ Medical Decision Making/ A&P                             Medical Decision Making Amount and/or Complexity of Data Reviewed Labs: ordered. Radiology: ordered.  Risk Prescription drug management.   BP 122/77 (BP Location: Left Arm)   Pulse 86   Temp 97.9 F (36.6 C) (Oral)   Resp 16   Wt 79 kg   LMP 06/27/2022 (Exact Date)   SpO2 100%   BMI 28.11 kg/m   19:68 AM 42 year old female is in history sickle cell trait, prior kidney infection, migraine, who presents with complaints of  abdominal pain.  Patient was sent here from urgent care center.  The patient developed right lower quadrant pain which she describes as sharp cramping sensation that started earlier this morning.  She also endorsed having bouts of nausea vomiting and diarrhea accompanied with her symptoms.  Pain is worse with movement with palpation.  Report pain is moderate in severity.  No fever or chills no chest pain or shortness of breath.  She denies any urinary symptoms.  She did notice some mild vaginal discharge for the past week.  She also endorsed having an abscess to her left axillary region that has burst it and she is currently taking Tylenol for it.  She has an intact appendix.  Last menstrual period was 2/5.  On exam, patient is laying across the bed in a lateral decubitus position appears uncomfortable and holding the right side of her abdomen.  Of normal rate and rhythm, lungs are clear to auscultation bilaterally abdomen is soft but tenderness noted to right lower quadrant at the McBurney's point.  No guarding or rebound tenderness.  Negative Murphy sign.  Workup initiated.  -Labs ordered, independently viewed and interpreted by me.  Labs remarkable for normal wet prep, preg test negative, normal WBC, normal H&H, normal electrolytes panel, normal UA -The patient was maintained on a cardiac monitor.  I personally viewed and interpreted the cardiac monitored which showed an underlying rhythm of: NSR -Imaging independently viewed and interpreted by me and I agree with radiologist's interpretation.  Result remarkable for abd/pelvis CT showing mild colitis -This patient presents to the ED for concern of n/v/d, this involves an extensive number of treatment options, and is a complaint that carries with it a high risk of complications and morbidity.  The differential diagnosis includes viral GI, appendicitis, colitis, cholecystitis, UTI -Co morbidities that complicate the patient evaluation includes hx of  colitis -Treatment includes morphine, zofran -Reevaluation of the patient after these medicines showed that the patient improved -PCP office notes or outside notes reviewed -Escalation to admission/observation considered: patients feels much better, is comfortable with discharge, and will follow up with PCP -Prescription medication considered, patient comfortable with zofran -Social Determinant of Health considered which includes tobacco use  2:14 PM [place] labs and CT scan without any concerning finding.  May be some evidence of mild colitis which seems fitting with her presentation.  Patient report improvement of symptoms after receiving treatment.  I discussed care with patient, will discharge home with supportive care and return precaution given.  Patient voiced understanding and agrees with plan.  Work note provided.         Final Clinical Impression(s) /  ED Diagnoses Final diagnoses:  None    Rx / DC Orders ED Discharge Orders     None         Domenic Moras, PA-C 07/07/22 1418    Pattricia Boss, MD 07/09/22 [phone]

## 2022-07-07 NOTE — ED Provider Notes (Signed)
[facility]    CSN: [id number] Arrival date & time: 07/07/22  0801      History   Chief Complaint Chief Complaint  Patient presents with   Abdominal Pain    HPI Margaret Cameron is a 41 y.o. female.    Abdominal Pain Associated symptoms: diarrhea, nausea, vaginal discharge and vomiting   Associated symptoms: no chills, no dysuria and no fever    Patient presents with sudden onset right lower quadrant abdominal pain that started early this morning around 3:00 AM.  She has had numerous episodes of diarrhea since then.  She has had nausea and 1 episode of vomiting. Denies fever, chills. Denies urinary frequency, dysuria.  She has had some yellowish vaginal discharge ongoing for few weeks.  She is married, denies any new sexual partners. She has had cervical cone biopsy but otherwise no other abdominal surgeries.  Still has her appendix. Denies recent travel, suspicious foods eaten, sick contacts.  Past Medical History:  Diagnosis Date   Abnormal Pap smear of cervix    LEEP 2011   Anemia    with pregnancy   Chronic headaches    Cyst near coccyx    Hypoglycemia    Migraine    Pilonidal cyst    Pyelonephritis    x 3 episodes   Sickle cell trait [facility])     Patient Active Problem List   Diagnosis Date Noted   Salpingitis 02/06/2021   TOA (tubo-ovarian abscess) 02/06/2021   Abdominal pain 08/18/2019   Emesis 08/18/2019   Hematochezia 08/18/2019   Colitis 08/18/2019   Cervical stenosis (uterine cervix) 09/27/2018   Hematometra 09/27/2018   Severe dysplasia of cervix (CIN III) 08/31/2017   Hx of pyelonephritis 12/09/2012    Past Surgical History:  Procedure Laterality Date   CERVICAL CONE BIOPSY     CERVICAL CONIZATION W/BX  2019   DILATION AND CURETTAGE OF UTERUS  2008   DILATION AND CURETTAGE OF UTERUS N/A 09/27/2018   Procedure: DILATATION AND CURETTAGE;  Surgeon: Woodroe Mode, MD;  Location: [facility];  Service: Gynecology;  Laterality: N/A;    LEEP  2011   OTHER SURGICAL HISTORY     Pynlodial cyst removed from tail bone 2012   OTHER SURGICAL HISTORY  10/2010   removal of cyst near coxxyx   PILONIDAL CYST EXCISION  10/2010    OB History     Gravida  5   Para  2   Term  2   Preterm  0   AB  3   Living  2      SAB  2   IAB  1   Ectopic  0   Multiple  0   Live Births  2            Home Medications    Prior to Admission medications   Medication Sig Start Date End Date Taking? Authorizing Provider  amoxicillin-clavulanate (AUGMENTIN) 875-125 MG tablet Take 1 tablet by mouth every 12 (twelve) hours. 05/25/22   Raspet, Derry Skill, PA-C  Ascorbic Acid (VITAMIN C) 1000 MG tablet Take 1,000 mg by mouth daily.    [provider]  erythromycin ophthalmic ointment Place a 1/2 inch ribbon of ointment into the lower eyelid of left eye twice daily for 1 week 05/25/22   Raspet, Erin K, PA-C  ondansetron (ZOFRAN ODT) 4 MG disintegrating tablet Take 1 tablet (4 mg total) by mouth every 8 (eight) hours as needed for nausea or vomiting. 02/12/20  Isla Pence, MD  oxymetazoline (AFRIN) 0.05 % nasal spray Place 2 sprays into both nostrils daily as needed for congestion.    [provider]  polyethylene glycol (MIRALAX / GLYCOLAX) 17 g packet Take 17 g by mouth daily. 08/21/19   Mariel Aloe, MD  etonogestrel (NEXPLANON) 68 MG IMPL implant 1 each by Subdermal route once. Patient not taking: Reported on 02/12/2020  02/13/20  [provider]    Family History Family History  Problem Relation Age of Onset   Hypertension Brother    Cancer Maternal Aunt        breast    Social History Social History   Tobacco Use   Smoking status: Former    Packs/day: 0.10    Types: Cigarettes    Quit date: 07/14/2014    Years since quitting: 7.9   Smokeless tobacco: Never  Vaping Use   Vaping Use: Never used  Substance Use Topics   Alcohol use: No   Drug use: No     Allergies   Trimethoprim,  Codeine, and Septra [bactrim]   Review of Systems Review of Systems  Constitutional:  Negative for chills and fever.  Gastrointestinal:  Positive for abdominal pain, diarrhea, nausea and vomiting.  Genitourinary:  Positive for vaginal discharge. Negative for dysuria and frequency.     Physical Exam Triage Vital Signs ED Triage Vitals [07/07/22 0820]  Enc Vitals Group     BP 113/73     Pulse Rate 73     Resp 16     Temp 97.8 F (36.6 C)     Temp Source Oral     SpO2 98 %     Weight      Height      Head Circumference      Peak Flow      Pain Score 9     Pain Loc      Pain Edu?      Excl. in [place]?    No data found.  Updated Vital Signs BP 113/73 (BP Location: Right Arm)   Pulse 73   Temp 97.8 F (36.6 C) (Oral)   Resp 16   LMP 06/27/2022 (Exact Date)   SpO2 98%   Visual Acuity Right Eye Distance:   Left Eye Distance:   Bilateral Distance:    Right Eye Near:   Left Eye Near:    Bilateral Near:     Physical Exam Constitutional:      Comments: Patient appears to be in significant pain, standing hunched over, appears to be in some distress  HENT:     Head: Normocephalic and atraumatic.  Cardiovascular:     Rate and Rhythm: Normal rate and regular rhythm.     Heart sounds: Normal heart sounds. No murmur heard. Pulmonary:     Effort: Pulmonary effort is normal. No respiratory distress.     Breath sounds: Normal breath sounds.  Abdominal:     Palpations: Abdomen is soft.     Tenderness: There is abdominal tenderness in the right lower quadrant. There is no guarding or rebound.     Comments: Tenderness to palpation noted in the suprapubic and RLQ region  Musculoskeletal:     Cervical back: Neck supple.  Neurological:     Mental Status: She is alert.      UC Treatments / Results  Labs (all labs ordered are listed, but only abnormal results are displayed) Labs Reviewed  POCT URINALYSIS DIPSTICK, ED / UC - Abnormal; Notable for  the following components:       Result Value   Hgb urine dipstick SMALL (*)    All other components within normal limits  POC URINE PREG, ED    EKG   Radiology No results found.  Procedures Procedures (including critical care time)  Medications Ordered in UC Medications - No data to display  Initial Impression / Assessment and Plan / UC Course  I have reviewed the triage vital signs and the nursing notes.  Pertinent labs & imaging results that were available during my care of the patient were reviewed by me and considered in my medical decision making (see chart for details).    42 year old female presenting with sudden onset RLQ abdominal pain associated with profuse diarrhea, nausea, and 1 episode of vomiting.  This could be possibly a severe case of viral gastroenteritis but given the significant mount of pain that she is in, I am worried about possible appendicitis or possible ovarian pathology and I think she will need more evaluation in the ED possibly CT scan and/or pelvic ultrasound for further workup.  Directed patient to the ED and she will proceed there now.  Final Clinical Impressions(s) / UC Diagnoses   Final diagnoses:  Right lower quadrant abdominal pain     Discharge Instructions      Go to the ED for further evaluation of your abdominal pain.  I am worried about possible appendicitis or something related to your ovaries which I think you will need further workup that we cannot provide here.    ED Prescriptions   None    PDMP not reviewed this encounter.   Zola Button, MD 07/07/22 [phone]

## 2022-07-07 NOTE — ED Triage Notes (Signed)
Pt sent from UC for c/o RLQ pain with n/v/d x6hrs and vaginal discharge that is yellow and think for weeks Denies urinary complaints.  Pt reports cervical cyst that was prescribed abts for on 05/25/2022

## 2022-07-07 NOTE — Discharge Instructions (Addendum)
Go to the ED for further evaluation of your abdominal pain.  I am worried about possible appendicitis or something related to your ovaries which I think you will need further workup that we cannot provide here.

## 2022-07-08 LAB — GC/CHLAMYDIA PROBE AMP (~~LOC~~) NOT AT ARMC
Chlamydia: NEGATIVE
Comment: NEGATIVE
Comment: NORMAL
Neisseria Gonorrhea: NEGATIVE

## 2022-07-08 LAB — RPR: RPR Ser Ql: NONREACTIVE

## 2022-12-15 ENCOUNTER — Encounter (HOSPITAL_COMMUNITY): Payer: Self-pay | Admitting: Emergency Medicine

## 2022-12-15 ENCOUNTER — Ambulatory Visit (HOSPITAL_COMMUNITY)
Admission: EM | Admit: 2022-12-15 | Discharge: 2022-12-15 | Disposition: A | Discharge status: Home / Self Care | Payer: No Typology Code available for payment source | Attending: Internal Medicine | Admitting: Internal Medicine

## 2022-12-15 DIAGNOSIS — L02411 Cutaneous abscess of right axilla: Secondary | ICD-10-CM

## 2022-12-15 MED ORDER — DOXYCYCLINE HYCLATE 100 MG PO CAPS: 100.0000 mg | ORAL_CAPSULE | Freq: Two times a day (BID) | ORAL | 0 refills | Status: DC

## 2022-12-15 NOTE — ED Provider Notes (Signed)
[facility]    CSN: [id number] Arrival date & time: 12/15/22  0806      History   Chief Complaint Chief Complaint  Patient presents with   Abscess    HPI Margaret Cameron is a 42 y.o. female.   Patient presents to urgent care for evaluation of beginning signs of abscess to the right axillary region that started 2 to 3 days ago.  She states she has began to notice some redness, swelling, and tenderness to the right axillary region.  Denies history of hidradenitis suppurativa but states that she gets abscesses like these frequently to the axillary regions.  Denies recent antibiotic/to reduce, fever/chills, nausea, vomiting, body aches, and numbness or tingling to the right arm.      Past Medical History:  Diagnosis Date   Abnormal Pap smear of cervix    LEEP 2011   Anemia    with pregnancy   Chronic headaches    Cyst near coccyx    Hypoglycemia    Migraine    Pilonidal cyst    Pyelonephritis    x 3 episodes   Sickle cell trait [facility])     Patient Active Problem List   Diagnosis Date Noted   Salpingitis 02/06/2021   TOA (tubo-ovarian abscess) 02/06/2021   Abdominal pain 08/18/2019   Emesis 08/18/2019   Hematochezia 08/18/2019   Colitis 08/18/2019   Cervical stenosis (uterine cervix) 09/27/2018   Hematometra 09/27/2018   Severe dysplasia of cervix (CIN III) 08/31/2017   Hx of pyelonephritis 12/09/2012    Past Surgical History:  Procedure Laterality Date   CERVICAL CONE BIOPSY     CERVICAL CONIZATION W/BX  2019   DILATION AND CURETTAGE OF UTERUS  2008   DILATION AND CURETTAGE OF UTERUS N/A 09/27/2018   Procedure: DILATATION AND CURETTAGE;  Surgeon: Adam Phenix, MD;  Location: [facility];  Service: Gynecology;  Laterality: N/A;   LEEP  2011   OTHER SURGICAL HISTORY     Pynlodial cyst removed from tail bone 2012   OTHER SURGICAL HISTORY  10/2010   removal of cyst near coxxyx   PILONIDAL CYST EXCISION  10/2010    OB History     Gravida   5   Para  2   Term  2   Preterm  0   AB  3   Living  2      SAB  2   IAB  1   Ectopic  0   Multiple  0   Live Births  2            Home Medications    Prior to Admission medications   Medication Sig Start Date End Date Taking? Authorizing Provider  doxycycline (VIBRAMYCIN) 100 MG capsule Take 1 capsule (100 mg total) by mouth 2 (two) times daily. 12/15/22  Yes Carlisle Beers, FNP  Ascorbic Acid (VITAMIN C) 1000 MG tablet Take 1,000 mg by mouth daily.    [provider]  erythromycin ophthalmic ointment Place a 1/2 inch ribbon of ointment into the lower eyelid of left eye twice daily for 1 week 05/25/22   Raspet, Erin K, PA-C  ibuprofen (ADVIL) 600 MG tablet Take 1 tablet (600 mg total) by mouth every 6 (six) hours as needed. 07/07/22   Fayrene Helper, PA-C  ondansetron (ZOFRAN ODT) 4 MG disintegrating tablet Take 1 tablet (4 mg total) by mouth every 8 (eight) hours as needed for nausea or vomiting. 02/12/20   Haviland,  Raynelle Fanning, MD  ondansetron (ZOFRAN) 4 MG tablet Take 1 tablet (4 mg total) by mouth every 6 (six) hours. 07/07/22   Fayrene Helper, PA-C  oxymetazoline (AFRIN) 0.05 % nasal spray Place 2 sprays into both nostrils daily as needed for congestion.    [provider]  polyethylene glycol (MIRALAX / GLYCOLAX) 17 g packet Take 17 g by mouth daily. 08/21/19   Narda Bonds, MD  etonogestrel (NEXPLANON) 68 MG IMPL implant 1 each by Subdermal route once. Patient not taking: Reported on 02/12/2020  02/13/20  [provider]    Family History Family History  Problem Relation Age of Onset   Hypertension Brother    Cancer Maternal Aunt        breast    Social History Social History   Tobacco Use   Smoking status: Former    Current packs/day: 0.00    Types: Cigarettes    Quit date: 07/14/2014    Years since quitting: 8.4   Smokeless tobacco: Never  Vaping Use   Vaping status: Never Used  Substance Use Topics   Alcohol use: No    Drug use: No     Allergies   Trimethoprim, Codeine, and Septra [bactrim]   Review of Systems Review of Systems Per HPI  Physical Exam Triage Vital Signs ED Triage Vitals  Encounter Vitals Group     BP 12/15/22 0832 104/70     Systolic BP Percentile --      Diastolic BP Percentile --      Pulse Rate 12/15/22 0832 64     Resp 12/15/22 0832 16     Temp 12/15/22 0832 97.9 F (36.6 C)     Temp Source 12/15/22 0832 Oral     SpO2 12/15/22 0832 98 %     Weight --      Height --      Head Circumference --      Peak Flow --      Pain Score 12/15/22 0831 7     Pain Loc --      Pain Education --      Exclude from Growth Chart --    No data found.  Updated Vital Signs BP 104/70 (BP Location: Left Arm)   Pulse 64   Temp 97.9 F (36.6 C) (Oral)   Resp 16   LMP 12/02/2022   SpO2 98%   Visual Acuity Right Eye Distance:   Left Eye Distance:   Bilateral Distance:    Right Eye Near:   Left Eye Near:    Bilateral Near:     Physical Exam Vitals and nursing note reviewed.  Constitutional:      Appearance: She is not ill-appearing or toxic-appearing.  HENT:     Head: Normocephalic and atraumatic.     Right Ear: Hearing and external ear normal.     Left Ear: Hearing and external ear normal.     Nose: Nose normal.     Mouth/Throat:     Lips: Pink.  Eyes:     General: Lids are normal. Vision grossly intact. Gaze aligned appropriately.     Extraocular Movements: Extraocular movements intact.     Conjunctiva/sclera: Conjunctivae normal.  Pulmonary:     Effort: Pulmonary effort is normal.  Musculoskeletal:     Cervical back: Neck supple.  Skin:    General: Skin is warm and dry.     Capillary Refill: Capillary refill takes less than 2 seconds.     Findings: Abscess present. No  rash.          Comments: 2cm x 1cm area of erythema and underlying soft tissue swelling/induration. Warmth present to surrounding wound. See images below.  Neurological:     General: No focal  deficit present.     Mental Status: She is alert and oriented to person, place, and time. Mental status is at baseline.     Cranial Nerves: No dysarthria or facial asymmetry.  Psychiatric:        Mood and Affect: Mood normal.        Speech: Speech normal.        Behavior: Behavior normal.        Thought Content: Thought content normal.        Judgment: Judgment normal.      UC Treatments / Results  Labs (all labs ordered are listed, but only abnormal results are displayed) Labs Reviewed - No data to display  EKG   Radiology No results found.  Procedures Procedures (including critical care time)  Medications Ordered in UC Medications - No data to display  Initial Impression / Assessment and Plan / UC Course  I have reviewed the triage vital signs and the nursing notes.  Pertinent labs & imaging results that were available during my care of the patient were reviewed by me and considered in my medical decision making (see chart for details).   1. Abscess of axilla, right Immature abscess is not ready to drain, however appears infected.  Will treat with doxycycline antibiotic twice daily for the next 10 days.  Warm compresses encouraged.  No signs of systemic symptoms currently.  May follow-up with PCP and/or urgent care to have this drained should this fail to drain on its own in the next few days.   Counseled patient on potential for adverse effects with medications prescribed/recommended today, strict ER and return-to-clinic precautions discussed, patient verbalized understanding.    Final Clinical Impressions(s) / UC Diagnoses   Final diagnoses:  Abscess of axilla, right     Discharge Instructions      Your abscess has been evaluated in the clinic and appears to be infected, but is not quite ready for drainage.   I would like for you to perform warm compresses to the area frequently and continue taking over the counter medications for pain and inflammation as  directed.   Start taking antibiotic sent to pharmacy as directed. This will help reduce infection to area and help the abscess mature/drain.   If abscess does not begin to drain on its own over the next few days and becomes softer, please return to urgent care for this to be drained appropriately.   If you have any fever, chills, nausea, vomiting, or worsening pain/swelling to the area, please return to urgent care for reevaluation.     ED Prescriptions     Medication Sig Dispense Auth. Provider   doxycycline (VIBRAMYCIN) 100 MG capsule Take 1 capsule (100 mg total) by mouth 2 (two) times daily. 20 capsule Carlisle Beers, FNP      PDMP not reviewed this encounter.   Carlisle Beers, [place] 12/15/22 [phone]

## 2022-12-15 NOTE — ED Triage Notes (Signed)
Pt reports has an abscess in right axilla for couple days that is becoming more larger in size and painful. Tried heat but hasn't drained. Took IBU for pain

## 2022-12-15 NOTE — Discharge Instructions (Signed)

## 2023-04-14 ENCOUNTER — Emergency Department (HOSPITAL_COMMUNITY): Payer: No Typology Code available for payment source

## 2023-04-14 ENCOUNTER — Emergency Department (HOSPITAL_COMMUNITY)
Admission: EM | Admit: 2023-04-14 | Discharge: 2023-04-14 | Disposition: A | Discharge status: Home / Self Care | Payer: No Typology Code available for payment source | Attending: Emergency Medicine | Admitting: Emergency Medicine

## 2023-04-14 ENCOUNTER — Encounter (HOSPITAL_COMMUNITY): Payer: Self-pay | Admitting: Emergency Medicine

## 2023-04-14 ENCOUNTER — Other Ambulatory Visit: Payer: Self-pay

## 2023-04-14 DIAGNOSIS — D649 Anemia, unspecified: Secondary | ICD-10-CM | POA: Diagnosis not present

## 2023-04-14 DIAGNOSIS — Z20822 Contact with and (suspected) exposure to covid-19: Secondary | ICD-10-CM | POA: Insufficient documentation

## 2023-04-14 DIAGNOSIS — R059 Cough, unspecified: Secondary | ICD-10-CM | POA: Diagnosis present

## 2023-04-14 DIAGNOSIS — J40 Bronchitis, not specified as acute or chronic: Secondary | ICD-10-CM | POA: Diagnosis not present

## 2023-04-14 LAB — BASIC METABOLIC PANEL
Anion gap: 7 (ref 5–15)
BUN: 11 mg/dL (ref 6–20)
CO2: 22 mmol/L (ref 22–32)
Calcium: 9 mg/dL (ref 8.9–10.3)
Chloride: 109 mmol/L (ref 98–111)
Creatinine, Ser: 0.68 mg/dL (ref 0.44–1.00)
GFR, Estimated: >60 mL/min (ref >60)
Glucose, Bld: 88 mg/dL (ref 70–99)
Potassium: 4 mmol/L (ref 3.5–5.1)
Sodium: 138 mmol/L (ref 135–145)

## 2023-04-14 LAB — TROPONIN I (HIGH SENSITIVITY): Troponin I (High Sensitivity): <2 ng/L (ref <18)

## 2023-04-14 LAB — CBC WITH DIFFERENTIAL/PLATELET
Abs Immature Granulocytes: 0.02 10*3/uL (ref 0.00–0.07)
Basophils Absolute: 0.1 10*3/uL (ref 0.0–0.1)
Basophils Relative: 1 %
Eosinophils Absolute: 0.1 10*3/uL (ref 0.0–0.5)
Eosinophils Relative: 1 %
HCT: 33.7 % — ABNORMAL LOW (ref 36.0–46.0)
Hemoglobin: 11.8 g/dL — ABNORMAL LOW (ref 12.0–15.0)
Immature Granulocytes: 0 %
Lymphocytes Relative: 35 %
Lymphs Abs: 2.3 10*3/uL (ref 0.7–4.0)
MCH: 31 pg (ref 26.0–34.0)
MCHC: 35 g/dL (ref 30.0–36.0)
MCV: 88.5 fL (ref 80.0–100.0)
Monocytes Absolute: 0.4 10*3/uL (ref 0.1–1.0)
Monocytes Relative: 6 %
Neutro Abs: 3.6 10*3/uL (ref 1.7–7.7)
Neutrophils Relative %: 57 %
Platelets: 232 10*3/uL (ref 150–400)
RBC: 3.81 MIL/uL — ABNORMAL LOW (ref 3.87–5.11)
RDW: 13.4 % (ref 11.5–15.5)
WBC: 6.4 10*3/uL (ref 4.0–10.5)
nRBC: 0 % (ref 0.0–0.2)

## 2023-04-14 LAB — RESP PANEL BY RT-PCR (RSV, FLU A&B, COVID)  RVPGX2
Influenza A by PCR: NEGATIVE
Influenza B by PCR: NEGATIVE
Resp Syncytial Virus by PCR: NEGATIVE
SARS Coronavirus 2 by RT PCR: NEGATIVE

## 2023-04-14 LAB — MAGNESIUM: Magnesium: 2.1 mg/dL (ref 1.7–2.4)

## 2023-04-14 MED ORDER — METHYLPREDNISOLONE SODIUM SUCC 125 MG IJ SOLR
125.0000 mg | Freq: Once | INTRAMUSCULAR | Status: AC
  Administered 2023-04-14: 125 mg via INTRAVENOUS
  Filled 2023-04-14: qty 2

## 2023-04-14 MED ORDER — DOXYCYCLINE HYCLATE 100 MG PO CAPS: 100.0000 mg | ORAL_CAPSULE | Freq: Two times a day (BID) | ORAL | 0 refills | Status: DC

## 2023-04-14 MED ORDER — HYDROCODONE BIT-HOMATROP MBR 5-1.5 MG/5ML PO SOLN: 5.0000 mL | Freq: Four times a day (QID) | ORAL | 0 refills | Status: DC | PRN

## 2023-04-14 MED ORDER — SODIUM CHLORIDE (PF) 0.9 % IJ SOLN
INTRAMUSCULAR | Status: AC
  Filled 2023-04-14: qty 50

## 2023-04-14 MED ORDER — IPRATROPIUM-ALBUTEROL 0.5-2.5 (3) MG/3ML IN SOLN
3.0000 mL | Freq: Once | RESPIRATORY_TRACT | Status: AC
  Administered 2023-04-14: 3 mL via RESPIRATORY_TRACT
  Filled 2023-04-14: qty 3

## 2023-04-14 MED ORDER — PREDNISONE 20 MG PO TABS: 20.0000 mg | ORAL_TABLET | Freq: Every day | ORAL | 0 refills | Status: DC

## 2023-04-14 MED ORDER — HYDROCODONE BIT-HOMATROP MBR 5-1.5 MG/5ML PO SOLN
5.0000 mL | Freq: Once | ORAL | Status: AC
  Administered 2023-04-14: 5 mL via ORAL
  Filled 2023-04-14: qty 5

## 2023-04-14 MED ORDER — IOHEXOL 350 MG/ML SOLN
75.0000 mL | Freq: Once | INTRAVENOUS | Status: AC | PRN
  Administered 2023-04-14: 75 mL via INTRAVENOUS

## 2023-04-14 MED ORDER — ALBUTEROL SULFATE HFA 108 (90 BASE) MCG/ACT IN AERS: 1.0000 | INHALATION_SPRAY | Freq: Four times a day (QID) | RESPIRATORY_TRACT | 0 refills | Status: DC | PRN

## 2023-04-14 NOTE — ED Triage Notes (Signed)
Pt reports cough x 3 weeks. Reports she is now having pain in her chest when she breathes in

## 2023-04-14 NOTE — Discharge Instructions (Addendum)
Your workup today showed evidence of bronchitis.  I have sent in a cough syrup that has narcotic medicine and there to help with your coughing spells.  Uses only for severe coughing spells or at bedtime.  This medicine can make you drowsy so do not do anything dangerous after taking this medication.  I recommend that you predominantly take guaifenesin 1200 mg which you can buy over-the-counter for cough.  This will help thin out the mucus to help clear your chest.  I have also sent doxycycline and which is an antibiotic.  I have also sent prednisone into the pharmacy.  This is steroid.  You received an IV steroid dose in the emergency department.  You can start this tomorrow.  If you have any worsening in your symptoms return to the emergency room.  Otherwise I recommend you keep your activity to minimum given that it significantly worsens your cough.  Have also sent albuterol inhaler for you to use when you feel short of breath or have wheezing.  Follow-up with your primary care provider around next Tuesday or Wednesday for reevaluation.  You were given 2 breathing exercise equipment in the emergency department.  1 is called an incentive spirometer to help you take deep breaths to keep this from worsening and developing into a pneumonia.  The other is called a flutter valve which sends vibrations into your chest to help break up the mucus.  Use both as often as you can throughout the day.

## 2023-04-14 NOTE — ED Provider Notes (Signed)
[place] EMERGENCY DEPARTMENT AT [facility] Provider Note   CSN: [id number] Arrival date & time: 04/14/23  0109     History  Chief Complaint  Patient presents with   Cough    Margaret Cameron is a 42 y.o. female.  42 year old presents today for concern of cough that has been ongoing for the past 3 weeks but over the past couple days has associated chest pain that is pleuritic in nature.  States she is unable to take a deep breath with this.  No prior history of blood clots.  No recent long travel, recent surgery, or birth control use.  Denies history of heart problems.  The history is provided by the patient. No language interpreter was used.       Home Medications Prior to Admission medications   Medication Sig Start Date End Date Taking? Authorizing Provider  Ascorbic Acid (VITAMIN C) 1000 MG tablet Take 1,000 mg by mouth daily.    [provider]  doxycycline (VIBRAMYCIN) 100 MG capsule Take 1 capsule (100 mg total) by mouth 2 (two) times daily. 12/15/22   Carlisle Beers, FNP  erythromycin ophthalmic ointment Place a 1/2 inch ribbon of ointment into the lower eyelid of left eye twice daily for 1 week 05/25/22   Raspet, Erin K, PA-C  ibuprofen (ADVIL) 600 MG tablet Take 1 tablet (600 mg total) by mouth every 6 (six) hours as needed. 07/07/22   Fayrene Helper, PA-C  ondansetron (ZOFRAN ODT) 4 MG disintegrating tablet Take 1 tablet (4 mg total) by mouth every 8 (eight) hours as needed for nausea or vomiting. 02/12/20   Jacalyn Lefevre, MD  ondansetron (ZOFRAN) 4 MG tablet Take 1 tablet (4 mg total) by mouth every 6 (six) hours. 07/07/22   Fayrene Helper, PA-C  oxymetazoline (AFRIN) 0.05 % nasal spray Place 2 sprays into both nostrils daily as needed for congestion.    [provider]  polyethylene glycol (MIRALAX / GLYCOLAX) 17 g packet Take 17 g by mouth daily. 08/21/19   Narda Bonds, MD  etonogestrel (NEXPLANON) 68 MG IMPL implant 1 each by  Subdermal route once. Patient not taking: Reported on 02/12/2020  02/13/20  [provider]      Allergies    Trimethoprim, Codeine, and Septra [bactrim]    Review of Systems   Review of Systems  Constitutional:  Negative for chills and fever.  Respiratory:  Positive for cough and shortness of breath.   Cardiovascular:  Positive for chest pain. Negative for palpitations and leg swelling.  Gastrointestinal:  Negative for abdominal pain.  Neurological:  Negative for light-headedness.  All other systems reviewed and are negative.   Physical Exam Updated Vital Signs BP 110/60   Pulse 88   Temp 97.9 F (36.6 C) (Oral)   Resp 20   LMP  (LMP Unknown)   SpO2 99%  Physical Exam Vitals and nursing note reviewed.  Constitutional:      General: She is not in acute distress.    Appearance: Normal appearance. She is not ill-appearing.  HENT:     Head: Normocephalic and atraumatic.     Nose: Nose normal.  Eyes:     General: No scleral icterus.    Extraocular Movements: Extraocular movements intact.     Conjunctiva/sclera: Conjunctivae normal.  Cardiovascular:     Rate and Rhythm: Normal rate and regular rhythm.     Heart sounds: Normal heart sounds.  Pulmonary:     Effort: Pulmonary effort is  normal. No respiratory distress.     Breath sounds: Normal breath sounds. No wheezing or rales.  Musculoskeletal:        General: Normal range of motion.     Cervical back: Normal range of motion.     Right lower leg: No edema.     Left lower leg: No edema.  Skin:    General: Skin is warm and dry.  Neurological:     General: No focal deficit present.     Mental Status: She is alert. Mental status is at baseline.     ED Results / Procedures / Treatments   Labs (all labs ordered are listed, but only abnormal results are displayed) Labs Reviewed  RESP PANEL BY RT-PCR (RSV, FLU A&B, COVID)  RVPGX2  CBC WITH DIFFERENTIAL/PLATELET  BASIC METABOLIC PANEL  MAGNESIUM  TROPONIN I  (HIGH SENSITIVITY)    EKG None  Radiology DG Chest 2 View  Result Date: 04/14/2023 CLINICAL DATA:  Chest pain and cough. EXAM: CHEST - 2 VIEW COMPARISON:  01/19/2021 FINDINGS: Heart size and mediastinal contours are normal. No pleural fluid or interstitial edema. No airspace opacities identified. Visualized osseous structures are unremarkable. IMPRESSION: No active cardiopulmonary disease. Electronically Signed   By: Signa Kell M.D.   On: 04/14/2023 06:19    Procedures Procedures    Medications Ordered in ED Medications  HYDROcodone bit-homatropine (HYCODAN) 5-1.5 MG/5ML syrup 5 mL (has no administration in time range)  ipratropium-albuterol (DUONEB) 0.5-2.5 (3) MG/3ML nebulizer solution 3 mL (3 mLs Nebulization Given 04/14/23 2130)    ED Course/ Medical Decision Making/ A&P                                 Medical Decision Making Amount and/or Complexity of Data Reviewed Labs: ordered. Radiology: ordered.  Risk Prescription drug management.   Medical Decision Making / ED Course   This patient presents to the ED for concern of chest pain, this involves an extensive number of treatment options, and is a complaint that carries with it a high risk of complications and morbidity.  The differential diagnosis includes MSK pain, PE, pneumonia, ACS  MDM: 42 year old female presents today for concern of pleuritic chest pain for the past 2 days.  Appears uncomfortable on exam and has received frequent coughing spells.  Discussion had with patient and she would prefer to have a PE study.  She is otherwise low risk on Wells criteria.  Chest x-ray was done through triage and shows no acute cardiopulmonary process.Marland Kitchen  CBC without leukocytosis.  Mild anemia at 11.8 but no evidence of GI bleed.  Hemodynamically stable.  BMP unremarkable.  Troponin undetectable.  Magnesium 2.1.  COVID, flu, RSV negative.  CT angio chest PE study without evidence of PE but does show evidence of bronchitis  and mucous plugging.  Incentive spirometer, flutter valve provided.  Hycodan given but advised not to use unless she has severe coughing spells or at bedtime otherwise use guaifenesin 1200 mg.  Albuterol inhaler given.  Doxycycline prescribed in addition to prednisone.  Solu-Medrol given. I ambulated patient within the room.  She remained with preserved oxygen saturations without any desats.  Activity does worsen her coughing spells.  Discussed decreasing activity level but using the incentive spirometer and flutter valve strictly.  Strict return precautions given.  Discussed close follow-up with PCP.  She voices understanding and is in agreement with plan.   Lab Tests: -I ordered, reviewed,  and interpreted labs.   The pertinent results include:   Labs Reviewed  CBC WITH DIFFERENTIAL/PLATELET - Abnormal; Notable for the following components:      Result Value   RBC 3.81 (*)    Hemoglobin 11.8 (*)    HCT 33.7 (*)    All other components within normal limits  RESP PANEL BY RT-PCR (RSV, FLU A&B, COVID)  RVPGX2  BASIC METABOLIC PANEL  MAGNESIUM  TROPONIN I (HIGH SENSITIVITY)      EKG  EKG Interpretation Date/Time:    Ventricular Rate:    PR Interval:    QRS Duration:    QT Interval:    QTC Calculation:   R Axis:      Text Interpretation:           Imaging Studies ordered: I ordered imaging studies including cxr, cta chest I independently visualized and interpreted imaging. I agree with the radiologist interpretation   Medicines ordered and prescription drug management: Meds ordered this encounter  Medications   HYDROcodone bit-homatropine (HYCODAN) 5-1.5 MG/5ML syrup 5 mL   ipratropium-albuterol (DUONEB) 0.5-2.5 (3) MG/3ML nebulizer solution 3 mL    -I have reviewed the patients home medicines and have made adjustments as needed   Reevaluation: After the interventions noted above, I reevaluated the patient and found that they have :improved  Co morbidities that  complicate the patient evaluation  Past Medical History:  Diagnosis Date   Abnormal Pap smear of cervix    LEEP 2011   Anemia    with pregnancy   Chronic headaches    Cyst near coccyx    Hypoglycemia    Migraine    Pilonidal cyst    Pyelonephritis    x 3 episodes   Sickle cell trait (HCC)       Dispostion: Discharged in stable condition.  Strict return precaution discussed.  No indication for admission at this time.   Final Clinical Impression(s) / ED Diagnoses Final diagnoses:  Bronchitis    Rx / DC Orders ED Discharge Orders          Ordered    predniSONE (DELTASONE) 20 MG tablet  Daily with breakfast        04/14/23 0933    HYDROcodone bit-homatropine (HYCODAN) 5-1.5 MG/5ML syrup  Every 6 hours PRN        04/14/23 0933    doxycycline (VIBRAMYCIN) 100 MG capsule  2 times daily        04/14/23 0933              Marita Kansas, PA-C 04/14/23 1610    Linwood Dibbles, MD 04/14/23 1558

## 2023-06-09 ENCOUNTER — Encounter (HOSPITAL_COMMUNITY): Payer: Self-pay | Admitting: Emergency Medicine

## 2023-06-09 ENCOUNTER — Other Ambulatory Visit: Payer: Self-pay

## 2023-06-09 ENCOUNTER — Emergency Department (HOSPITAL_COMMUNITY)
Admission: EM | Admit: 2023-06-09 | Discharge: 2023-06-09 | Disposition: A | Discharge status: Home / Self Care | Payer: No Typology Code available for payment source | Attending: Emergency Medicine | Admitting: Emergency Medicine

## 2023-06-09 DIAGNOSIS — L0231 Cutaneous abscess of buttock: Secondary | ICD-10-CM | POA: Diagnosis present

## 2023-06-09 DIAGNOSIS — L03317 Cellulitis of buttock: Secondary | ICD-10-CM | POA: Insufficient documentation

## 2023-06-09 MED ORDER — LIDOCAINE-EPINEPHRINE (PF) 2 %-1:200000 IJ SOLN
20.0000 mL | Freq: Once | INTRAMUSCULAR | Status: DC
  Filled 2023-06-09: qty 20

## 2023-06-09 MED ORDER — DOXYCYCLINE HYCLATE 100 MG PO CAPS: 100.0000 mg | ORAL_CAPSULE | Freq: Two times a day (BID) | ORAL | 0 refills | Status: DC

## 2023-06-09 NOTE — Discharge Instructions (Signed)
You were seen in the emergency department with concern for a developing abscess.  Upon my evaluation, there is no drainable collection present.  Given that you are having pain with some extremely mild overlying skin changes concerning for developing cellulitis for abscess, will cover for same with doxycycline which is an antibiotic.  You need to fill and take this as prescribed in its entirety.  I would like you to have the area rechecked in 2-3 days. This can be done by any doctor's office, urgent care, or emergency department to ensure that you are not developing an abscess.  In the interim, I recommend that you take Tylenol/ibuprofen for pain.  Please use acetaminophen (Tylenol) or ibuprofen (Advil, Motrin) for pain.  You may use 800 mg ibuprofen every 6 hours or 1000 mg of acetaminophen every 6 hours.  You may choose to alternate between the two, this would be most effective. Do not exceed 4000 mg of acetaminophen within 24 hours.  Do not exceed 3200 mg ibuprofen within 24 hours.  I recommend warm compresses and Epsom salt baths.  You can also purchase a doughnut cushion to help relieve pressure in this area.  You can get this online.  You can use an antiseptic (chlorhexidine) soap from the pharmacy 1-2 x per month in the areas where abscesses are most likely to form (armpits, buttocks, groin). This soap can dry your skin out so use it sparingly. Once do so once this area has fully healed.   Continue to monitor how you're doing and return to the ER for new or worsening symptoms.

## 2023-06-09 NOTE — ED Triage Notes (Signed)
Pt reports cyst on her buttox. Reports this woke her up in the middle of the night. Hx of same.

## 2023-06-09 NOTE — ED Provider Notes (Signed)
[place] EMERGENCY DEPARTMENT AT [facility] Provider Note   CSN: [id number] Arrival date & time: 06/09/23  2952     History  Chief Complaint  Patient presents with   Cyst    Margaret Cameron is a 43 y.o. female.  Patient with non-contributory past medical history presents today with concern for abscess.  She states she has had an area in her upper gluteal cleft that is tender to touch.  She first noticed the area a few days ago.  She states that many years ago she used to get recurrent abscesses in this area and had surgical excision and has not had any issues since.  However, she is concerned for recurrence.  She denies fevers or chills.  No trouble with bowel movements.  No drainage from the area.  The history is provided by the patient. No language interpreter was used.       Home Medications Prior to Admission medications   Medication Sig Start Date End Date Taking? Authorizing Provider  albuterol (VENTOLIN HFA) 108 (90 Base) MCG/ACT inhaler Inhale 1-2 puffs into the lungs every 6 (six) hours as needed for wheezing or shortness of breath. 04/14/23   Marita Kansas, PA-C  Ascorbic Acid (VITAMIN C) 1000 MG tablet Take 1,000 mg by mouth daily.    [provider]  doxycycline (VIBRAMYCIN) 100 MG capsule Take 1 capsule (100 mg total) by mouth 2 (two) times daily. 04/14/23   Marita Kansas, PA-C  erythromycin ophthalmic ointment Place a 1/2 inch ribbon of ointment into the lower eyelid of left eye twice daily for 1 week 05/25/22   Raspet, Erin K, PA-C  HYDROcodone bit-homatropine (HYCODAN) 5-1.5 MG/5ML syrup Take 5 mLs by mouth every 6 (six) hours as needed for cough. 04/14/23   Marita Kansas, PA-C  ibuprofen (ADVIL) 600 MG tablet Take 1 tablet (600 mg total) by mouth every 6 (six) hours as needed. 07/07/22   Fayrene Helper, PA-C  ondansetron (ZOFRAN ODT) 4 MG disintegrating tablet Take 1 tablet (4 mg total) by mouth every 8 (eight) hours as needed for nausea or vomiting.  02/12/20   Jacalyn Lefevre, MD  ondansetron (ZOFRAN) 4 MG tablet Take 1 tablet (4 mg total) by mouth every 6 (six) hours. 07/07/22   Fayrene Helper, PA-C  oxymetazoline (AFRIN) 0.05 % nasal spray Place 2 sprays into both nostrils daily as needed for congestion.    [provider]  polyethylene glycol (MIRALAX / GLYCOLAX) 17 g packet Take 17 g by mouth daily. 08/21/19   Narda Bonds, MD  predniSONE (DELTASONE) 20 MG tablet Take 1 tablet (20 mg total) by mouth daily with breakfast. 04/15/23   Marita Kansas, PA-C  etonogestrel (NEXPLANON) 68 MG IMPL implant 1 each by Subdermal route once. Patient not taking: Reported on 02/12/2020  02/13/20  [provider]      Allergies    Trimethoprim, Codeine, and Septra [bactrim]    Review of Systems   Review of Systems  All other systems reviewed and are negative.   Physical Exam Updated Vital Signs BP 109/75 (BP Location: Right Arm)   Pulse 97   Temp (!) 97.4 F (36.3 C) (Oral)   Resp 19   SpO2 98%  Physical Exam Vitals and nursing note reviewed.  Constitutional:      General: She is not in acute distress.    Appearance: Normal appearance. She is normal weight. She is not ill-appearing, toxic-appearing or diaphoretic.  HENT:     Head: Normocephalic  and atraumatic.  Cardiovascular:     Rate and Rhythm: Normal rate.  Pulmonary:     Effort: Pulmonary effort is normal. No respiratory distress.  Genitourinary:    Comments: Upper left gluteal cleft with extremely mild cellulitic changes without fluctuance or induration.  No significant warmth or tenderness. Musculoskeletal:        General: Normal range of motion.     Cervical back: Normal range of motion.  Skin:    General: Skin is warm and dry.  Neurological:     General: No focal deficit present.     Mental Status: She is alert.  Psychiatric:        Mood and Affect: Mood normal.        Behavior: Behavior normal.     ED Results / Procedures / Treatments   Labs (all labs  ordered are listed, but only abnormal results are displayed) Labs Reviewed - No data to display  EKG None  Radiology No results found.  Procedures Procedures    Medications Ordered in ED Medications  lidocaine-EPINEPHrine (XYLOCAINE W/EPI) 2 %-1:200000 (PF) injection 20 mL (has no administration in time range)    ED Course/ Medical Decision Making/ A&P                                 Medical Decision Making Risk Prescription drug management.   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  She is afebrile, nontoxic-appearing, in no acute distress with reassuring vital signs.  Physical exam reveals left upper gluteal cleft with mild tenderness to palpation without fluctuance or induration.  Extremely mild overlying cellulitic changes.  No drainable collection present.  Recommend warm soaks with Epsom salt.  Will give doxycycline to cover for developing abscess. Evaluation and diagnostic testing in the emergency department does not suggest an emergent condition requiring admission or immediate intervention beyond what has been performed at this time.  Plan for discharge with close PCP follow-up.  Patient is understanding and amenable with plan, educated on red flag symptoms that would prompt immediate return.  Patient discharged in stable condition.  Final Clinical Impression(s) / ED Diagnoses Final diagnoses:  Cellulitis of left buttock    Rx / DC Orders ED Discharge Orders          Ordered    doxycycline (VIBRAMYCIN) 100 MG capsule  2 times daily        06/09/23 0445          An After Visit Summary was printed and given to the patient.     Vear Clock 06/09/23 0448    Glynn Octave, MD 06/09/23 [phone]

## 2023-08-13 ENCOUNTER — Ambulatory Visit (HOSPITAL_COMMUNITY)
Admission: RE | Admit: 2023-08-13 | Discharge: 2023-08-13 | Disposition: A | Discharge status: Home / Self Care | Source: Ambulatory Visit | Attending: Emergency Medicine | Admitting: Emergency Medicine

## 2023-08-13 ENCOUNTER — Encounter (HOSPITAL_COMMUNITY): Payer: Self-pay

## 2023-08-13 VITALS — BP 129/74 | HR 76 | Temp 98.0°F | Resp 18

## 2023-08-13 DIAGNOSIS — M26609 Unspecified temporomandibular joint disorder, unspecified side: Secondary | ICD-10-CM

## 2023-08-13 MED ORDER — BACLOFEN 10 MG PO TABS: 10.0000 mg | ORAL_TABLET | Freq: Three times a day (TID) | ORAL | 0 refills | Status: AC

## 2023-08-13 MED ORDER — NAPROXEN 500 MG PO TABS: 500.0000 mg | ORAL_TABLET | Freq: Two times a day (BID) | ORAL | 0 refills | Status: DC

## 2023-08-13 MED ORDER — KETOROLAC TROMETHAMINE 30 MG/ML IJ SOLN
30.0000 mg | Freq: Once | INTRAMUSCULAR | Status: AC
  Administered 2023-08-13: 30 mg via INTRAMUSCULAR

## 2023-08-13 MED ORDER — KETOROLAC TROMETHAMINE 30 MG/ML IJ SOLN
INTRAMUSCULAR | Status: AC
  Filled 2023-08-13: qty 1

## 2023-08-13 NOTE — Discharge Instructions (Addendum)
 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  It is most likely that this has been caused by clenching and/or grinding your teeth at night while you are sleeping.  I have provided you with some information about this condition as well as treatment options and recommendations to follow-up with your dentist.  To treat this, we provided you with an injection of a strong anti-inflammatory medication called ketorolac.  Tomorrow morning, please begin taking naproxen 500 mg twice daily to continue anti-inflammatory treatment.  I have also provided you with a prescription for a muscle relaxer and antispasmodic called baclofen.  This medication can be taken 3 times daily.  If you find that it makes you too sleepy during the daytime, try cutting the tablet in half during the day and taking a full tablet at bedtime.  You can also take 2 tablets at bedtime if you feel that 1 tablet is not sufficiently relieving the tension in your jaw.  Thank you for visiting [facility] today.

## 2023-08-13 NOTE — ED Provider Notes (Signed)
 [facility]    CSN: [id number] Arrival date & time: 08/13/23  1318    HISTORY   Chief Complaint  Patient presents with   Mouth Injury    My jaw hurts when I open my mouth, chew food, and sometimes when I talk. I can only open my mouth a little bit before I experience a sharp pain like the joints in my jaw are restricted. - Entered by patient   Jaw Pain   HPI Margaret Cameron is a pleasant, 43 y.o. female who presents to urgent care today. My jaw hurts when I open my mouth, chew food, and sometimes when I talk. I can only open my mouth a little bit before I experience a sharp pain like the joints in my jaw are restricted.  Patient states she works at [employer] and that her job is very stressful.  Patient states she is unaware of whether or not she grinds her teeth or clenches her teeth at night.  The history is provided by the patient.   Past Medical History:  Diagnosis Date   Abnormal Pap smear of cervix    LEEP 2011   Anemia    with pregnancy   Chronic headaches    Cyst near coccyx    Hypoglycemia    Migraine    Pilonidal cyst    Pyelonephritis    x 3 episodes   Sickle cell trait [facility])    Patient Active Problem List   Diagnosis Date Noted   Salpingitis 02/06/2021   TOA (tubo-ovarian abscess) 02/06/2021   Abdominal pain 08/18/2019   Emesis 08/18/2019   Hematochezia 08/18/2019   Colitis 08/18/2019   Cervical stenosis (uterine cervix) 09/27/2018   Hematometra 09/27/2018   Severe dysplasia of cervix (CIN III) 08/31/2017   Hx of pyelonephritis 12/09/2012   Past Surgical History:  Procedure Laterality Date   CERVICAL CONE BIOPSY     CERVICAL CONIZATION W/BX  2019   DILATION AND CURETTAGE OF UTERUS  2008   DILATION AND CURETTAGE OF UTERUS N/A 09/27/2018   Procedure: DILATATION AND CURETTAGE;  Surgeon: Adam Phenix, MD;  Location: [facility];  Service: Gynecology;  Laterality: N/A;   LEEP  2011   OTHER SURGICAL HISTORY     Pynlodial cyst  removed from tail bone 2012   OTHER SURGICAL HISTORY  10/2010   removal of cyst near coxxyx   PILONIDAL CYST EXCISION  10/2010   OB History     Gravida  5   Para  2   Term  2   Preterm  0   AB  3   Living  2      SAB  2   IAB  1   Ectopic  0   Multiple  0   Live Births  2          Home Medications    Prior to Admission medications   Medication Sig Start Date End Date Taking? Authorizing Provider  Ascorbic Acid (VITAMIN C) 1000 MG tablet Take 1,000 mg by mouth daily.    [provider]  etonogestrel (NEXPLANON) 68 MG IMPL implant 1 each by Subdermal route once. Patient not taking: Reported on 02/12/2020  02/13/20  [provider]    Family History Family History  Problem Relation Age of Onset   Hypertension Brother    Cancer Maternal Aunt        breast   Social History Social History   Tobacco Use   Smoking  status: Former    Current packs/day: 0.00    Types: Cigarettes    Quit date: 07/14/2014    Years since quitting: 9.0   Smokeless tobacco: Never  Vaping Use   Vaping status: Never Used  Substance Use Topics   Alcohol use: No   Drug use: No   Allergies   Trimethoprim, Codeine, and Septra [bactrim]  Review of Systems Review of Systems Pertinent findings revealed after performing a 14 point review of systems has been noted in the history of present illness.  Physical Exam Vital Signs BP 129/74 (BP Location: Right Arm)   Pulse 76   Temp 98 F (36.7 C) (Oral)   Resp 18   LMP 07/27/2023   SpO2 99%   No data found.  Physical Exam Vitals and nursing note reviewed.  Constitutional:      General: She is not in acute distress.    Appearance: Normal appearance.  HENT:     Head: Normocephalic and atraumatic.   Eyes:     Pupils: Pupils are equal, round, and reactive to light.  Cardiovascular:     Rate and Rhythm: Normal rate and regular rhythm.  Pulmonary:     Effort: Pulmonary effort is normal.     Breath sounds:  Normal breath sounds.  Musculoskeletal:        General: Normal range of motion.     Cervical back: Normal range of motion and neck supple.  Skin:    General: Skin is warm and dry.  Neurological:     General: No focal deficit present.     Mental Status: She is alert and oriented to person, place, and time. Mental status is at baseline.  Psychiatric:        Mood and Affect: Mood normal.        Behavior: Behavior normal.        Thought Content: Thought content normal.        Judgment: Judgment normal.     Visual Acuity Right Eye Distance:   Left Eye Distance:   Bilateral Distance:    Right Eye Near:   Left Eye Near:    Bilateral Near:     UC Couse / Diagnostics / Procedures:     Radiology No results found.  Procedures Procedures (including critical care time) EKG  Pending results:  Labs Reviewed - No data to display  Medications Ordered in UC: Medications  ketorolac (TORADOL) 30 MG/ML injection 30 mg (30 mg Intramuscular Given 08/13/23 1409)    UC Diagnoses / Final Clinical Impressions(s)   I have reviewed the triage vital signs and the nursing notes.  Pertinent labs & imaging results that were available during my care of the patient were reviewed by me and considered in my medical decision making (see chart for details).    Final diagnoses:  TMJ (temporomandibular joint disorder)   Patient provided with send for treatment of inflammation and baclofen for relaxation of spastic muscles of left temporomandibular joint.  Patient further advised to reach out to her dentist to discuss possible dental appliance to reduce grinding and clenching at night.  Conservative care recommended.  Return precautions advised.  Please see discharge instructions below for details of plan of care as provided to patient. ED Prescriptions     Medication Sig Dispense Auth. Provider   naproxen (NAPROSYN) 500 MG tablet Take 1 tablet (500 mg total) by mouth 2 (two) times daily. 30 tablet  Theadora Rama Scales, PA-C   baclofen (LIORESAL) 10 MG tablet  Take 1 tablet (10 mg total) by mouth 3 (three) times daily for 7 days. 21 tablet Theadora Rama Scales, PA-C      PDMP not reviewed this encounter.  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  It is most likely that this has been caused by clenching and/or grinding your teeth at night while you are sleeping.  I have provided you with some information about this condition as well as treatment options and recommendations to follow-up with your dentist.  To treat this, we provided you with an injection of a strong anti-inflammatory medication called ketorolac.  Tomorrow morning, please begin taking naproxen 500 mg twice daily to continue anti-inflammatory treatment.  I have also provided you with a prescription for a muscle relaxer and antispasmodic called baclofen.  This medication can be taken 3 times daily.  If you find that it makes you too sleepy during the daytime, try cutting the tablet in half during the day and taking a full tablet at bedtime.  You can also take 2 tablets at bedtime if you feel that 1 tablet is not sufficiently relieving the tension in your jaw.  Thank you for visiting [facility] today.      Disposition Upon Discharge:  Condition: stable for discharge home  Patient presented with an acute illness with associated systemic symptoms and significant discomfort requiring urgent management. In my opinion, this is a condition that a prudent lay person (someone who possesses an average knowledge of health and medicine) may potentially expect to result in complications if not addressed urgently such as respiratory distress, impairment of bodily function or dysfunction of bodily organs.   Routine symptom specific, illness  specific and/or disease specific instructions were discussed with the patient and/or caregiver at length.   As such, the patient has been evaluated and assessed, work-up was performed and treatment was provided in alignment with urgent care protocols and evidence based medicine.  Patient/parent/caregiver has been advised that the patient may require follow up for further testing and treatment if the symptoms continue in spite of treatment, as clinically indicated and appropriate.  Patient/parent/caregiver has been advised to return to the [facility] or PCP if no better; to PCP or the Emergency Department if new signs and symptoms develop, or if the current signs or symptoms continue to change or worsen for further workup, evaluation and treatment as clinically indicated and appropriate  The patient will follow up with their current PCP if and as advised. If the patient does not currently have a PCP we will assist them in obtaining one.   The patient may need specialty follow up if the symptoms continue, in spite of conservative treatment and management, for further workup, evaluation, consultation and treatment as clinically indicated and appropriate.  Patient/parent/caregiver verbalized understanding and agreement of plan as discussed.  All questions were addressed during visit.  Please see discharge instructions below for further details of plan.  This office note has been dictated using Teaching laboratory technician.  Unfortunately, this method of dictation can sometimes lead to typographical or grammatical errors.  I apologize for your inconvenience in advance if this occurs.  Please do not hesitate to reach out to me if clarification is needed.      Theadora Rama Scales, PA-C 08/14/23 1420

## 2023-08-13 NOTE — ED Triage Notes (Signed)
 Left side jaw pain even when eating Since Friday, denies any injury.

## 2023-10-25 NOTE — ED Provider Notes (Signed)
 Patient placed in First Look pathway, seen and evaluated for chief complaint of not feeling well for the past 4 days. N/V/D, bilateral lower extremity swelling. Reports today at work her coworkers were telling her to go to [facility] and she stood but had a syncopal episode, and EMS was called. States she is having a little bit of chest pain and struck her head on a metal cabinet.   Pertinent exam findings include non-toxic in appearance and speech is clear. Based on initial evaluation, labs are currently indicated and radiology studies are currently indicated as allowed for current processes and treatments as applicable in a triage setting and could be different than if patient were seen in a main treatment area or dependent on labs/imagining after results are displayed.  Patient counseled on process, plan, and necessity for staying for completing the evaluation.   This document serves as a record of services personally performed by Maranda Grate PA-C.    [facility] Emergency Department Provider Note  This document was created using the aid of voice recognition Dragon dictation software.   Provider at Bedside: 10/25/2023 8:20 PM  History   Chief Complaint  Patient presents with  . Loss of Consciousness  . Chest Pain     History of Present Illness  History obtained from:Patient  Margaret Cameron is a 43 y.o. female who presents to the ED with multiple complaints. Patient reports she started having bilateral leg swelling about 5 days ago with associated nausea and diarrhea. She reports one episode of diarrhea per day with some bright red blood in it. Patient denies any diarrhea today. She reports associated lower abdominal discomfort. Patient notes developing some left sided chest pain today at work. She describes it as intermittent, sharp pain that does not radiate. Laughing and deep inspiration makes the pain worse. Patient notes she stood up at work and had a very brief syncopal  episode. She reports hitting her head on the metal cabinet. She denies other injuries. Patient notes associated headache. Patient has taken ibuprofen  for her symptoms. Patient reports feeling better now. She reports recently starting vitamin D supplements and cutting out sugar from her diet over the past few weeks. Patient denies fever, sore throat, cough, shortness of breath, vomiting, urinary symptoms, numbness/tingling, unilateral weakness, or other medical complaints at this time.   Patient's last menstrual period was 10/16/2023 (exact date).  Past Medical History No past medical history on file.  Past Surgical History No past surgical history on file.  Current Medications Home Medications           * ascorbic acid (VITAMIN C) 1,000 mg tablet   * clindamycin  (CLEOCIN  T) 1 % lotion   * ibuprofen  (MOTRIN ) 200 mg tablet   * mupirocin (BACTROBAN) 2 % ointment    Use in nares nightly for 5 nights       Allergies Allergies  Allergen Reactions  . Septra Itching  . Codeine Rash  . Sulfamethoxazole-Trimethoprim Itching, Other (See Comments) and Rash    Other Reaction(s): Rash, Other: while taking IBU and Tramadol ., Rash, Other: while taking IBU and Tramadol .    Family History No family history on file.  Social History Social History   Tobacco Use  . Smoking status: Former  . Smokeless tobacco: Never  Substance Use Topics  . Alcohol use: Yes  . Drug use: Never     Physical Exam   BP 109/75   Pulse 60   Temp 98.1 F (36.7 C) (Oral)  Resp 16   Ht 167.6 cm (5' 6)   Wt 89.8 kg (198 lb)   LMP 10/16/2023 (Exact Date)   SpO2 99%   BMI 31.96 kg/m   Physical Exam Vitals and nursing note reviewed.  Constitutional:      General: She is not in acute distress.    Appearance: She is well-developed. She is not toxic-appearing or diaphoretic.  HENT:     Head: Normocephalic and atraumatic.     Right Ear: External ear normal.     Left Ear: External ear normal.  Eyes:      General: Lids are normal.        Right eye: No discharge.        Left eye: No discharge.     Extraocular Movements: Extraocular movements intact.     Pupils: Pupils are equal, round, and reactive to light.  Cardiovascular:     Rate and Rhythm: Normal rate and regular rhythm.     Heart sounds: Normal heart sounds.  Pulmonary:     Effort: Pulmonary effort is normal.     Breath sounds: Normal breath sounds. No wheezing, rhonchi or rales.  Abdominal:     Palpations: Abdomen is soft.     Tenderness: There is no abdominal tenderness.  Musculoskeletal:     Cervical back: Normal range of motion and neck supple.  Skin:    General: Skin is warm.  Neurological:     General: No focal deficit present.     Mental Status: She is alert and oriented to person, place, and time.     Cranial Nerves: Cranial nerves 2-12 are intact. No cranial nerve deficit, dysarthria or facial asymmetry.     Sensory: Sensation is intact. No sensory deficit.     Motor: Motor function is intact. No weakness.     Coordination: Coordination is intact. Finger-Nose-Finger Test and Heel to [facility] Test normal.  Psychiatric:        Mood and Affect: Mood normal.        Behavior: Behavior normal.     Labs   Labs Reviewed  COMPREHENSIVE METABOLIC PANEL - Abnormal      Result Value   Sodium 138     Potassium 3.8     Chloride 108 (*)    CO2 23     Anion Gap 7     Glucose, Random 89     Blood Urea Nitrogen (BUN) 9     Creatinine 0.78     eGFR >90     Albumin 4.2     Total Protein 6.4     Bilirubin, Total 0.4     Alkaline Phosphatase (ALP) 45     Aspartate Aminotransferase (AST) 12 (*)    Alanine Aminotransferase (ALT) 12     Calcium 9.3     BUN/Creatinine Ratio      MAGNESIUM - Abnormal   Magnesium 1.8 (*)   CBC WITH DIFFERENTIAL - Abnormal   WBC 6.41     RBC 4.05 (*)    Hemoglobin 12.3     Hematocrit 36.7     Mean Corpuscular Volume (MCV) 90.6     Mean Corpuscular Hemoglobin (MCH) 30.3     Mean  Corpuscular Hemoglobin Conc (MCHC) 33.5     Red Cell Distribution Width (RDW) 13.8     Platelet Count (PLT) 257     Mean Platelet Volume (MPV) 8.3     Neutrophils % 54     Lymphocytes % 40  Monocytes % 5     Eosinophils % 1     Basophils % 1     Neutrophils Absolute 3.40     Lymphocytes # 2.60     Monocytes # 0.30     Eosinophils # 0.10     Basophils # 0.00    D-DIMER, QUANTITATIVE - Abnormal   D-Dimer 720.00 (*)   B-TYPE NATRIURETIC PEPTIDE (BNP) - Normal   B-Type Natriuretic Peptide (BNP) 22    TROPONIN, HIGH SENSITIVE X 2 (0 HOUR BASELINE + 2 HOUR) - Normal   Troponin, High Sensitive <3    URINALYSIS WITH REFLEX TO MICROSCOPIC - Normal   Color, Urine Colorless     Clarity, Urine Clear     Specific Gravity, Urine 1.010     pH, Urine 7.0     Protein, Urine Negative     Glucose, Urine Negative     Ketones, Urine Negative     Bilirubin, Urine Negative     Blood, Urine Negative     Nitrite, Urine Negative     Leukocyte Esterase, Urine Negative     Urobilinogen, Urine Normal     Narrative:    Microscopic not indicated  TROPONIN, HIGH SENSITIVE, 2HR RFX - Normal   Troponin, High Sensitive <3    POC HCG QUALITATIVE, URINE (AH)   HCG, Urine, POC Negative     Internal Control Acceptable     Kit/Device Lot # [id number]     Kit/Device Expiration Date 01/20/2025       EKG   EKG Impression:  (Interpreted by me)  EKG Interpretation :  Sinus rhythm First degree A-V block  Otherwise normal ECG  No previous ECGs available    Hoy Space, PA-C 10/27/2023 4:13 AM   Radiology   Imaging: (Interpreted by me)  Radiology Results (last 72 hours)     Procedure Component Value Units Date/Time   CT Angio Chest Pulmonary Embolism [[id number]] Collected: 10/25/23 2205   Order Status: Completed Updated: 10/25/23 2226   Narrative:     CLINICAL DATA:  Acute nonlocalized abdominal pain, nausea, vomiting. Positive D-dimer, pulmonary embolism.  EXAM: CT ANGIOGRAPHY CHEST  CT  ABDOMEN AND PELVIS WITH CONTRAST  TECHNIQUE: Multidetector CT imaging of the chest was performed using the standard protocol during bolus administration of intravenous contrast. Multiplanar CT image reconstructions and MIPs were obtained to evaluate the vascular anatomy. Multidetector CT imaging of the abdomen and pelvis was performed using the standard protocol during bolus administration of intravenous contrast.  RADIATION DOSE REDUCTION: This exam was performed according to the departmental dose-optimization program which includes automated exposure control, adjustment of the mA and/or kV according to patient size and/or use of iterative reconstruction technique.  CONTRAST:  100 mL iohexoL  (OMNIPAQUE ) 350 mg iodine/mL injection (MDV) 100 mL  COMPARISON:  None Available.  FINDINGS: CTA CHEST FINDINGS  Cardiovascular: Satisfactory opacification of the pulmonary arteries to the segmental level. No evidence of pulmonary embolism. Normal heart size. No pericardial effusion.  Mediastinum/Nodes: No enlarged mediastinal, hilar, or axillary lymph nodes. Thyroid gland, trachea, and esophagus demonstrate no significant findings.  Lungs/Pleura: Lungs are clear. No pleural effusion or pneumothorax.  Musculoskeletal: No chest wall abnormality. No acute or significant osseous findings.  Review of the MIP images confirms the above findings.  CT ABDOMEN and PELVIS FINDINGS  Hepatobiliary: No focal liver abnormality is seen. No gallstones, gallbladder wall thickening, or biliary dilatation.  Pancreas: Unremarkable  Spleen: Unremarkable  Adrenals/Urinary Tract: Adrenal glands are unremarkable. Kidneys are  normal, without renal calculi, focal lesion, or hydronephrosis. Bladder is unremarkable.  Stomach/Bowel: Stomach is within normal limits. Appendix appears normal. No evidence of bowel wall thickening, distention, or inflammatory changes.  Vascular/Lymphatic: No significant  vascular findings are present. No enlarged abdominal or pelvic lymph nodes.  Reproductive: A genes enhancing intramural mass within the left uterine body is in keeping with a probable intramural fibroid. The pelvic organs are otherwise unremarkable. Trace free fluid within pelvis may be physiologic in a patient of this age.  Other: No abdominal wall hernia  Musculoskeletal: No acute bone abnormality. No lytic or blastic bone lesion. Osseous structures are age appropriate.  Review of the MIP images confirms the above findings.    Impression:     1. No evidence of pulmonary embolism. No acute intrathoracic findings. 2. No acute intra-abdominal pathology identified. 3. Probable intramural fibroid within the left uterine body. Trace free fluid within the pelvis may be physiologic in a patient of this age.   Electronically Signed   By: Dorethia Molt M.D.   On: 10/25/2023 22:24    CT Abdomen Pelvis W Contrast [[id number]] Collected: 10/25/23 2205   Order Status: Completed Updated: 10/25/23 2226   Narrative:     CLINICAL DATA:  Acute nonlocalized abdominal pain, nausea, vomiting. Positive D-dimer, pulmonary embolism.  EXAM: CT ANGIOGRAPHY CHEST  CT ABDOMEN AND PELVIS WITH CONTRAST  TECHNIQUE: Multidetector CT imaging of the chest was performed using the standard protocol during bolus administration of intravenous contrast. Multiplanar CT image reconstructions and MIPs were obtained to evaluate the vascular anatomy. Multidetector CT imaging of the abdomen and pelvis was performed using the standard protocol during bolus administration of intravenous contrast.  RADIATION DOSE REDUCTION: This exam was performed according to the departmental dose-optimization program which includes automated exposure control, adjustment of the mA and/or kV according to patient size and/or use of iterative reconstruction technique.  CONTRAST:  100 mL iohexoL  (OMNIPAQUE ) 350 mg iodine/mL  injection (MDV) 100 mL  COMPARISON:  None Available.  FINDINGS: CTA CHEST FINDINGS  Cardiovascular: Satisfactory opacification of the pulmonary arteries to the segmental level. No evidence of pulmonary embolism. Normal heart size. No pericardial effusion.  Mediastinum/Nodes: No enlarged mediastinal, hilar, or axillary lymph nodes. Thyroid gland, trachea, and esophagus demonstrate no significant findings.  Lungs/Pleura: Lungs are clear. No pleural effusion or pneumothorax.  Musculoskeletal: No chest wall abnormality. No acute or significant osseous findings.  Review of the MIP images confirms the above findings.  CT ABDOMEN and PELVIS FINDINGS  Hepatobiliary: No focal liver abnormality is seen. No gallstones, gallbladder wall thickening, or biliary dilatation.  Pancreas: Unremarkable  Spleen: Unremarkable  Adrenals/Urinary Tract: Adrenal glands are unremarkable. Kidneys are normal, without renal calculi, focal lesion, or hydronephrosis. Bladder is unremarkable.  Stomach/Bowel: Stomach is within normal limits. Appendix appears normal. No evidence of bowel wall thickening, distention, or inflammatory changes.  Vascular/Lymphatic: No significant vascular findings are present. No enlarged abdominal or pelvic lymph nodes.  Reproductive: A genes enhancing intramural mass within the left uterine body is in keeping with a probable intramural fibroid. The pelvic organs are otherwise unremarkable. Trace free fluid within pelvis may be physiologic in a patient of this age.  Other: No abdominal wall hernia  Musculoskeletal: No acute bone abnormality. No lytic or blastic bone lesion. Osseous structures are age appropriate.  Review of the MIP images confirms the above findings.    Impression:     1. No evidence of pulmonary embolism. No acute intrathoracic findings. 2. No acute  intra-abdominal pathology identified. 3. Probable intramural fibroid within the left uterine  body. Trace free fluid within the pelvis may be physiologic in a patient of this age.   Electronically Signed   By: Dorethia Molt M.D.   On: 10/25/2023 22:24    CT Head WO Contrast [[id number]] Collected: 10/25/23 1650   Order Status: Completed Updated: 10/25/23 1704   Narrative:     CLINICAL DATA:  Provided history: Head trauma, moderate/severe. Additional history provided: Syncopal episode (with head trauma).  EXAM: CT HEAD WITHOUT CONTRAST  TECHNIQUE: Contiguous axial images were obtained from the base of the skull through the vertex without intravenous contrast.  RADIATION DOSE REDUCTION: This exam was performed according to the departmental dose-optimization program which includes automated exposure control, adjustment of the mA and/or kV according to patient size and/or use of iterative reconstruction technique.  COMPARISON:  Head CT 02/12/2020.  FINDINGS: Brain:  No age-advanced or lobar predominant cerebral atrophy.  There is no acute intracranial hemorrhage.  No demarcated cortical infarct.  No extra-axial fluid collection.  No evidence of an intracranial mass.  No midline shift.  Vascular: No hyperdense vessel.  Skull: No calvarial fracture or aggressive osseous lesion.  Sinuses/Orbits: No mass or acute finding within the imaged orbits. No significant paranasal sinus disease at the imaged levels.    Impression:     No evidence of an acute intracranial abnormality.   Electronically Signed   By: Rockey Childs D.O.   On: 10/25/2023 17:02    XR Chest 2 Views [[id number]] Collected: 10/25/23 1700   Order Status: Completed Updated: 10/25/23 1703   Narrative:     CLINICAL DATA:  Chest pain.  EXAM: CHEST - 2 VIEW  COMPARISON:  Chest two views 04/14/2023  FINDINGS: Cardiac silhouette and mediastinal contours are within normal limits. The lungs are clear. No pleural effusion or pneumothorax. Mild multilevel degenerative disc changes of the  thoracic spine.    Impression:     No active cardiopulmonary disease.   Electronically Signed   By: Tanda Lyons M.D.   On: 10/25/2023 17:01          Medical Decision Making   Additional Information: I reviewed the patient's past medical history, including notes from our facility and what is available in CareEverywhere.  External records were reviewed: I reviewed outside ED note from 06/09/23 when patient was seen for cellulitis  Differentials considered: DDx: arrhythmia, cardiomyopathy, aortic dissection, ruptured AAA, ruptured ectopic pregnancy, SAH, ACS, PE, seizure, TIA, vertigo, metabolic disorder, intoxication, psychogenic pseudosyncope, vasovagal syncope, hyperventilation, orthostatic syncope and POTS   Patient hooked up to cardiac telemetry for close monitoring.    Clinical Complexity:  Patient's presentation is most consistent with acute presentation with potential threat to life or bodily function.   Medical Decision Making Patient is a 43 year old female who presented to the ED with multiple complaints.  She reports bilateral leg swelling for 5 days, nausea, vomiting, diarrhea.  Patient also reports left-sided chest pain and a brief syncopal episode at work today.  She reports associated dizziness/lightheadedness with standing up.  She denies any of those complaints at present.  On exam, patient is afebrile, nontoxic-appearing, with stable vital signs.  She is not orthostatic.  Neurological exam is unremarkable.  Lungs are clear to auscultation bilaterally.  I have independently reviewed her EKG which shows normal sinus rhythm with first-degree AV block.  No acute ischemic changes.  I independently reviewed her chest x-ray which shows no concern for pneumonia, pneumothorax, or  pulmonary edema.  CBC and CMP are unremarkable.  No significant electrolyte abnormalities or anemia.  Urine pregnancy test negative.  She has had 2 negative and flat troponins and I do not suspect  ACS.  BNP within normal limits lowering any suspicion for CHF.  UA unremarkable with no concern for urinary tract infection.  D-dimer mildly elevated so CTA will be obtained for further evaluation/to rule out PE.  CT head was obtained in the triage process which shows no concern for intracranial hemorrhage, mass lesion, or infarct.  Again, neurological exam is unremarkable and I do not suspect posterior stroke.  CTA chest shows no concern for PE or other acute abnormality.  CT abdomen and pelvis shows intramural fibroid which I have made patient aware of and advised her to follow-up with OB/GYN.  Patient was given fluids here in the ED with some improvement in her symptoms.  I do believe she is stable for discharge home.  I recommended close PCP follow-up Patient ready for d/c. Patient discharged home in stable condition. Parent/patient agrees with treatment plan and disposition. All questions answered. Strict ED return precautions dicussed with parent/patient. Reviewed discharge instructions in detail. Parent/patient verbalizes understanding and agrees with the plan and has no further questions or concerns.    Problems Addressed: Chest pain, unspecified type: complicated acute illness or injury Leg swelling: complicated acute illness or injury Nausea vomiting and diarrhea: complicated acute illness or injury Syncope, unspecified syncope type: complicated acute illness or injury Uterine leiomyoma, unspecified location: complicated acute illness or injury  Amount and/or Complexity of Data Reviewed Labs: ordered. Radiology: ordered and independent interpretation performed. ECG/medicine tests: ordered and independent interpretation performed.  Risk OTC drugs. Prescription drug management.     Diagnosis:  1. Syncope, unspecified syncope type   2. Nausea vomiting and diarrhea   3. Chest pain, unspecified type   4. Leg swelling   5. Uterine leiomyoma, unspecified location      Medication List      ASK your doctor about these medications    ascorbic acid 1,000 mg tablet Commonly known as: VITAMIN C Take 1,000 mg by mouth Once Daily.   clindamycin  1 % lotion Commonly known as: CLEOCIN  T APPLY SMALL AMOUNT TO AFFECTED AREA TWICE A DAY TO INFECTED CYST UNDER RIGHT AXILLA FOR 10 DAYS. REPEAT IF REOCCURS.   ibuprofen  200 mg tablet Commonly known as: MOTRIN  Take 600 mg by mouth.   mupirocin 2 % ointment Commonly known as: BACTROBAN Use in nares nightly for 5 nights       FOLLOW UP [facility] -  EMERGENCY DEPARTMENT [address] [place]  [address] [phone] Go to  As needed    (Please note that portions of this note have been completed with Dragon voice recognition software.  Efforts were made to correct any errors, but occasionally words are mis-transcribed.)  *Some images could not be shown.

## 2023-10-25 NOTE — ED Notes (Signed)
 During ortho VS, pt stated they were a little dizzy. Pt was swaying back and forth slightly and shaking.    Rosina Nat LITTIE Polo, CNA 10/25/23 2032

## 2023-12-24 ENCOUNTER — Encounter (HOSPITAL_COMMUNITY): Payer: Self-pay

## 2023-12-24 ENCOUNTER — Ambulatory Visit (HOSPITAL_COMMUNITY)
Admission: EM | Admit: 2023-12-24 | Discharge: 2023-12-24 | Disposition: A | Discharge status: Home / Self Care | Attending: Nurse Practitioner | Admitting: Nurse Practitioner

## 2023-12-24 DIAGNOSIS — R110 Nausea: Secondary | ICD-10-CM

## 2023-12-24 DIAGNOSIS — R109 Unspecified abdominal pain: Secondary | ICD-10-CM

## 2023-12-24 DIAGNOSIS — K529 Noninfective gastroenteritis and colitis, unspecified: Secondary | ICD-10-CM

## 2023-12-24 DIAGNOSIS — Z3202 Encounter for pregnancy test, result negative: Secondary | ICD-10-CM

## 2023-12-24 DIAGNOSIS — R1033 Periumbilical pain: Secondary | ICD-10-CM

## 2023-12-24 DIAGNOSIS — R197 Diarrhea, unspecified: Secondary | ICD-10-CM

## 2023-12-24 DIAGNOSIS — Z87891 Personal history of nicotine dependence: Secondary | ICD-10-CM | POA: Diagnosis not present

## 2023-12-24 LAB — POCT URINE PREGNANCY: Preg Test, Ur: NEGATIVE

## 2023-12-24 LAB — CBC WITH DIFFERENTIAL/PLATELET
Abs Immature Granulocytes: 0.01 K/uL (ref 0.00–0.07)
Basophils Absolute: 0 K/uL (ref 0.0–0.1)
Basophils Relative: 1 %
Eosinophils Absolute: 0 K/uL (ref 0.0–0.5)
Eosinophils Relative: 0 %
HCT: 41.6 % (ref 36.0–46.0)
Hemoglobin: 14 g/dL (ref 12.0–15.0)
Immature Granulocytes: 0 %
Lymphocytes Relative: 24 %
Lymphs Abs: 1.8 K/uL (ref 0.7–4.0)
MCH: 29.9 pg (ref 26.0–34.0)
MCHC: 33.7 g/dL (ref 30.0–36.0)
MCV: 88.7 fL (ref 80.0–100.0)
Monocytes Absolute: 0.4 K/uL (ref 0.1–1.0)
Monocytes Relative: 5 %
Neutro Abs: 5.3 K/uL (ref 1.7–7.7)
Neutrophils Relative %: 70 %
Platelets: 233 K/uL (ref 150–400)
RBC: 4.69 MIL/uL (ref 3.87–5.11)
RDW: 13.7 % (ref 11.5–15.5)
WBC: 7.6 K/uL (ref 4.0–10.5)
nRBC: 0 % (ref 0.0–0.2)

## 2023-12-24 LAB — POCT URINALYSIS DIP (MANUAL ENTRY)
Bilirubin, UA: NEGATIVE
Glucose, UA: NEGATIVE mg/dL
Ketones, POC UA: NEGATIVE mg/dL
Leukocytes, UA: NEGATIVE
Nitrite, UA: NEGATIVE
Protein Ur, POC: NEGATIVE mg/dL
Spec Grav, UA: 1.02 (ref 1.010–1.025)
Urobilinogen, UA: 0.2 U/dL
pH, UA: 7 (ref 5.0–8.0)

## 2023-12-24 LAB — COMPREHENSIVE METABOLIC PANEL WITH GFR
ALT: 13 U/L (ref 0–44)
AST: 19 U/L (ref 15–41)
Albumin: 4.1 g/dL (ref 3.5–5.0)
Alkaline Phosphatase: 48 U/L (ref 38–126)
Anion gap: 9 (ref 5–15)
BUN: 10 mg/dL (ref 6–20)
CO2: 23 mmol/L (ref 22–32)
Calcium: 9.5 mg/dL (ref 8.9–10.3)
Chloride: 106 mmol/L (ref 98–111)
Creatinine, Ser: 0.85 mg/dL (ref 0.44–1.00)
GFR, Estimated: >60 mL/min (ref >60)
Glucose, Bld: 67 mg/dL — ABNORMAL LOW (ref 70–99)
Potassium: 4.2 mmol/L (ref 3.5–5.1)
Sodium: 138 mmol/L (ref 135–145)
Total Bilirubin: 0.6 mg/dL (ref 0.0–1.2)
Total Protein: 7.2 g/dL (ref 6.5–8.1)

## 2023-12-24 LAB — LIPASE, BLOOD: Lipase: 32 U/L (ref 11–51)

## 2023-12-24 MED ORDER — ALUM & MAG HYDROXIDE-SIMETH 200-200-20 MG/5ML PO SUSP
30.0000 mL | Freq: Once | ORAL | Status: AC
  Administered 2023-12-24: 30 mL via ORAL

## 2023-12-24 MED ORDER — ONDANSETRON 8 MG PO TBDP: 8.0000 mg | ORAL_TABLET | Freq: Three times a day (TID) | ORAL | 0 refills | Status: AC | PRN

## 2023-12-24 MED ORDER — KETOROLAC TROMETHAMINE 30 MG/ML IJ SOLN
INTRAMUSCULAR | Status: AC
  Filled 2023-12-24: qty 1

## 2023-12-24 MED ORDER — KETOROLAC TROMETHAMINE 30 MG/ML IJ SOLN
30.0000 mg | Freq: Once | INTRAMUSCULAR | Status: AC
  Administered 2023-12-24: 30 mg via INTRAVENOUS

## 2023-12-24 MED ORDER — FAMOTIDINE 20 MG PO TABS
ORAL_TABLET | ORAL | Status: AC
  Filled 2023-12-24: qty 1

## 2023-12-24 MED ORDER — KETOROLAC TROMETHAMINE 60 MG/2ML IM SOLN
INTRAMUSCULAR | Status: AC
  Filled 2023-12-24: qty 2

## 2023-12-24 MED ORDER — DICYCLOMINE HCL 20 MG PO TABS: 20.0000 mg | ORAL_TABLET | Freq: Four times a day (QID) | ORAL | 0 refills | Status: AC | PRN

## 2023-12-24 MED ORDER — METOCLOPRAMIDE HCL 5 MG/ML IJ SOLN
INTRAMUSCULAR | Status: AC
  Filled 2023-12-24: qty 2

## 2023-12-24 MED ORDER — ALUM & MAG HYDROXIDE-SIMETH 200-200-20 MG/5ML PO SUSP
ORAL | Status: AC
  Filled 2023-12-24: qty 30

## 2023-12-24 MED ORDER — METOCLOPRAMIDE HCL 5 MG/ML IJ SOLN: 10.0000 mg | Freq: Once | INTRAMUSCULAR | Status: DC

## 2023-12-24 MED ORDER — METOCLOPRAMIDE HCL 5 MG/ML IJ SOLN
10.0000 mg | Freq: Once | INTRAMUSCULAR | Status: AC
  Administered 2023-12-24: 10 mg via INTRAVENOUS

## 2023-12-24 MED ORDER — KETOROLAC TROMETHAMINE 60 MG/2ML IM SOLN: 60.0000 mg | Freq: Once | INTRAMUSCULAR | Status: DC

## 2023-12-24 MED ORDER — FAMOTIDINE 20 MG PO TABS: 20.0000 mg | ORAL_TABLET | Freq: Every day | ORAL | 0 refills | Status: AC

## 2023-12-24 MED ORDER — LIDOCAINE VISCOUS HCL 2 % MT SOLN
OROMUCOSAL | Status: AC
Start: 2023-12-24 — End: 2023-12-24
  Filled 2023-12-24: qty 15

## 2023-12-24 MED ORDER — SODIUM CHLORIDE 0.9 % IV BOLUS
1000.0000 mL | Freq: Once | INTRAVENOUS | Status: AC
  Administered 2023-12-24: 1000 mL via INTRAVENOUS

## 2023-12-24 MED ORDER — LIDOCAINE VISCOUS HCL 2 % MT SOLN
15.0000 mL | Freq: Once | OROMUCOSAL | Status: AC
  Administered 2023-12-24: 15 mL via OROMUCOSAL

## 2023-12-24 MED ORDER — FAMOTIDINE 20 MG PO TABS
20.0000 mg | ORAL_TABLET | Freq: Once | ORAL | Status: AC
  Administered 2023-12-24: 20 mg via ORAL

## 2023-12-24 NOTE — ED Provider Notes (Signed)
 [facility]    CSN: [id number] Arrival date & time: 12/24/23  1006      History   Chief Complaint Chief Complaint  Patient presents with   Diarrhea   Flank Pain    HPI Margaret Cameron is a 43 y.o. female.   Discussed the use of AI scribe software for clinical note transcription with the patient, who gave verbal consent to proceed.   Patient presents with acute onset of abdominal pain, diarrhea, and nausea that began at 3 AM today. She has a history of probably colitis diagnosed a few months ago during an ER visit. The patient reports a sharp, cramping pain in her central abdominal area that comes in waves. The pain lasts until she has a bowel movement. She describes her diarrhea as mucus-like and notes that she needs to use the bathroom anytime she eats or drinks. The patient has been experiencing sweating due to the severity of the pain. She denies vomiting but mentions nausea and a lack of appetite. The patient took Pepto-Bismol liquid, which helped calm her stomach somewhat. The onset of symptoms appears to be related to a meal the patient had last night, which included grilled hamburger meat that may not have been fully cooked, along with salad and fruit. Her husband, who ate the same meal but consumed less meat, also experienced some diarrhea but recovered quickly. The patient reports no chest pain, shortness of breath, or lower back pain. She denies pain or burning with urination but mentions difficulty initiating urination. Her last menstrual period was on July 23rd, and she is not using any form of birth control.  Regarding recent medical history, the patient had an ER visit about 4 months ago for stomach problems, where she was told she possibly had colitis. She also had an episode of nausea and diarrhea on June 4th. In February 2024, she was treated for possible colitis with pain medication and Zofran , which provided relief.  The following portions of the  patient's history were reviewed and updated as appropriate: allergies, current medications, past family history, past medical history, past social history, past surgical history, and problem list.      Past Medical History:  Diagnosis Date   Abnormal Pap smear of cervix    LEEP 2011   Anemia    with pregnancy   Chronic headaches    Cyst near coccyx    Hypoglycemia    Migraine    Pilonidal cyst    Pyelonephritis    x 3 episodes   Sickle cell trait [facility])     Patient Active Problem List   Diagnosis Date Noted   Salpingitis 02/06/2021   TOA (tubo-ovarian abscess) 02/06/2021   Abdominal pain 08/18/2019   Emesis 08/18/2019   Hematochezia 08/18/2019   Colitis 08/18/2019   Cervical stenosis (uterine cervix) 09/27/2018   Hematometra 09/27/2018   Severe dysplasia of cervix (CIN III) 08/31/2017   Hx of pyelonephritis 12/09/2012    Past Surgical History:  Procedure Laterality Date   CERVICAL CONE BIOPSY     CERVICAL CONIZATION W/BX  2019   DILATION AND CURETTAGE OF UTERUS  2008   DILATION AND CURETTAGE OF UTERUS N/A 09/27/2018   Procedure: DILATATION AND CURETTAGE;  Surgeon: Eveline Lynwood MATSU, MD;  Location: [facility];  Service: Gynecology;  Laterality: N/A;   LEEP  2011   OTHER SURGICAL HISTORY     Pynlodial cyst removed from tail bone 2012   OTHER SURGICAL HISTORY  10/2010   removal  of cyst near coxxyx   PILONIDAL CYST EXCISION  10/2010    OB History     Gravida  5   Para  2   Term  2   Preterm  0   AB  3   Living  2      SAB  2   IAB  1   Ectopic  0   Multiple  0   Live Births  2            Home Medications    Prior to Admission medications   Medication Sig Start Date End Date Taking? Authorizing Provider  Cholecalciferol 25 MCG (1000 UT) CHEW Chew 1 tablet by mouth daily. 09/21/23  Yes [provider]  dicyclomine  (BENTYL ) 20 MG tablet Take 1 tablet (20 mg total) by mouth 4 (four) times daily as needed (abdominal discomfort). 12/24/23  Yes  Olliver Boyadjian, FNP  famotidine  (PEPCID ) 20 MG tablet Take 1 tablet (20 mg total) by mouth daily. 12/24/23  Yes Nasteho Glantz, FNP  ibuprofen  (ADVIL ) 200 MG tablet Take 600 mg by mouth. 04/23/22  Yes [provider]  ondansetron  (ZOFRAN -ODT) 8 MG disintegrating tablet Take 1 tablet (8 mg total) by mouth every 8 (eight) hours as needed for nausea or vomiting. 12/24/23  Yes Iola Lukes, FNP  etonogestrel (NEXPLANON) 68 MG IMPL implant 1 each by Subdermal route once. Patient not taking: Reported on 02/12/2020  02/13/20  [provider]    Family History Family History  Problem Relation Age of Onset   Hypertension Brother    Cancer Maternal Aunt        breast    Social History Social History   Tobacco Use   Smoking status: Former    Current packs/day: 0.00    Types: Cigarettes    Quit date: 07/14/2014    Years since quitting: 9.4   Smokeless tobacco: Never  Vaping Use   Vaping status: Never Used  Substance Use Topics   Alcohol use: No   Drug use: No     Allergies   Trimethoprim, Codeine, and Septra [bactrim]   Review of Systems Review of Systems  Constitutional:  Positive for diaphoresis. Negative for appetite change and fever.  Respiratory:  Negative for shortness of breath.   Cardiovascular:  Negative for chest pain.  Gastrointestinal:  Positive for abdominal pain, diarrhea and nausea. Negative for vomiting.  Genitourinary:  Positive for flank pain (left). Negative for dysuria and menstrual problem (LMP 12/13/23).  Musculoskeletal:  Negative for back pain.  All other systems reviewed and are negative.    Physical Exam Triage Vital Signs ED Triage Vitals  Encounter Vitals Group     BP 12/24/23 1033 100/66     Girls Systolic BP Percentile --      Girls Diastolic BP Percentile --      Boys Systolic BP Percentile --      Boys Diastolic BP Percentile --      Pulse Rate 12/24/23 1033 70     Resp 12/24/23 1033 18     Temp 12/24/23 1033 98.1 F  (36.7 C)     Temp Source 12/24/23 1033 Oral     SpO2 12/24/23 1033 98 %     Weight 12/24/23 1033 194 lb (88 kg)     Height 12/24/23 1033 5' 6 (1.676 m)     Head Circumference --      Peak Flow --      Pain Score 12/24/23 1031 8  Pain Loc --      Pain Education --      Exclude from Growth Chart --    No data found.  Updated Vital Signs BP 100/66 (BP Location: Right Arm)   Pulse 70   Temp 98.1 F (36.7 C) (Oral)   Resp 18   Ht 5' 6 (1.676 m)   Wt 194 lb (88 kg)   LMP 12/13/2023 (Approximate)   SpO2 98%   BMI 31.31 kg/m   Visual Acuity Right Eye Distance:   Left Eye Distance:   Bilateral Distance:    Right Eye Near:   Left Eye Near:    Bilateral Near:     Physical Exam Vitals reviewed.  Constitutional:      General: She is awake. She is not in acute distress.    Appearance: Normal appearance. She is well-developed. She is not ill-appearing, toxic-appearing or diaphoretic.     Comments: Patient is obviously uncomfortable but in no acute distress   HENT:     Head: Normocephalic.     Right Ear: Hearing normal.     Left Ear: Hearing normal.     Nose: Nose normal.     Mouth/Throat:     Mouth: Mucous membranes are moist.  Eyes:     General: Vision grossly intact.     Conjunctiva/sclera: Conjunctivae normal.  Cardiovascular:     Rate and Rhythm: Normal rate and regular rhythm.     Heart sounds: Normal heart sounds.  Pulmonary:     Effort: Pulmonary effort is normal.     Breath sounds: Normal breath sounds and air entry.  Abdominal:     General: Bowel sounds are normal. There is no distension.     Palpations: Abdomen is soft.     Tenderness: There is abdominal tenderness in the periumbilical area. There is no right CVA tenderness, left CVA tenderness, guarding or rebound.  Musculoskeletal:        General: Normal range of motion.     Cervical back: Full passive range of motion without pain, normal range of motion and neck supple.  Skin:    General: Skin is  warm and dry.  Neurological:     General: No focal deficit present.     Mental Status: She is alert and oriented to person, place, and time.  Psychiatric:        Speech: Speech normal.        Behavior: Behavior is cooperative.      UC Treatments / Results  Labs (all labs ordered are listed, but only abnormal results are displayed) Labs Reviewed  POCT URINALYSIS DIP (MANUAL ENTRY) - Abnormal; Notable for the following components:      Result Value   Blood, UA trace-intact (*)    All other components within normal limits  COMPREHENSIVE METABOLIC PANEL WITH GFR  LIPASE, BLOOD  CBC WITH DIFFERENTIAL/PLATELET  POCT URINE PREGNANCY    EKG   Radiology No results found.  Procedures Procedures (including critical care time)  Medications Ordered in UC Medications  sodium chloride  0.9 % bolus 1,000 mL (0 mLs Intravenous Stopped 12/24/23 1357)  metoCLOPramide  (REGLAN ) injection 10 mg (10 mg Intravenous Given 12/24/23 1210)  ketorolac  (TORADOL ) 30 MG/ML injection 30 mg (30 mg Intravenous Given 12/24/23 1210)  famotidine  (PEPCID ) tablet 20 mg (20 mg Oral Given 12/24/23 1447)  alum & mag hydroxide-simeth (MAALOX/MYLANTA) 200-200-20 MG/5ML suspension 30 mL (30 mLs Oral Given 12/24/23 1448)  lidocaine  (XYLOCAINE ) 2 % viscous mouth solution 15 mL (15  mLs Mouth/Throat Given 12/24/23 1448)    Initial Impression / Assessment and Plan / UC Course  I have reviewed the triage vital signs and the nursing notes.  Pertinent labs & imaging results that were available during my care of the patient were reviewed by me and considered in my medical decision making (see chart for details).     Patient presents with acute onset of abdominal pain, nausea, and diarrhea beginning early this morning. Pain is described as sharp and cramping in the central abdomen, occurring in waves and temporarily relieved by bowel movements. Diarrhea is mucus-like and occurs after intake of food or fluids. Patient reports recent  consumption of potentially undercooked hamburger meat. No vomiting, fever, or urinary complaints. She has a history of possible colitis, though a CT and labs performed in early June were unremarkable. On exam today, abdominal tenderness is noted without peritoneal signs. Presentation is consistent with acute gastroenteritis, likely foodborne.  Workup includes CBC, CMP, lipase, urinalysis, and urine pregnancy test. Treatment initiated in urgent care includes IV fluid hydration, Toradol  for pain relief, and Reglan  for nausea. Patient will be monitored for clinical response. If symptoms do not improve following treatment or if condition worsens, patient will be referred to the emergency department for further evaluation and imaging.  On reassessment, the patient reports significant improvement. She continues to experience mild reflux but denies any abdominal or flank pain, nausea, vomiting, or diarrhea. She is stable for discharge. Pepcid  and a GI cocktail were administered prior to discharge. She is prescribed Bentyl  and Zofran  as needed, and instructed to take Pepcid  daily for the next few days. A clear liquid diet is recommended for the next 24 hours, avoiding any red-colored juices or gelatin. She may gradually advance to a bland diet as tolerated, and then return to her regular diet. Emergency precautions were thoroughly reviewed with the patient.  The following portions of the patient's history were reviewed and updated as appropriate: allergies, current medications, past family history, past medical history, past social history, past surgical history, and problem list.     Final Clinical Impressions(s) / UC Diagnoses   Final diagnoses:  Periumbilical abdominal pain  Nausea  Left flank pain  Diarrhea, unspecified type  Gastroenteritis     Discharge Instructions      You were evaluated and treated today for symptoms consistent with acute gastroenteritis, likely related to recent food  exposure. You received IV fluids, medication for nausea and pain, and showed improvement before discharge. At this time, you are stable with no significant abdominal pain, nausea, vomiting, or diarrhea.  You have been prescribed Bentyl  for cramping and Zofran  for nausea, both to be used as needed. Take Pepcid  once daily for the next few days to help with reflux symptoms. Start with a clear liquid diet for the next 24 hours--this includes items like broth, water, electrolyte drinks, and clear juices (avoid red-colored juices or gelatin). As tolerated, slowly introduce bland foods such as plain rice, toast, bananas, applesauce, or crackers, and gradually resume your regular diet.  Continue to rest, stay well hydrated, and avoid greasy, spicy, or heavy foods while recovering.   If your symptoms return or worsen--especially if you develop persistent vomiting, severe abdominal pain, fever, bloody stools, or signs of dehydration--seek care in the emergency department.   Follow up with your primary care provider if you do not continue to improve or have concerns about your symptoms.      ED Prescriptions     Medication Sig  Dispense Auth. Provider   dicyclomine  (BENTYL ) 20 MG tablet Take 1 tablet (20 mg total) by mouth 4 (four) times daily as needed (abdominal discomfort). 20 tablet Iola Lukes, FNP   ondansetron  (ZOFRAN -ODT) 8 MG disintegrating tablet Take 1 tablet (8 mg total) by mouth every 8 (eight) hours as needed for nausea or vomiting. 12 tablet Iola Lukes, FNP   famotidine  (PEPCID ) 20 MG tablet Take 1 tablet (20 mg total) by mouth daily. 10 tablet Iola Lukes, FNP      PDMP not reviewed this encounter.   Iola Lukes, [place] 12/24/23 1450

## 2023-12-24 NOTE — Discharge Instructions (Addendum)
 You were evaluated and treated today for symptoms consistent with acute gastroenteritis, likely related to recent food exposure. You received IV fluids, medication for nausea and pain, and showed improvement before discharge. At this time, you are stable with no significant abdominal pain, nausea, vomiting, or diarrhea.  You have been prescribed Bentyl  for cramping and Zofran  for nausea, both to be used as needed. Take Pepcid  once daily for the next few days to help with reflux symptoms. Start with a clear liquid diet for the next 24 hours--this includes items like broth, water, electrolyte drinks, and clear juices (avoid red-colored juices or gelatin). As tolerated, slowly introduce bland foods such as plain rice, toast, bananas, applesauce, or crackers, and gradually resume your regular diet.  Continue to rest, stay well hydrated, and avoid greasy, spicy, or heavy foods while recovering.   If your symptoms return or worsen--especially if you develop persistent vomiting, severe abdominal pain, fever, bloody stools, or signs of dehydration--seek care in the emergency department.   Follow up with your primary care provider if you do not continue to improve or have concerns about your symptoms.

## 2023-12-24 NOTE — ED Triage Notes (Signed)
 Chief Complaint: diarrhea, sharp left flank pain. Having nausea but denies emesis or fever.  States also having trouble urination.   Sick exposure: No  Onset: around 0300 today  Prescriptions or OTC medications tried: Yes- Ibuprofen , Pepto bismol    with little relief  New foods, medications, or products: Yes- Vitamin D pills   Recent Travel: No

## 2023-12-25 ENCOUNTER — Ambulatory Visit (HOSPITAL_COMMUNITY): Payer: Self-pay

## 2024-06-20 ENCOUNTER — Ambulatory Visit (HOSPITAL_COMMUNITY)
